# Patient Record
Sex: Male | Born: 1955
Health system: Southern US, Community
[De-identification: ages and names within clinical notes are randomized; demographics above are authoritative.]

## PROBLEM LIST (undated history)

## (undated) ENCOUNTER — Emergency Department (HOSPITAL_COMMUNITY): Discharge status: Absent without leave | Payer: No Typology Code available for payment source

## (undated) DIAGNOSIS — K219 Gastro-esophageal reflux disease without esophagitis: Secondary | ICD-10-CM

## (undated) DIAGNOSIS — B91 Sequelae of poliomyelitis: Principal | ICD-10-CM

## (undated) DIAGNOSIS — Z5189 Encounter for other specified aftercare: Secondary | ICD-10-CM

## (undated) DIAGNOSIS — I10 Essential (primary) hypertension: Secondary | ICD-10-CM

## (undated) DIAGNOSIS — F1910 Other psychoactive substance abuse, uncomplicated: Secondary | ICD-10-CM

## (undated) DIAGNOSIS — F528 Other sexual dysfunction not due to a substance or known physiological condition: Secondary | ICD-10-CM

## (undated) DIAGNOSIS — F419 Anxiety disorder, unspecified: Secondary | ICD-10-CM

## (undated) DIAGNOSIS — M79672 Pain in left foot: Secondary | ICD-10-CM

## (undated) DIAGNOSIS — T7840XA Allergy, unspecified, initial encounter: Secondary | ICD-10-CM

## (undated) DIAGNOSIS — B182 Chronic viral hepatitis C: Secondary | ICD-10-CM

## (undated) DIAGNOSIS — M79671 Pain in right foot: Secondary | ICD-10-CM

## (undated) HISTORY — PX: KNEE ARTHROSCOPY: SUR90

## (undated) HISTORY — PX: FRACTURE SURGERY: SHX138

## (undated) HISTORY — DX: Sequelae of poliomyelitis: B91

## (undated) HISTORY — DX: Encounter for other specified aftercare: Z51.89

## (undated) HISTORY — DX: Pain in left foot: M79.672

## (undated) HISTORY — PX: COLONOSCOPY: SHX174

## (undated) HISTORY — DX: Pain in right foot: M79.671

## (undated) HISTORY — DX: Allergy, unspecified, initial encounter: T78.40XA

## (undated) HISTORY — DX: Gastro-esophageal reflux disease without esophagitis: K21.9

## (undated) HISTORY — DX: Chronic viral hepatitis C: B18.2

## (undated) HISTORY — DX: Essential (primary) hypertension: I10

## (undated) HISTORY — DX: Other psychoactive substance abuse, uncomplicated: F19.10

## (undated) HISTORY — DX: Anxiety disorder, unspecified: F41.9

## (undated) HISTORY — DX: Other sexual dysfunction not due to a substance or known physiological condition: F52.8

---

## 1999-05-13 ENCOUNTER — Emergency Department (HOSPITAL_COMMUNITY): Admission: EM | Admit: 1999-05-13 | Discharge: 1999-05-13 | Payer: Self-pay

## 1999-06-01 ENCOUNTER — Encounter: Admission: RE | Admit: 1999-06-01 | Discharge: 1999-06-01 | Payer: Self-pay | Admitting: Family Medicine

## 2000-06-07 ENCOUNTER — Encounter: Admission: RE | Admit: 2000-06-07 | Discharge: 2000-06-07 | Payer: Self-pay | Admitting: Family Medicine

## 2000-06-07 ENCOUNTER — Encounter: Payer: Self-pay | Admitting: Family Medicine

## 2000-07-08 ENCOUNTER — Encounter: Admission: RE | Admit: 2000-07-08 | Discharge: 2000-07-08 | Payer: Self-pay | Admitting: Family Medicine

## 2001-03-13 ENCOUNTER — Encounter: Admission: RE | Admit: 2001-03-13 | Discharge: 2001-03-13 | Payer: Self-pay | Admitting: Family Medicine

## 2001-04-21 ENCOUNTER — Encounter: Admission: RE | Admit: 2001-04-21 | Discharge: 2001-04-21 | Payer: Self-pay | Admitting: Sports Medicine

## 2003-04-30 ENCOUNTER — Encounter: Admission: RE | Admit: 2003-04-30 | Discharge: 2003-04-30 | Payer: Self-pay | Admitting: Internal Medicine

## 2003-05-06 ENCOUNTER — Encounter: Admission: RE | Admit: 2003-05-06 | Discharge: 2003-05-06 | Payer: Self-pay | Admitting: Internal Medicine

## 2003-05-13 ENCOUNTER — Encounter: Admission: RE | Admit: 2003-05-13 | Discharge: 2003-05-13 | Payer: Self-pay | Admitting: Internal Medicine

## 2004-11-20 ENCOUNTER — Emergency Department (HOSPITAL_COMMUNITY): Admission: EM | Admit: 2004-11-20 | Discharge: 2004-11-20 | Payer: Self-pay | Admitting: Family Medicine

## 2005-01-08 ENCOUNTER — Encounter: Admission: RE | Admit: 2005-01-08 | Discharge: 2005-03-06 | Payer: Self-pay | Admitting: Neurology

## 2005-03-23 ENCOUNTER — Emergency Department (HOSPITAL_COMMUNITY): Admission: EM | Admit: 2005-03-23 | Discharge: 2005-03-23 | Payer: Self-pay | Admitting: Emergency Medicine

## 2005-03-30 ENCOUNTER — Ambulatory Visit: Payer: Self-pay | Admitting: Internal Medicine

## 2005-07-06 ENCOUNTER — Ambulatory Visit: Payer: Self-pay | Admitting: Internal Medicine

## 2005-07-09 ENCOUNTER — Ambulatory Visit: Payer: Self-pay | Admitting: Internal Medicine

## 2006-06-05 DIAGNOSIS — B182 Chronic viral hepatitis C: Secondary | ICD-10-CM

## 2006-06-05 DIAGNOSIS — B91 Sequelae of poliomyelitis: Secondary | ICD-10-CM

## 2006-06-05 DIAGNOSIS — F528 Other sexual dysfunction not due to a substance or known physiological condition: Secondary | ICD-10-CM

## 2006-06-05 DIAGNOSIS — I10 Essential (primary) hypertension: Secondary | ICD-10-CM

## 2006-06-05 DIAGNOSIS — L84 Corns and callosities: Secondary | ICD-10-CM

## 2006-06-05 HISTORY — DX: Other sexual dysfunction not due to a substance or known physiological condition: F52.8

## 2006-06-05 HISTORY — DX: Sequelae of poliomyelitis: B91

## 2006-06-05 HISTORY — DX: Essential (primary) hypertension: I10

## 2006-06-05 HISTORY — DX: Chronic viral hepatitis C: B18.2

## 2006-08-01 ENCOUNTER — Ambulatory Visit: Payer: Self-pay | Admitting: Internal Medicine

## 2006-08-01 ENCOUNTER — Encounter (INDEPENDENT_AMBULATORY_CARE_PROVIDER_SITE_OTHER): Payer: Self-pay | Admitting: Internal Medicine

## 2006-08-01 LAB — CONVERTED CEMR LAB
AST: 80 units/L — ABNORMAL HIGH (ref 0–37)
Albumin: 4.5 g/dL (ref 3.5–5.2)
Alkaline Phosphatase: 66 units/L (ref 39–117)
BUN: 22 mg/dL (ref 6–23)
Calcium: 9.8 mg/dL (ref 8.4–10.5)
Chloride: 104 meq/L (ref 96–112)
Glucose, Bld: 87 mg/dL (ref 70–99)
Potassium: 4.6 meq/L (ref 3.5–5.3)
Sodium: 142 meq/L (ref 135–145)
Total Protein: 8.3 g/dL (ref 6.0–8.3)

## 2006-12-05 ENCOUNTER — Encounter (INDEPENDENT_AMBULATORY_CARE_PROVIDER_SITE_OTHER): Payer: Self-pay | Admitting: Internal Medicine

## 2006-12-05 ENCOUNTER — Ambulatory Visit: Payer: Self-pay | Admitting: Internal Medicine

## 2007-03-26 ENCOUNTER — Ambulatory Visit: Payer: Self-pay | Admitting: Internal Medicine

## 2007-03-26 ENCOUNTER — Encounter (INDEPENDENT_AMBULATORY_CARE_PROVIDER_SITE_OTHER): Payer: Self-pay | Admitting: *Deleted

## 2007-03-27 LAB — CONVERTED CEMR LAB
BUN: 22 mg/dL (ref 6–23)
Chloride: 107 meq/L (ref 96–112)
Creatinine, Ser: 1.41 mg/dL (ref 0.40–1.50)

## 2007-08-19 ENCOUNTER — Telehealth (INDEPENDENT_AMBULATORY_CARE_PROVIDER_SITE_OTHER): Payer: Self-pay | Admitting: *Deleted

## 2007-08-22 ENCOUNTER — Telehealth (INDEPENDENT_AMBULATORY_CARE_PROVIDER_SITE_OTHER): Payer: Self-pay | Admitting: *Deleted

## 2007-09-19 ENCOUNTER — Telehealth: Payer: Self-pay | Admitting: *Deleted

## 2007-09-25 ENCOUNTER — Telehealth (INDEPENDENT_AMBULATORY_CARE_PROVIDER_SITE_OTHER): Payer: Self-pay | Admitting: *Deleted

## 2007-10-20 ENCOUNTER — Encounter (INDEPENDENT_AMBULATORY_CARE_PROVIDER_SITE_OTHER): Payer: Self-pay | Admitting: *Deleted

## 2007-10-20 ENCOUNTER — Ambulatory Visit: Payer: Self-pay | Admitting: Internal Medicine

## 2007-10-23 ENCOUNTER — Encounter (INDEPENDENT_AMBULATORY_CARE_PROVIDER_SITE_OTHER): Payer: Self-pay | Admitting: *Deleted

## 2007-10-23 ENCOUNTER — Ambulatory Visit: Payer: Self-pay | Admitting: Hospitalist

## 2007-10-24 ENCOUNTER — Telehealth (INDEPENDENT_AMBULATORY_CARE_PROVIDER_SITE_OTHER): Payer: Self-pay | Admitting: *Deleted

## 2007-10-26 LAB — CONVERTED CEMR LAB
ALT: 81 units/L — ABNORMAL HIGH (ref 0–53)
AST: 116 units/L — ABNORMAL HIGH (ref 0–37)
Albumin: 4.2 g/dL (ref 3.5–5.2)
Alkaline Phosphatase: 59 units/L (ref 39–117)
BUN: 14 mg/dL (ref 6–23)
Calcium: 9.4 mg/dL (ref 8.4–10.5)
Chloride: 106 meq/L (ref 96–112)
LDL Cholesterol: 66 mg/dL (ref 0–99)
Potassium: 4.2 meq/L (ref 3.5–5.3)
Sodium: 141 meq/L (ref 135–145)
Total Protein: 7.6 g/dL (ref 6.0–8.3)

## 2007-12-23 ENCOUNTER — Ambulatory Visit: Payer: Self-pay | Admitting: Gastroenterology

## 2008-01-01 ENCOUNTER — Telehealth: Payer: Self-pay | Admitting: Gastroenterology

## 2008-01-05 ENCOUNTER — Ambulatory Visit: Payer: Self-pay | Admitting: Gastroenterology

## 2008-01-15 ENCOUNTER — Ambulatory Visit: Payer: Self-pay | Admitting: Internal Medicine

## 2008-01-15 ENCOUNTER — Encounter (INDEPENDENT_AMBULATORY_CARE_PROVIDER_SITE_OTHER): Payer: Self-pay | Admitting: *Deleted

## 2008-01-19 LAB — CONVERTED CEMR LAB
ALT: 122 units/L — ABNORMAL HIGH (ref 0–53)
AST: 103 units/L — ABNORMAL HIGH (ref 0–37)
Albumin: 4.3 g/dL (ref 3.5–5.2)
CO2: 23 meq/L (ref 19–32)
Calcium: 9.2 mg/dL (ref 8.4–10.5)
Chloride: 109 meq/L (ref 96–112)
Creatinine, Ser: 1.24 mg/dL (ref 0.40–1.50)
Potassium: 3.7 meq/L (ref 3.5–5.3)
Sodium: 145 meq/L (ref 135–145)
Total Protein: 7.5 g/dL (ref 6.0–8.3)

## 2008-01-26 LAB — CONVERTED CEMR LAB: HCV Quantitative: 213000 intl units/mL — ABNORMAL HIGH (ref <43)

## 2008-02-03 ENCOUNTER — Ambulatory Visit (HOSPITAL_COMMUNITY): Admission: RE | Admit: 2008-02-03 | Discharge: 2008-02-03 | Payer: Self-pay | Admitting: *Deleted

## 2008-02-19 ENCOUNTER — Ambulatory Visit: Payer: Self-pay | Admitting: Internal Medicine

## 2008-03-01 ENCOUNTER — Telehealth: Payer: Self-pay | Admitting: Internal Medicine

## 2008-03-23 ENCOUNTER — Emergency Department (HOSPITAL_COMMUNITY): Admission: EM | Admit: 2008-03-23 | Discharge: 2008-03-23 | Payer: Self-pay | Admitting: Emergency Medicine

## 2008-06-29 ENCOUNTER — Encounter (INDEPENDENT_AMBULATORY_CARE_PROVIDER_SITE_OTHER): Payer: Self-pay | Admitting: Internal Medicine

## 2008-06-29 ENCOUNTER — Ambulatory Visit (HOSPITAL_COMMUNITY): Admission: RE | Admit: 2008-06-29 | Discharge: 2008-06-29 | Payer: Self-pay | Admitting: Internal Medicine

## 2008-06-29 ENCOUNTER — Ambulatory Visit: Payer: Self-pay | Admitting: Internal Medicine

## 2008-07-01 LAB — CONVERTED CEMR LAB
ALT: 88 units/L — ABNORMAL HIGH (ref 0–53)
Alkaline Phosphatase: 61 units/L (ref 39–117)
CO2: 28 meq/L (ref 19–32)
Creatinine, Ser: 1.19 mg/dL (ref 0.40–1.50)
Hemoglobin: 13.6 g/dL (ref 13.0–17.0)
INR: 1.1 (ref 0.0–1.5)
MCHC: 32.5 g/dL (ref 30.0–36.0)
Platelets: 259 10*3/uL (ref 150–400)
RDW: 13.7 % (ref 11.5–15.5)
TSH: 2.063 microintl units/mL (ref 0.350–4.50)
Total Bilirubin: 0.4 mg/dL (ref 0.3–1.2)

## 2008-07-19 ENCOUNTER — Encounter (INDEPENDENT_AMBULATORY_CARE_PROVIDER_SITE_OTHER): Payer: Self-pay | Admitting: *Deleted

## 2008-07-19 ENCOUNTER — Telehealth: Payer: Self-pay | Admitting: Infectious Diseases

## 2008-07-19 ENCOUNTER — Telehealth: Payer: Self-pay | Admitting: *Deleted

## 2008-07-19 ENCOUNTER — Ambulatory Visit (HOSPITAL_COMMUNITY): Admission: RE | Admit: 2008-07-19 | Discharge: 2008-07-19 | Payer: Self-pay | Admitting: Infectious Diseases

## 2008-07-19 ENCOUNTER — Ambulatory Visit: Payer: Self-pay | Admitting: Infectious Diseases

## 2008-07-19 ENCOUNTER — Encounter (INDEPENDENT_AMBULATORY_CARE_PROVIDER_SITE_OTHER): Payer: Self-pay | Admitting: Internal Medicine

## 2008-07-19 DIAGNOSIS — F1010 Alcohol abuse, uncomplicated: Secondary | ICD-10-CM | POA: Insufficient documentation

## 2008-07-19 LAB — CONVERTED CEMR LAB
BUN: 16 mg/dL (ref 6–23)
CO2: 26 meq/L (ref 19–32)
Calcium: 9.7 mg/dL (ref 8.4–10.5)
Creatinine, Ser: 1.17 mg/dL (ref 0.40–1.50)
Eosinophils Absolute: 0.1 10*3/uL (ref 0.0–0.7)
Eosinophils Relative: 1 % (ref 0–5)
Glucose, Bld: 86 mg/dL (ref 70–99)
HCT: 41.6 % (ref 39.0–52.0)
Hemoglobin: 13.7 g/dL (ref 13.0–17.0)
Lymphocytes Relative: 34 % (ref 12–46)
Lymphs Abs: 2.1 10*3/uL (ref 0.7–4.0)
MCV: 90.8 fL (ref 78.0–100.0)
Monocytes Absolute: 0.5 10*3/uL (ref 0.1–1.0)
RDW: 13.8 % (ref 11.5–15.5)
WBC: 6 10*3/uL (ref 4.0–10.5)

## 2008-07-21 ENCOUNTER — Ambulatory Visit: Payer: Self-pay | Admitting: Cardiology

## 2008-07-21 ENCOUNTER — Ambulatory Visit: Payer: Self-pay | Admitting: Vascular Surgery

## 2008-07-21 ENCOUNTER — Encounter: Payer: Self-pay | Admitting: Infectious Diseases

## 2008-07-21 ENCOUNTER — Ambulatory Visit (HOSPITAL_COMMUNITY): Admission: RE | Admit: 2008-07-21 | Discharge: 2008-07-21 | Payer: Self-pay | Admitting: Infectious Diseases

## 2008-07-22 ENCOUNTER — Ambulatory Visit (HOSPITAL_COMMUNITY): Admission: RE | Admit: 2008-07-22 | Discharge: 2008-07-22 | Payer: Self-pay | Admitting: Infectious Diseases

## 2008-07-26 ENCOUNTER — Ambulatory Visit: Payer: Self-pay | Admitting: Internal Medicine

## 2008-07-26 ENCOUNTER — Telehealth: Payer: Self-pay | Admitting: Licensed Clinical Social Worker

## 2008-07-26 DIAGNOSIS — F17210 Nicotine dependence, cigarettes, uncomplicated: Secondary | ICD-10-CM | POA: Insufficient documentation

## 2008-07-28 ENCOUNTER — Telehealth (INDEPENDENT_AMBULATORY_CARE_PROVIDER_SITE_OTHER): Payer: Self-pay | Admitting: Internal Medicine

## 2008-08-09 ENCOUNTER — Encounter (INDEPENDENT_AMBULATORY_CARE_PROVIDER_SITE_OTHER): Payer: Self-pay | Admitting: Internal Medicine

## 2008-09-28 ENCOUNTER — Telehealth: Payer: Self-pay | Admitting: *Deleted

## 2008-10-13 ENCOUNTER — Ambulatory Visit: Payer: Self-pay | Admitting: *Deleted

## 2008-10-13 ENCOUNTER — Encounter (INDEPENDENT_AMBULATORY_CARE_PROVIDER_SITE_OTHER): Payer: Self-pay | Admitting: Internal Medicine

## 2008-10-13 DIAGNOSIS — R740 Nonspecific elevation of levels of transaminase and lactic acid dehydrogenase [LDH]: Secondary | ICD-10-CM

## 2008-10-13 DIAGNOSIS — R7401 Elevation of levels of liver transaminase levels: Secondary | ICD-10-CM | POA: Insufficient documentation

## 2008-10-13 LAB — CONVERTED CEMR LAB
ALT: 102 units/L — ABNORMAL HIGH (ref 0–53)
Ammonia: 37 umol/L — ABNORMAL HIGH (ref 11–35)
BUN: 18 mg/dL (ref 6–23)
CO2: 27 meq/L (ref 19–32)
Calcium: 10.1 mg/dL (ref 8.4–10.5)
Chloride: 106 meq/L (ref 96–112)
Creatinine, Ser: 1.29 mg/dL (ref 0.40–1.50)
Glucose, Bld: 81 mg/dL (ref 70–99)

## 2008-10-25 ENCOUNTER — Encounter: Admission: RE | Admit: 2008-10-25 | Discharge: 2008-12-08 | Payer: Self-pay | Admitting: Internal Medicine

## 2008-11-08 ENCOUNTER — Encounter (INDEPENDENT_AMBULATORY_CARE_PROVIDER_SITE_OTHER): Payer: Self-pay | Admitting: Internal Medicine

## 2008-12-01 ENCOUNTER — Encounter (INDEPENDENT_AMBULATORY_CARE_PROVIDER_SITE_OTHER): Payer: Self-pay | Admitting: Internal Medicine

## 2008-12-09 ENCOUNTER — Ambulatory Visit: Payer: Self-pay | Admitting: Gastroenterology

## 2008-12-09 ENCOUNTER — Encounter (INDEPENDENT_AMBULATORY_CARE_PROVIDER_SITE_OTHER): Payer: Self-pay | Admitting: Internal Medicine

## 2008-12-16 ENCOUNTER — Encounter (INDEPENDENT_AMBULATORY_CARE_PROVIDER_SITE_OTHER): Payer: Self-pay | Admitting: Internal Medicine

## 2009-02-10 ENCOUNTER — Ambulatory Visit: Payer: Self-pay | Admitting: Gastroenterology

## 2009-02-10 ENCOUNTER — Encounter: Payer: Self-pay | Admitting: Internal Medicine

## 2009-03-01 ENCOUNTER — Telehealth: Payer: Self-pay | Admitting: *Deleted

## 2009-06-30 ENCOUNTER — Encounter: Payer: Self-pay | Admitting: Internal Medicine

## 2009-06-30 ENCOUNTER — Ambulatory Visit: Payer: Self-pay | Admitting: Internal Medicine

## 2009-09-30 ENCOUNTER — Ambulatory Visit: Payer: Self-pay | Admitting: Internal Medicine

## 2009-09-30 DIAGNOSIS — M62838 Other muscle spasm: Secondary | ICD-10-CM | POA: Insufficient documentation

## 2009-09-30 LAB — CONVERTED CEMR LAB
Chloride: 105 meq/L (ref 96–112)
Creatinine, Ser: 1.23 mg/dL (ref 0.40–1.50)
Potassium: 4 meq/L (ref 3.5–5.3)
Sodium: 141 meq/L (ref 135–145)

## 2009-12-27 ENCOUNTER — Telehealth: Payer: Self-pay | Admitting: Internal Medicine

## 2010-01-27 ENCOUNTER — Ambulatory Visit: Payer: Self-pay | Admitting: Internal Medicine

## 2010-02-17 ENCOUNTER — Ambulatory Visit: Payer: Self-pay | Admitting: Internal Medicine

## 2010-02-22 ENCOUNTER — Telehealth: Payer: Self-pay | Admitting: Licensed Clinical Social Worker

## 2010-02-24 ENCOUNTER — Telehealth: Payer: Self-pay | Admitting: Internal Medicine

## 2010-02-27 ENCOUNTER — Encounter: Payer: Self-pay | Admitting: Licensed Clinical Social Worker

## 2010-02-28 ENCOUNTER — Telehealth: Payer: Self-pay | Admitting: *Deleted

## 2010-03-11 ENCOUNTER — Emergency Department (HOSPITAL_COMMUNITY): Admission: EM | Admit: 2010-03-11 | Discharge: 2010-03-11 | Payer: Self-pay | Admitting: Emergency Medicine

## 2010-03-12 ENCOUNTER — Emergency Department (HOSPITAL_COMMUNITY): Admission: EM | Admit: 2010-03-12 | Discharge: 2010-03-12 | Payer: Self-pay | Admitting: Emergency Medicine

## 2010-03-28 ENCOUNTER — Telehealth: Payer: Self-pay | Admitting: *Deleted

## 2010-05-22 ENCOUNTER — Telehealth: Payer: Self-pay | Admitting: Internal Medicine

## 2010-06-19 ENCOUNTER — Telehealth: Payer: Self-pay | Admitting: *Deleted

## 2010-07-24 ENCOUNTER — Telehealth: Payer: Self-pay | Admitting: Internal Medicine

## 2010-08-24 NOTE — Assessment & Plan Note (Signed)
Summary: EST-3 WEEK RECHECK/CH   Vital Signs:  Patient profile:   55 year old male Height:      72 inches (182.88 cm) Weight:      167.8 pounds (76.27 kg) BMI:     22.84 Temp:     98.1 degrees F oral Pulse rate:   81 / minute BP sitting:   142 / 97  (right arm)  Vitals Entered By: Chinita Pester RN (February 17, 2010 11:39 AM) CC: 2 week f/u. States Flexeril  might be causing h/a's. Is Patient Diabetic? No Pain Assessment Patient in pain? yes     Location: left leg Intensity: 4 Type: dull Onset of pain  Chronic - hx polio Nutritional Status BMI of 19 -24 = normal  Have you ever been in a relationship where you felt threatened, hurt or afraid?No   Does patient need assistance? Functional Status Self care Ambulation Impaired:Risk for fall Comments uses a cane   Primary Care Provider:  Lars Mage MD  CC:  2 week f/u. States Flexeril  might be causing h/a's..  History of Present Illness: Patient is a 55 year old man with PMH as described in EMR is here today for follow up appointment of his pain and HTN. He was last seen 3 weeks ago when he was c/o increased pain in his affected leg and was started on Flexeril which he did not refill earlier.  Patient said that he has been having headaches 2-3/10. He has it everytime he takes his flexeril. The pain is described as aching/niggling type of pain. No radiation. no releiving factors, aggravated by flexeril.  He thinks that the headache is getting better progressively as he is tolerating the drug better.  The Vicodin prescribed for her pain in the leg is working fine and he just took a pill before coming to clinic.   Bp is elevated today. He is taking HCTZ and lisinopril and HCTZ was started recently on last visit.  He is still smoking 10 cigs a day and wants to quit. he is agreeable to see Francene Finders for counselling.   He denies any new sicknesses or hospitalizations, no chest pain episodes, no fevers, no chills, no  abdominal or urinary concerns. No recent changes in appetite, weight.     Depression History:      The patient denies a depressed mood most of the day and a diminished interest in his usual daily activities.         Preventive Screening-Counseling & Management  Alcohol-Tobacco     Alcohol drinks/day: 1     Alcohol type: BEER      Smoking Status: current     Smoking Cessation Counseling: yes     Packs/Day: 1/2     Passive Smoke Exposure: no  Caffeine-Diet-Exercise     Does Patient Exercise: yes     Type of exercise: WALKING     Times/week: 7  Problems Prior to Update: 1)  Degenerative Joint Disease  (ICD-715.90) 2)  Spasm of Muscle  (ICD-728.85) 3)  Preventive Health Care  (ICD-V70.0) 4)  Foot Pain, Bilateral  (ICD-729.5) 5)  Calluses, Feet, Bilateral  (ICD-700) 6)  Transaminases, Serum, Elevated  (ICD-790.4) 7)  Tobacco Abuse  (ICD-305.1) 8)  Alcohol Abuse  (ICD-305.00) 9)  Weakness  (ICD-780.79) 10)  Callus  (ICD-700) 11)  Ankle Pain, Right  (ICD-719.47) 12)  Foot Pain, Left  (ICD-729.5) 13)  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 Take 1 Tablet By Mouth Once A Day 2)  Folic Acid 1 Mg Tabs (Folic Acid) .... Take 1 Tablet By Mouth Once A Day 3)  Lisinopril 20 Mg Tabs (Lisinopril) .... Take 1 Tablet By Mouth Two Times A Day 4)  Vicodin 5-500 Mg Tabs (Hydrocodone-Acetaminophen) .... Take 1 Pill Every 6 Hours As Needed For Pain 5)  Flexeril 10 Mg Tabs (Cyclobenzaprine Hcl) .... Take One Pill By Mouth At Bedtime For 2 Weeks, Then Increase To One Pill By Mouth Two Times A Day 6)  Hydrochlorothiazide 25 Mg Tabs (Hydrochlorothiazide) .... Take One Pill By Mouth Once Daily  Current Medications (verified): 1)  Vitamin B-1 100 Mg Tabs (Thiamine Hcl) .... Take 1 Tablet By Mouth Once A Day 2)  Folic Acid 1 Mg  Tabs (Folic Acid) .... Take 1 Tablet By Mouth Once A Day 3)  Lisinopril 20 Mg Tabs (Lisinopril) .... Take 2 Pill in The Morning. 4)  Vicodin 5-500 Mg Tabs (Hydrocodone-Acetaminophen) .... Take 1 Pill Every 6 Hours As Needed For Pain 5)  Flexeril 10 Mg Tabs (Cyclobenzaprine Hcl) .... Take One Pill By Mouth At Bedtime For 2 Weeks, Then Increase To One Pill By Mouth Two Times A Day 6)  Hydrochlorothiazide 25 Mg Tabs (Hydrochlorothiazide) .... Take One Pill By Mouth Once Daily  Allergies (verified): No Known Drug Allergies  Past History:  Past Medical History: Last updated: 07/26/2008 erectile dysfunction polio-age 55 S/p foot surgery Mole on his right inner thigh, 1/3 of way between knee and thigh Alcohol dependence  Family History: Last updated: 2006/09/29 M-died of brain Ca, No known h/o colon Ca, CAD, DM but did not know his father and does not know anything about his grandparents  Social History: Last updated: 07/26/2008 drinks at least 40 ounces of beer/day; smokes 1/2ppd  Risk Factors: Alcohol Use: 1 (02/17/2010) Exercise: yes (02/17/2010)  Risk Factors: Smoking Status: current (02/17/2010) Packs/Day: 1/2 (02/17/2010) Passive Smoke Exposure: no (02/17/2010)  Family History: Reviewed history from 09-29-06 and no changes required. M-died of brain Ca, No known h/o colon Ca, CAD, DM but did not know his father and does not know anything about his grandparents  Social History: Reviewed history from 07/26/2008 and no changes required. drinks at least 40 ounces of beer/day; smokes 1/2ppd  Review of Systems      See HPI  Physical Exam  Additional Exam:  Gen: AOx3, in no acute distress Eyes: PERRL, EOMI ENT:MMM, No erythema noted in posterior pharynx Neck: No JVD, No LAP Chest: CTAB with  good respiratory effort CVS: regular rhythmic rate, NO M/R/G, S1 S2 normal Abdo: soft,ND, BS+x4, Non tender and No hepatosplenomegaly EXT: No odema   Impression &  Recommendations:  Problem # 1:  FOOT PAIN, BILATERAL (ICD-729.5) Assessment Improved He is tolerating flexeril progressively better and says that he will start taking it in the morning as well for muscle relaxation. He is well controlled on current regimen of drugs and has requested to increase his pain meds at times to 4 pills a day. If he continues to have pain which is not controlled by this new regimen, I would start him on increased doses of vicodin.  Problem # 2:  TOBACCO ABUSE (ICD-305.1) Assessment: Unchanged Referred to Lup[facility] today for couselling. Patient was counseled on smoking cessation strategies including medications and behavior modification options.  Orders: Social Work Referral (Social )  Problem # 3:  HYPERTENSION (ICD-401.9) Assessment: Improved BP is improved as compared to last visit with addition of HCTZ. I will continue to monitor ask him to make dietary changes to control it further.  His updated medication list for this problem includes:    Lisinopril 20 Mg Tabs (Lisinopril) .Marland Kitchen... Take 2 pill in the morning.    Hydrochlorothiazide 25 Mg Tabs (Hydrochlorothiazide) .Marland Kitchen... Take one pill by mouth once daily  BP today: 142/97 Prior BP: 156/97 (01/27/2010)  Labs Reviewed: K+: 4.0 (09/30/2009) Creat: : 1.23 (09/30/2009)   Chol: 139 (10/23/2007)   HDL: 51 (10/23/2007)   LDL: 66 (10/23/2007)   TG: 111 (10/23/2007)  Problem # 4:  HEPATITIS C (ICD-070.51) Assessment: Comment Only Patient was being seen at hepatitis C clinic untill last year. He had recieved treatment for Hep C in [place] which was inturrupted, the details of which are not known. I will refer him to hepatitis clinic again for follow up. No new complaints noted.  Orders: Hepatitis C Clinic Referral (HepC)  Problem # 5:  Preventive Health Care (ICD-V70.0) Assessment: Comment Only  Reviewed preventive care protocols, scheduled due services, and updated immunizations.  Complete Medication List: 1)   Vitamin B-1 100 Mg Tabs (Thiamine hcl) .... Take 1 tablet by mouth once a day 2)  Folic Acid 1 Mg Tabs (Folic acid) .... Take 1 tablet by mouth once a day 3)  Lisinopril 20 Mg Tabs (Lisinopril) .... Take 2 pill in the morning. 4)  Vicodin 5-500 Mg Tabs (Hydrocodone-acetaminophen) .... Take 1 pill every 6 hours as needed for pain 5)  Flexeril 10 Mg Tabs (Cyclobenzaprine hcl) .... Take one pill by mouth at bedtime for 2 weeks, then increase to one pill by mouth two times a day 6)  Hydrochlorothiazide 25 Mg Tabs (Hydrochlorothiazide) .... Take one pill by mouth once daily  Patient Instructions: 1)  Please schedule a follow-up appointment in 6 months. 2)  You will be called up by our social worker for smoking cessation counselling. 3)  Tobacco is very bad for your health and your loved ones! You Should stop smoking!. 4)  Stop Smoking Tips: Choose a Quit date. Cut down before the Quit date. decide what you will do as a substitute when you feel the urge to smoke(gum,toothpick,exercise). 5)  Take an Aspirin every day. 6)  Check your Blood Pressure regularly. If it is above: 160/100 you should make an appointment. Prescriptions: LISINOPRIL 20 MG TABS (LISINOPRIL) Take 2 pill in the morning.  #30 x 11   Entered and Authorized by:   Lars Mage MD   Signed by:   Lars Mage MD on 02/17/2010   Method used:   Electronically to        [facility] [phone]* (retail)       [address]       [place], [place]  [address]       Ph: [phone]       Fax: [phone]   RxID:   [id number]   Prevention & Chronic Care Immunizations   Influenza vaccine: Fluvax 3+  (06/30/2009)   Influenza vaccine deferral: Deferred  (01/27/2010)    Tetanus booster: 06/22/2000: Done.    Pneumococcal vaccine: Not documented  Colorectal Screening   Hemoccult: Not documented   Hemoccult action/deferral: Not indicated  (01/27/2010)    Colonoscopy: Location:  [facility].    (01/05/2008)    Colonoscopy due: 12/2017  Other Screening   PSA: Not documented   PSA  action/deferral: Discussion deferred  (01/27/2010)   Smoking status: current  (02/17/2010)   Smoking cessation counseling: yes  (02/17/2010)  Lipids   Total Cholesterol: 139  (10/23/2007)   LDL: 66  (10/23/2007)   LDL Direct: Not documented   HDL: 51  (10/23/2007)   Triglycerides: 111  (10/23/2007)  Hypertension   Last Blood Pressure: 142 / 97  (02/17/2010)   Serum creatinine: 1.23  (09/30/2009)   Serum potassium 4.0  (09/30/2009)    Hypertension flowsheet reviewed?: Yes   Progress toward BP goal: Improved  Self-Management Support :   Personal Goals (by the next clinic visit) :      Personal blood pressure goal: 140/90  (06/30/2009)   Patient will work on the following items until the next clinic visit to reach self-care goals:     Medications and monitoring: take my medicines every day, check my blood pressure, bring all of my medications to every visit  (02/17/2010)     Eating: eat foods that are low in salt, eat baked foods instead of fried foods  (02/17/2010)     Activity: take a 30 minute walk every day  (02/17/2010)    Hypertension self-management support: Written self-care plan  (02/17/2010)   Hypertension self-care plan printed.

## 2010-08-24 NOTE — Progress Notes (Signed)
Summary: phone note/gp  Phone Note Outgoing Call   Summary of Call: I talked to Tobi Bastos at the Hep C clinic about scheduling pt. for a f/u appt.  She said their NP retired and have only 1 doctor who see pts. once a week. She will put pt.'s name on a waitlist and call the pt. Pt. was called and made awared. Initial call taken by: Chinita Pester RN,  February 28, 2010 1:19 PM

## 2010-08-24 NOTE — Progress Notes (Signed)
Summary: med refill/gp  Phone Note Refill Request Message from:  Fax from Pharmacy on December 27, 2009 3:01 PM  Refills Requested: Medication #1:  LISINOPRIL 40 MG TABS take on pill by mouth once daily   Dosage confirmed as above?Dosage Confirmed   Brand Name Necessary? No   Supply Requested: 1 year  Medication #2:  HYDROCHLOROTHIAZIDE 25 MG TABS take one pill by mouth once daily.   Dosage confirmed as above?Dosage Confirmed   Brand Name Necessary? No   Supply Requested: 1 year  Medication #3:  VICODIN 5-500 MG TABS take 1 pill every 6 hours as needed for pain   Dosage confirmed as above?Dosage Confirmed   Brand Name Necessary? No   Supply Requested: 6 months Last appt. March 05/2010.   Method Requested: Electronic Initial call taken by: Chinita Pester RN,  December 27, 2009 3:02 PM  Follow-up for Phone Call        Refill approved-nurse to complete. Follow-up by: Lars Mage MD,  December 29, 2009 2:02 PM  Additional Follow-up for Phone Call Additional follow up Details #1::        Rxs called to pharmacy [facility] pharmacy. Additional Follow-up by: Chinita Pester RN,  December 30, 2009 9:15 AM    Prescriptions: HYDROCHLOROTHIAZIDE 25 MG TABS (HYDROCHLOROTHIAZIDE) take one pill by mouth once daily  #30 x 11   Entered and Authorized by:   Lars Mage MD   Signed by:   Chinita Pester RN on 12/30/2009   Method used:   Telephoned to ...       [facility] [address] [phone]* (retail)       [address]       [place], [place]  [address]       Ph: [phone]       Fax: [phone]   RxID:   [id number] VICODIN 5-500 MG TABS (HYDROCODONE-ACETAMINOPHEN) take 1 pill every 6 hours as needed for pain  #90 x 5   Entered and Authorized by:   Lars Mage MD   Signed by:   Chinita Pester RN on 12/30/2009   Method used:   Telephoned to ...       [facility] [address] [phone]* (retail)       [address]       [place], [place]  [address]       Ph: [phone]       Fax: [phone]   RxID:    [id number] LISINOPRIL 40 MG TABS (LISINOPRIL) take on pill by mouth once daily  #30 x 11   Entered and Authorized by:   Lars Mage MD   Signed by:   Chinita Pester RN on 12/30/2009   Method used:   Telephoned to ...       [facility] [address] [phone]* (retail)       [address]       [place], [place]  [address]       Ph: [phone]       Fax: [phone]   RxID:   [id number]

## 2010-08-24 NOTE — Progress Notes (Signed)
Summary: refill/gg  Phone Note Refill Request  on May 22, 2010 12:45 PM  Refills Requested: Medication #1:  FLEXERIL 10 MG TABS Take one pill by mouth at bedtime for 2 weeks   Dosage confirmed as above?Dosage Confirmed   Brand Name Necessary? No   Supply Requested: 1 month   Last Refilled: 01/28/2010  Medication #2:  VICODIN 5-500 MG TABS take 1 pill every 6 hours as needed for pain   Dosage confirmed as above?Dosage Confirmed   Brand Name Necessary? No   Supply Requested: 3 months   Last Refilled: 03/29/2010 Pt # [id number]   Method Requested: Electronic Initial call taken by: Merrie Roof RN,  May 22, 2010 12:45 PM  Follow-up for Phone Call        Refill approved-nurse to complete Follow-up by: Lars Mage MD,  May 22, 2010 2:14 PM  Additional Follow-up for Phone Call Additional follow up Details #1::        Rx called to pharmacy - [facility] [address] Additional Follow-up by: Chinita Pester RN,  May 24, 2010 10:19 AM    Prescriptions: FLEXERIL 10 MG TABS (CYCLOBENZAPRINE HCL) Take one pill by mouth at bedtime for 2 weeks, then increase to one pill by mouth two times a day  #60 x 3   Entered and Authorized by:   Lars Mage MD   Signed by:   Lars Mage MD on 05/22/2010   Method used:   Telephoned to ...       [facility] [address] [phone]* (retail)       [address]       [place], [place]  [address]       Ph: [phone]       Fax: [phone]   RxID:   [id number] VICODIN 5-500 MG TABS (HYDROCODONE-ACETAMINOPHEN) take 1 pill every 6 hours as needed for pain  #90 x 0   Entered and Authorized by:   Lars Mage MD   Signed by:   Lars Mage MD on 05/22/2010   Method used:   Telephoned to ...       [facility] [address] [phone]* (retail)       [address]       [place], [place]  [address]       Ph: [phone]       Fax: [phone]   RxID:   [id number]

## 2010-08-24 NOTE — Progress Notes (Signed)
Summary: refill/ hla  Phone Note Refill Request Message from:  Patient on June 19, 2010 5:17 PM  Refills Requested: Medication #1:  VICODIN 5-500 MG TABS take 1 pill every 6 hours as needed for pain   Dosage confirmed as above?Dosage Confirmed   Brand Name Necessary? No   Supply Requested: 1 month   Last Refilled: 10/31  Medication #2:  FLEXERIL 10 MG TABS Take one pill by mouth at bedtime for 2 weeks   Dosage confirmed as above?Dosage Confirmed   Brand Name Necessary? No   Supply Requested: 1 month   Last Refilled: 10/31 last visit 7/29  Initial call taken by: Marin Roberts RN,  June 19, 2010 5:18 PM  Follow-up for Phone Call        Schedule a follow visit for possible non narcotic treatment options for his pain. Refill approved for 1 month. Please call in the prescriptions.  Thank you Follow-up by: Lars Mage MD,  June 23, 2010 12:17 PM  Additional Follow-up for Phone Call Additional follow up Details #1::        Rx called to pharmacy Additional Follow-up by: Marin Roberts RN,  June 23, 2010 2:35 PM    Prescriptions: FLEXERIL 10 MG TABS (CYCLOBENZAPRINE HCL) Take one pill by mouth at bedtime for 2 weeks, then increase to one pill by mouth two times a day  #60 x 3   Entered and Authorized by:   Lars Mage MD   Signed by:   Lars Mage MD on 06/23/2010   Method used:   Telephoned to ...       [facility] [address] [phone]* (retail)       [address]       [place], [place]  [address]       Ph: [phone]       Fax: [phone]   RxID:   [id number] VICODIN 5-500 MG TABS (HYDROCODONE-ACETAMINOPHEN) take 1 pill every 6 hours as needed for pain  #90 x 0   Entered and Authorized by:   Lars Mage MD   Signed by:   Lars Mage MD on 06/23/2010   Method used:   Telephoned to ...       [facility] [address] [phone]* (retail)       [address]       [place], [place]  [address]       Ph: [phone]       Fax: [phone]   RxID:   [id number]

## 2010-08-24 NOTE — Progress Notes (Signed)
Summary: Refill/gh  Phone Note Refill Request Message from:  Patient on July 24, 2010 11:16 AM  Refills Requested: Medication #1:  VICODIN 5-500 MG TABS take 1 pill every 6 hours as needed for pain   Brand Name Necessary? No   Supply Requested: 3 months   Last Refilled: 06/23/2010  Method Requested: Electronic Initial call taken by: Angelina Ok RN,  July 24, 2010 11:16 AM  Follow-up for Phone Call        Refill approved-nurse to complete. Please call in the prescription to required pharmacy.  thanks Follow-up by: Lars Mage MD,  July 24, 2010 3:00 PM  Additional Follow-up for Phone Call Additional follow up Details #1::        Rx faxed to pharmacy Additional Follow-up by: Angelina Ok RN,  July 24, 2010 5:36 PM    Prescriptions: VICODIN 5-500 MG TABS (HYDROCODONE-ACETAMINOPHEN) take 1 pill every 6 hours as needed for pain  #90 x 3   Entered and Authorized by:   Lars Mage MD   Signed by:   Lars Mage MD on 07/24/2010   Method used:   Telephoned to ...       [facility] [address] [phone]* (retail)       [address]       [place], [place]  [address]       Ph: [phone]       Fax: [phone]   RxID:   [id number]

## 2010-08-24 NOTE — Assessment & Plan Note (Signed)
Summary: EST-CK/FU/MEDS/CFB   Vital Signs:  Patient profile:   55 year old male Height:      72 inches (182.88 cm) Weight:      174.3 pounds (79.23 kg) BMI:     23.72 Temp:     97.6 degrees F (36.44 degrees C) oral Pulse rate:   71 / minute BP sitting:   150 / 97  (left arm) Cuff size:   regular  Vitals Entered By: Krystal Eaton Duncan Dull) (September 30, 2009 2:58 PM) CC: vicodin not working helping left hip pain, f/u bp, questions about arthritis Is Patient Diabetic? No Pain Assessment Patient in pain? yes     Location: left hip Intensity: 8 Type: sharp Onset of pain  Chronic so long cant remember Nutritional Status BMI of 19 -24 = normal  Have you ever been in a relationship where you felt threatened, hurt or afraid?No   Does patient need assistance? Functional Status Self care Ambulation Normal   Primary Care Provider:  Lars Mage MD  CC:  vicodin not working helping left hip pain, f/u bp, and questions about arthritis.  History of Present Illness: Pt is a 55 yo male w/ PMH outlined in the EMR who presents today for c/o foot pain and f/u on chronic medical conditions  1. Foot pain: pt with chronic foot pain 2/2 damage from Polio as a child, L foot > R.  For many years, pain was well controlled on Ultram; he was transitioned to Vicodin at his last appt.  Pt reports pain is well controlled with current vicodin dose and he is able to work without difficulty; however he is now experiencing painful muscle spams in his feet.   This is not a new symptom for him but has not been problematic for many years. **Please note the pt signed a pain contract at his last visit when he was transitioned from ultram to vicodin.  2. HTN: pt reports compliance with all medications.   He reports his BP has been high the last few times he checked it at [facility] with SBPs of 150-180s.  These reported reading are higher than those from his last visit, before he was started the combination of  lisinopril-hctz.  He missed his one month f/u for BP check after his previous visit in 06/2009 when the changes to his med regimen were made.   He denies any HA, visual changes, dizziness, syncope,C/P,SOB, DOE, or neurological symptoms.  3. Hep C: stable.  Pt denies any abdominal pain, dark colored urine, or jaunice.  4. Tobacco use:  pt smoking 1/2-1ppd.  Does not plan to quit in the near future.  flexeril naprosyn increase lisinopril f/u in 1 mo record bps avoid salty foods      Preventive Screening-Counseling & Management  Alcohol-Tobacco     Alcohol drinks/day: 1     Alcohol type: BEER      Smoking Status: current     Smoking Cessation Counseling: yes     Packs/Day: 1/2     Passive Smoke Exposure: no  Current Problems (verified): 1)  Spasm of Muscle  (ICD-728.85) 2)  Preventive Health Care  (ICD-V70.0) 3)  Foot Pain, Bilateral  (ICD-729.5) 4)  Calluses, Feet, Bilateral  (ICD-700) 5)  Transaminases, Serum, Elevated  (ICD-790.4) 6)  Tobacco Abuse  (ICD-305.1) 7)  Alcohol Abuse  (ICD-305.00) 8)  Weakness  (ICD-780.79) 9)  Callus  (ICD-700) 10)  Ankle Pain, Right  (ICD-719.47) 11)  Foot Pain, Left  (ICD-729.5) 12)  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 Take 1 Tablet By Mouth Once A Day 3)  Folic Acid 1 Mg Tabs (Folic Acid) .... Take 1 Tablet By Mouth Once A Day 4)  Lisinopril 40 Mg Tabs (Lisinopril) .... Take On Pill By Mouth Once Daily 5)  Vicodin 5-500 Mg Tabs (Hydrocodone-Acetaminophen) .... Take 1 Pill Every 6 Hours As Needed For Pain 6)  Naproxen 375 Mg Tabs (Naproxen) .... Take One Pill By Mouth Two Times A Day 7)  Flexeril 10 Mg Tabs (Cyclobenzaprine Hcl) .... Take One Pill By Mouth At Bedtime For 2 Weeks, Then Increase To One Pill By Mouth Two  Times A Day 8)  Hydrochlorothiazide 25 Mg Tabs (Hydrochlorothiazide) .... Take One Pill By Mouth Once Daily  Allergies (verified): No Known Drug Allergies  Past History:  Past medical, surgical, family and social histories (including risk factors) reviewed, and no changes noted (except as noted below).  Past Medical History: Reviewed history from 07/26/2008 and no changes required. erectile dysfunction polio-age 61 S/p foot surgery Mole on his right inner thigh, 1/3 of way between knee and thigh Alcohol dependence  Family History: Reviewed history from 09/19/2006 and no changes required. M-died of brain Ca, No known h/o colon Ca, CAD, DM but did not know his father and does not know anything about his grandparents  Social History: Reviewed history from 07/26/2008 and no changes required. drinks at least 40 ounces of beer/day; smokes 1/2ppd  Physical Exam  General:  alert, well-developed, and well-nourished.   Head:  normocephalic and atraumatic.   Eyes:  vision grossly intact, pupils equal, pupils round, and pupils reactive to light.  Sclerae anicteric, conjunctivae without injection. Mouth:  pharynx pink and moist.   Lungs:  normal respiratory effort and normal breath sounds.   Heart:  normal rate, regular rhythm, and no murmur.   Abdomen:  soft, non-tender, normal bowel sounds, no distention, and no masses.   Msk:  no joint tenderness, no joint swelling, no joint warmth, no redness over joints, no joint instability, and no crepitation.    Some deformity of L foot, likely post-surgical changes from foot surgery as child.     Extremities:  No clubbing, cyanosis, or edema. Neurologic:  alert & oriented X3.   Skin:  Intact without suspicious lesions or rashes Psych:  Cognition and judgment appear intact. Alert and cooperative with normal attention span and concentration. No apparent delusions, illusions, hallucinations   Impression & Recommendations:  Problem # 1:  SPASM  OF MUSCLE (ICD-728.85) Pts muscle cramps are likely residual effects of left foot surgery as a child performed for complications of polio.  Will try a course of daily flexeril for muscle spasm.  Advised pt to start with 1 pill before bed every night for 1-2 weeks and increase slowly to twice daily dosing.  Informed him it is important to take this medication regularly to achieve the best opportunity for relief; flexeril much reach steady state with two times a day-tid dosing.  Will f/u in one month and increase to three times a day if necessary.  Problem # 2:  HYPERTENSION (ICD-401.9) Will increase lisinopril to 40mg  from 20mg .  Discussed the importance of taking his medications every day and the need for regular and close follow up after any changes are made to  his medications.  Pt states he understands and ensures me he will make his f/u appt in 1 mo.  Will check CMET at that time to assess kidney and liver fxn.    Pt also agrees to maintain a BP log until his next appt so we might have an accurate record to review.  His updated medication list for this problem includes:    Lisinopril 40 Mg Tabs (Lisinopril) .Marland Kitchen... Take on pill by mouth once daily    Hydrochlorothiazide 25 Mg Tabs (Hydrochlorothiazide) .Marland Kitchen... Take one pill by mouth once daily  Orders: T-Basic Metabolic Panel [id number])  BP today: 150/97 Prior BP: 147/86 (06/30/2009)  Labs Reviewed: K+: 4.1 (10/13/2008) Creat: : 1.29 (10/13/2008)   Chol: 139 (10/23/2007)   HDL: 51 (10/23/2007)   LDL: 66 (10/23/2007)   TG: 111 (10/23/2007)  Problem # 3:  DEGENERATIVE JOINT DISEASE (ICD-715.90) Pt reports arthritic left hip pain that is partially alleviatde with  OTC NSAIDS.  Will try naprosyn and f/u in one month.  If necessary, can increase dosing to 500mg .  His updated medication list for this problem includes:        Vicodin 5-500 Mg Tabs (Hydrocodone-acetaminophen) .Marland Kitchen... Take 1 pill every 6 hours as needed for pain    Naproxen 375 Mg  Tabs (Naproxen) .Marland Kitchen... Take one pill by mouth two times a day  Problem # 4:  FOOT PAIN, BILATERAL (ICD-729.5) Pain well controlled with current dose of vicodin.  Pt has pain contract with [facility].  No changes to dosing at this time.  Problem # 5:  TOBACCO ABUSE (ICD-305.1) Encouraged smoking cessation and discussed different methods for smoking cessation.  Pt understands risks of continued tobacco use but desires to continue smoking.  Will continue to encourage tobacco cessation at each visit.  Complete Medication List: 1)  Vitamin B-1 100 Mg Tabs (Thiamine hcl) .... Take 1 tablet by mouth once a day 2)  Folic Acid 1 Mg Tabs (Folic acid) .... Take 1 tablet by mouth once a day 3)  Lisinopril 40 Mg Tabs (Lisinopril) .... Take on pill by mouth once daily 4)  Vicodin 5-500 Mg Tabs (Hydrocodone-acetaminophen) .... Take 1 pill every 6 hours as needed for pain 5)  Naproxen 375 Mg Tabs (Naproxen) .... Take one pill by mouth two times a day 6)  Flexeril 10 Mg Tabs (Cyclobenzaprine hcl) .... Take one pill by mouth at bedtime for 2 weeks, then increase to one pill by mouth two times a day 7)  Hydrochlorothiazide 25 Mg Tabs (Hydrochlorothiazide) .... Take one pill by mouth once daily  Patient Instructions: 1)  Please schedule a follow-up appointment in 1 month. 2)  Record you blood pressure measurements over the next month and bring them in to your next appointment. 3)  You blood pressure pill was changed from a combo pill of lisinopril-hctz to 2 pills; one is a higher dose of lisinopril, the other is the same dose of your hctz (fluid pill).   4)  STOP taking the combo pill and start taking the separate pills as directed. 5)  Flexeril will help with your muscle cramps and spasm.   6)  Naproxen is a pill for arthritis.  Be sure to take this pill with food. Prescriptions: FLEXERIL 10 MG TABS (CYCLOBENZAPRINE HCL) Take one pill by mouth at bedtime for 2 weeks, then increase to one pill by mouth two times a day   #60 x 3   Entered and Authorized by:   Nelda Bucks DO  Signed by:   Nelda Bucks DO on 09/30/2009   Method used:   Print then Give to Patient   RxID:   [id number] HYDROCHLOROTHIAZIDE 25 MG TABS (HYDROCHLOROTHIAZIDE) take one pill by mouth once daily  #30 x 0   Entered and Authorized by:   Nelda Bucks DO   Signed by:   Nelda Bucks DO on 09/30/2009   Method used:   Print then Give to Patient   RxID:   [id number] NAPROXEN 375 MG TABS (NAPROXEN) Take one pill by mouth two times a day  #60 x 3   Entered and Authorized by:   Nelda Bucks DO   Signed by:   Nelda Bucks DO on 09/30/2009   Method used:   Print then Give to Patient   RxID:   [id number] LISINOPRIL 40 MG TABS (LISINOPRIL) take on pill by mouth once daily  #30 x 0   Entered and Authorized by:   Nelda Bucks DO   Signed by:   Nelda Bucks DO on 09/30/2009   Method used:   Print then Give to Patient   RxID:   [id number] HYDROCHLOROTHIAZIDE 25 MG TABS (HYDROCHLOROTHIAZIDE) take one pill by mouth once daily  #30 x 0   Entered and Authorized by:   Nelda Bucks DO   Signed by:   Nelda Bucks DO on 09/30/2009   Method used:   Print then Give to Patient   RxID:   [id number] LISINOPRIL 40 MG TABS (LISINOPRIL) take on pill by mouth once daily  #30 x 0   Entered and Authorized by:   Nelda Bucks DO   Signed by:   Nelda Bucks DO on 09/30/2009   Method used:   Print then Give to Patient   RxID:   [id number] FLEXERIL 10 MG TABS (CYCLOBENZAPRINE HCL) Take one pill by mouth at bedtime for 2 weeks, then increase to one pill by mouth two times a day  #60 x 3   Entered and Authorized by:   Nelda Bucks DO   Signed by:   Nelda Bucks DO on 09/30/2009   Method used:   Print then Give to Patient   RxID:   [id number] NAPROXEN 375 MG TABS (NAPROXEN) Take one pill by mouth two times a day  #60 x 3   Entered and Authorized by:   Nelda Bucks DO   Signed by:   Nelda Bucks DO on  09/30/2009   Method used:   Print then Give to Patient   RxID:   [id number]   Prevention & Chronic Care Immunizations   Influenza vaccine: Fluvax 3+  (06/30/2009)    Tetanus booster: 06/22/2000: Done.    Pneumococcal vaccine: Not documented  Colorectal Screening   Hemoccult: Not documented    Colonoscopy: Location:  [facility].    (01/05/2008)   Colonoscopy due: 12/2017  Other Screening   PSA: Not documented   Smoking status: current  (09/30/2009)   Smoking cessation counseling: yes  (09/30/2009)  Lipids   Total Cholesterol: 139  (10/23/2007)   LDL: 66  (10/23/2007)   LDL Direct: Not documented   HDL: 51  (10/23/2007)   Triglycerides: 111  (10/23/2007)  Hypertension   Last Blood Pressure: 150 / 97  (09/30/2009)   Serum creatinine: 1.29  (10/13/2008)   Serum potassium 4.1  (10/13/2008)  Self-Management Support :   Personal Goals (by the next clinic visit) :      Personal blood pressure goal: 140/90  (06/30/2009)   Patient will work on  the following items until the next clinic visit to reach self-care goals:     Medications and monitoring: take my medicines every day  (09/30/2009)     Eating: eat more vegetables, eat foods that are low in salt, eat baked foods instead of fried foods  (09/30/2009)     Activity: take a 30 minute walk every day  (09/30/2009)    Hypertension self-management support: Pre-printed educational material, Resources for patients handout  (09/30/2009)      Resource handout printed.  Process Orders Check Orders Results:     [facility]: Check successful Tests Sent for requisitioning (October 02, 2009 7:56 PM):     09/30/2009: [facility] -- T-Basic Metabolic Panel [id number] (signed)

## 2010-08-24 NOTE — Progress Notes (Signed)
Summary: refill/ hla  Phone Note Refill Request Message from:  Patient on March 28, 2010 3:05 PM  Refills Requested: Medication #1:  VICODIN 5-500 MG TABS take 1 pill every 6 hours as needed for pain   Dosage confirmed as above?Dosage Confirmed   Brand Name Necessary? No   Supply Requested: 1 month   Last Refilled: 8/8 last visit 7/29  Initial call taken by: Marin Roberts RN,  March 28, 2010 3:05 PM  Follow-up for Phone Call        Refill approved-nurse to complete Follow-up by: Lars Mage MD,  March 29, 2010 5:21 PM    Prescriptions: VICODIN 5-500 MG TABS (HYDROCODONE-ACETAMINOPHEN) take 1 pill every 6 hours as needed for pain  #90 x 0   Entered and Authorized by:   Lars Mage MD   Signed by:   Lars Mage MD on 03/29/2010   Method used:   Telephoned to ...       [facility] [address] [phone]* (retail)       [address]       [place], [place]  [address]       Ph: [phone]       Fax: [phone]   RxID:   [id number]

## 2010-08-24 NOTE — Assessment & Plan Note (Signed)
Summary: Social Work  60  minutes. .  Soc. Work.  Smoking Cessation Counseling.   Met with Jonathan [name] who is not quite ready to come up with a quit date.  He is however motivated to prepare for quitting since his 53 y.o non smoking son is coming to live with him in about one month.   Jonathan [name] smokes about 1/2 pack per day and has been smoking for 32 years.  He has had little success trying to quit on his own.    Suggestions for quitting were given to Jonathan [name].  Suggested he begin cleaning out his apartment and smoking outside and prepare forr a smokefree environment.  He is also going to start pricing the patches to get an idea of how much those will cost him to purchase.  He will likely need to begin with the 21 mg since he reports that he is very addicted and experiences withdrawal once he stops smoking.   I've given Jonathan [name] information about the quitline and encouraged him to call there.  He is also willing to cut back on the number of cig. he smokes each day and I've encouraged him to do so.   Jonathan [name] is to call me when he comes up with a quit date and we will have another counseling session and come up with a formal plan for quitting.  Tip sheet was given to him to take with him and read.

## 2010-08-24 NOTE — Progress Notes (Signed)
Summary: refill/gg  Phone Note Refill Request  on February 24, 2010 10:59 AM  Refills Requested: Medication #1:  VICODIN 5-500 MG TABS take 1 pill every 6 hours as needed for pain   Dosage confirmed as above?Dosage Confirmed   Brand Name Necessary? No   Supply Requested: 1 month   Last Refilled: 01/27/2010 Due Monday   Method Requested: Telephone to Pharmacy Initial call taken by: Merrie Roof RN,  February 24, 2010 10:59 AM  Follow-up for Phone Call        Refill approved-nurse to complete Follow-up by: Lars Mage MD,  February 25, 2010 7:19 PM  Additional Follow-up for Phone Call Additional follow up Details #1::        Rx called to pharmacy Additional Follow-up by: Merrie Roof RN,  February 27, 2010 10:52 AM    Prescriptions: VICODIN 5-500 MG TABS (HYDROCODONE-ACETAMINOPHEN) take 1 pill every 6 hours as needed for pain  #90 x 0   Entered and Authorized by:   Lars Mage MD   Signed by:   Lars Mage MD on 02/27/2010   Method used:   Telephoned to ...       [facility] [address] [phone]* (retail)       [address]       [place], [place]  [address]       Ph: [phone]       Fax: [phone]   RxID:   [id number]

## 2010-08-24 NOTE — Assessment & Plan Note (Signed)
Summary: EST-CK/FU/MEDS/CFB   Vital Signs:  Patient profile:   55 year old male Height:      72 inches (182.88 cm) Weight:      170.9 pounds (79.23 kg) BMI:     23.72 Temp:     97.2 degrees F (36.22 degrees C) oral Pulse rate:   67 / minute BP sitting:   156 / 97  (left arm) Cuff size:   regular  Vitals Entered By: Theotis Barrio NT II (January 27, 2010 10:19 AM) CC: LEFT HIP PAIN   /   MEDICATION REFILL Is Patient Diabetic? No Pain Assessment Patient in pain? yes     Location: L HIP Intensity:      7 Type: THROBS Onset of pain  FOR ABOUT 2-3 MONTHS Nutritional Status BMI of 19 -24 = normal  Have you ever been in a relationship where you felt threatened, hurt or afraid?No   Does patient need assistance? Functional Status Self care Ambulation Normal Comments LEFT HIP PAIN   / MEDICATION REFILL   Primary Care Provider:  Lars Mage MD  CC:  LEFT HIP PAIN   /   MEDICATION REFILL.  History of Present Illness: Mr Bottger is 55 year old man with PMH asdescribed in EMR is here today for regular follow up, medication refill.  His pain in left leg 2/2 polio is well controlled with vicodin but there are certain dasy when he is active and he will have to take more then 3 pills a day for adequate pain control. He has not refilled the Flexeril prescription given to him at his last appointment and said that he was never told about it. I will give him refill of Flexeril today and refill his vicodin.  His BP is 159/94 but repeat BP is 142/88. I will not make nay change tohis meds. He does request me to change 40MG  lisinopril to 2 X 20 mg tabs so that he can get it cheaper.  His immunizations and preventive helath care is uptodate.  Preventive Screening-Counseling & Management  Alcohol-Tobacco     Alcohol drinks/day: 1     Alcohol type: BEER      Smoking Status: current     Smoking Cessation Counseling: yes     Packs/Day: 1/2     Passive Smoke Exposure:  no  Caffeine-Diet-Exercise     Does Patient Exercise: yes     Type of exercise: WALKING     Times/week: 7  Problems Prior to Update: 1)  Degenerative Joint Disease  (ICD-715.90) 2)  Spasm of Muscle  (ICD-728.85) 3)  Preventive Health Care  (ICD-V70.0) 4)  Foot Pain, Bilateral  (ICD-729.5) 5)  Calluses, Feet, Bilateral  (ICD-700) 6)  Transaminases, Serum, Elevated  (ICD-790.4) 7)  Tobacco Abuse  (ICD-305.1) 8)  Alcohol Abuse  (ICD-305.00) 9)  Weakness  (ICD-780.79) 10)  Callus  (ICD-700) 11)  Ankle Pain, Right  (ICD-719.47) 12)  Foot Pain, Left  (ICD-729.5) 13)  Post-polio Syndrome  (ICD-138) 14)  Erectile Dysfunction  (ICD-302.72) 15)  Hypertension  (ICD-401.9) 16)  Hepatitis C  (ICD-070.51)  Medications Prior to Update: 1)  Vitamin B-1 100 Mg Tabs (Thiamine Hcl) .... Take 1 Tablet By Mouth Once A Day 2)  Folic Acid 1 Mg Tabs (Folic Acid) .... Take 1 Tablet By Mouth Once A Day 3)  Lisinopril 40 Mg Tabs (Lisinopril) .... Take On Pill By Mouth Once Daily 4)  Vicodin 5-500 Mg Tabs (Hydrocodone-Acetaminophen) .... Take 1 Pill Every 6 Hours As  Needed For Pain 5)  Naproxen 375 Mg Tabs (Naproxen) .... Take One Pill By Mouth Two Times A Day 6)  Flexeril 10 Mg Tabs (Cyclobenzaprine Hcl) .... Take One Pill By Mouth At Bedtime For 2 Weeks, Then Increase To One Pill By Mouth Two Times A Day 7)  Hydrochlorothiazide 25 Mg Tabs (Hydrochlorothiazide) .... Take One Pill By Mouth Once Daily  Current Medications (verified): 1)  Vitamin B-1 100 Mg Tabs (Thiamine Hcl) .... Take 1 Tablet By Mouth Once A Day 2)  Folic Acid 1 Mg Tabs (Folic Acid) .... Take 1 Tablet By Mouth Once A Day 3)  Lisinopril 20 Mg Tabs (Lisinopril) .... Take 1 Tablet By Mouth Two Times A Day 4)  Vicodin 5-500 Mg Tabs (Hydrocodone-Acetaminophen) .... Take 1 Pill Every 6 Hours As Needed For Pain 5)  Flexeril 10 Mg Tabs (Cyclobenzaprine Hcl) .... Take One Pill By Mouth At Bedtime For 2 Weeks, Then Increase To One Pill By Mouth  Two Times A Day 6)  Hydrochlorothiazide 25 Mg Tabs (Hydrochlorothiazide) .... Take One Pill By Mouth Once Daily  Allergies (verified): No Known Drug Allergies  Past History:  Past Medical History: Last updated: 07/26/2008 erectile dysfunction polio-age 51 S/p foot surgery Mole on his right inner thigh, 1/3 of way between knee and thigh Alcohol dependence  Family History: Last updated: 09/24/2006 M-died of brain Ca, No known h/o colon Ca, CAD, DM but did not know his father and does not know anything about his grandparents  Social History: Last updated: 07/26/2008 drinks at least 40 ounces of beer/day; smokes 1/2ppd  Risk Factors: Alcohol Use: 1 (01/27/2010) Exercise: yes (01/27/2010)  Risk Factors: Smoking Status: current (01/27/2010) Packs/Day: 1/2 (01/27/2010) Passive Smoke Exposure: no (01/27/2010)  Family History: Reviewed history from 2006/09/24 and no changes required. M-died of brain Ca, No known h/o colon Ca, CAD, DM but did not know his father and does not know anything about his grandparents  Review of Systems      See HPI  Physical Exam  Additional Exam:  Gen: AOx3, in no acute distress Eyes: PERRL, EOMI ENT:MMM, No erythema noted in posterior pharynx Neck: No JVD, No LAP Chest: CTAB with  good respiratory effort CVS: regular rhythmic rate, NO M/R/G, S1 S2 normal Abdo: soft,ND, BS+x4, Non tender and No hepatosplenomegaly EXT: No odema noted Neuro: Non focal, gait is abnormal 2/2 polio Skin: no rashes noted.    Impression & Recommendations:  Problem # 1:  FOOT PAIN, BILATERAL (ICD-729.5) Assessment Deteriorated I will add flxeril to the patients med list. he is told to take the flexeril just at night as it is highly sedating. He may go up to 1 tab twice daily as needed for pain. Refilled his vicodin today. I do not suspect any abuse at this time and di not feel a need to check UDS. he is in pain contract with [place].  Problem # 2:  HYPERTENSION  (ICD-401.9) Assessment: Improved Continue current meds as repeat BP was normal. His updated medication list for this problem includes:    Lisinopril 20 Mg Tabs (Lisinopril) .Marland Kitchen... Take 1 tablet by mouth two times a day    Hydrochlorothiazide 25 Mg Tabs (Hydrochlorothiazide) .Marland Kitchen... Take one pill by mouth once daily  BP today: 156/97 Prior BP: 150/97 (09/30/2009)  Labs Reviewed: K+: 4.0 (09/30/2009) Creat: : 1.23 (09/30/2009)   Chol: 139 (10/23/2007)   HDL: 51 (10/23/2007)   LDL: 66 (10/23/2007)   TG: 111 (10/23/2007)  Problem # 3:  PREVENTIVE HEALTH CARE (  ICD-V70.0) Assessment: Comment Only  Reviewed preventive care protocols, scheduled due services, and updated immunizations.   Problem # 4:  TOBACCO ABUSE (ICD-305.1) Assessment: Comment Only  Not interested in quitting at this time.  Encouraged smoking cessation and discussed different methods for smoking cessation.   Problem # 5:  HEPATITIS C (ICD-070.51) Assessment: Comment Only Chronic and stable, no dark urine, change in colour of stoll or abdominal pain.  Complete Medication List: 1)  Vitamin B-1 100 Mg Tabs (Thiamine hcl) .... Take 1 tablet by mouth once a day 2)  Folic Acid 1 Mg Tabs (Folic acid) .... Take 1 tablet by mouth once a day 3)  Lisinopril 20 Mg Tabs (Lisinopril) .... Take 1 tablet by mouth two times a day 4)  Vicodin 5-500 Mg Tabs (Hydrocodone-acetaminophen) .... Take 1 pill every 6 hours as needed for pain 5)  Flexeril 10 Mg Tabs (Cyclobenzaprine hcl) .... Take one pill by mouth at bedtime for 2 weeks, then increase to one pill by mouth two times a day 6)  Hydrochlorothiazide 25 Mg Tabs (Hydrochlorothiazide) .... Take one pill by mouth once daily  Patient Instructions: 1)  Please schedule a follow-up appointment in 2-3 weeks. 2)  Maintain your BP log and bring it to the clinic on your next appointment. 3)  Tobacco is very bad for your health and your loved ones! You Should stop smoking!. 4)  Stop Smoking  Tips: Choose a Quit date. Cut down before the Quit date. decide what you will do as a substitute when you feel the urge to smoke(gum,toothpick,exercise). 5)  It is important that you exercise regularly at least 20 minutes 5 times a week. If you develop chest pain, have severe difficulty breathing, or feel very tired , stop exercising immediately and seek medical attention. 6)  Check your Blood Pressure regularly. If it is above: you should make an appointment. 7)  Limit your Sodium (Salt). Prescriptions: VICODIN 5-500 MG TABS (HYDROCODONE-ACETAMINOPHEN) take 1 pill every 6 hours as needed for pain  #90 x 0   Entered and Authorized by:   Lars Mage MD   Signed by:   Lars Mage MD on 01/27/2010   Method used:   Print then Give to Patient   RxID:   [id number] FLEXERIL 10 MG TABS (CYCLOBENZAPRINE HCL) Take one pill by mouth at bedtime for 2 weeks, then increase to one pill by mouth two times a day  #60 x 3   Entered and Authorized by:   Lars Mage MD   Signed by:   Lars Mage MD on 01/27/2010   Method used:   Print then Give to Patient   RxID:   [id number] LISINOPRIL 20 MG TABS (LISINOPRIL) Take 1 tablet by mouth two times a day  #62 x 11   Entered and Authorized by:   Lars Mage MD   Signed by:   Lars Mage MD on 01/27/2010   Method used:   Print then Give to Patient   RxID:   [id number]    Prevention & Chronic Care Immunizations   Influenza vaccine: Fluvax 3+  (06/30/2009)   Influenza vaccine deferral: Deferred  (01/27/2010)    Tetanus booster: 06/22/2000: Done.    Pneumococcal vaccine: Not documented  Colorectal Screening   Hemoccult: Not documented   Hemoccult action/deferral: Not indicated  (01/27/2010)    Colonoscopy: Location:  [facility].    (01/05/2008)   Colonoscopy due: 12/2017  Other Screening   PSA: Not documented   PSA action/deferral: Discussion  deferred  (01/27/2010)   Smoking status: current  (01/27/2010)   Smoking cessation  counseling: yes  (01/27/2010)  Lipids   Total Cholesterol: 139  (10/23/2007)   LDL: 66  (10/23/2007)   LDL Direct: Not documented   HDL: 51  (10/23/2007)   Triglycerides: 111  (10/23/2007)  Hypertension   Last Blood Pressure: 156 / 97  (01/27/2010)   Serum creatinine: 1.23  (09/30/2009)   Serum potassium 4.0  (09/30/2009)    Hypertension flowsheet reviewed?: Yes   Progress toward BP goal: At goal  Self-Management Support :   Personal Goals (by the next clinic visit) :      Personal blood pressure goal: 140/90  (06/30/2009)   Patient will work on the following items until the next clinic visit to reach self-care goals:     Medications and monitoring: take my medicines every day, bring all of my medications to every visit  (01/27/2010)     Eating: drink diet soda or water instead of juice or soda, use fresh or frozen vegetables, eat baked foods instead of fried foods, eat fruit for snacks and desserts, limit or avoid alcohol  (01/27/2010)     Activity: take a 30 minute walk every day  (01/27/2010)    Hypertension self-management support: BP self-monitoring log, Resources for patients handout, Written self-care plan  (01/27/2010)   Hypertension self-care plan printed.      Resource handout printed.

## 2010-08-24 NOTE — Progress Notes (Signed)
Summary: Soc. 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

## 2010-10-06 LAB — URINALYSIS, ROUTINE W REFLEX MICROSCOPIC
Nitrite: NEGATIVE
Protein, ur: NEGATIVE mg/dL
pH: 6 (ref 5.0–8.0)

## 2010-10-06 LAB — POCT URINALYSIS DIPSTICK
Bilirubin Urine: NEGATIVE
Glucose, UA: NEGATIVE mg/dL
Hgb urine dipstick: NEGATIVE
Nitrite: NEGATIVE

## 2010-10-06 LAB — GC/CHLAMYDIA PROBE AMP, GENITAL: Chlamydia, DNA Probe: NEGATIVE

## 2010-10-23 ENCOUNTER — Other Ambulatory Visit (INDEPENDENT_AMBULATORY_CARE_PROVIDER_SITE_OTHER): Payer: Medicare Other | Admitting: *Deleted

## 2010-10-23 DIAGNOSIS — I10 Essential (primary) hypertension: Secondary | ICD-10-CM

## 2010-10-24 MED ORDER — CYCLOBENZAPRINE HCL 10 MG PO TABS: 10.0000 mg | ORAL_TABLET | Freq: Two times a day (BID) | ORAL | Status: AC | PRN

## 2010-11-02 ENCOUNTER — Ambulatory Visit (INDEPENDENT_AMBULATORY_CARE_PROVIDER_SITE_OTHER): Payer: Self-pay | Admitting: Internal Medicine

## 2010-11-02 ENCOUNTER — Encounter: Payer: Self-pay | Admitting: Internal Medicine

## 2010-11-02 DIAGNOSIS — M542 Cervicalgia: Secondary | ICD-10-CM

## 2010-11-02 DIAGNOSIS — M25579 Pain in unspecified ankle and joints of unspecified foot: Secondary | ICD-10-CM

## 2010-11-02 DIAGNOSIS — F17200 Nicotine dependence, unspecified, uncomplicated: Secondary | ICD-10-CM

## 2010-11-02 DIAGNOSIS — I10 Essential (primary) hypertension: Secondary | ICD-10-CM

## 2010-11-02 DIAGNOSIS — M62838 Other muscle spasm: Secondary | ICD-10-CM

## 2010-11-02 DIAGNOSIS — Z23 Encounter for immunization: Secondary | ICD-10-CM

## 2010-11-02 DIAGNOSIS — B1710 Acute hepatitis C without hepatic coma: Secondary | ICD-10-CM

## 2010-11-02 LAB — LIPID PANEL
Cholesterol: 159 mg/dL (ref 0–200)
HDL: 51 mg/dL (ref >39)
Triglycerides: 103 mg/dL (ref <150)

## 2010-11-02 LAB — BASIC METABOLIC PANEL
Calcium: 9.9 mg/dL (ref 8.4–10.5)
Chloride: 106 mEq/L (ref 96–112)
Creat: 1.17 mg/dL (ref 0.40–1.50)

## 2010-11-02 MED ORDER — LISINOPRIL-HYDROCHLOROTHIAZIDE 20-12.5 MG PO TABS: 1.0000 | ORAL_TABLET | Freq: Every day | ORAL | Status: DC

## 2010-11-02 MED ORDER — HYDROCODONE-ACETAMINOPHEN 5-500 MG PO TABS: 1.0000 | ORAL_TABLET | Freq: Four times a day (QID) | ORAL | Status: DC | PRN

## 2010-11-02 NOTE — Assessment & Plan Note (Signed)
Patient states that Vicodin 90 tablets does not help and he usually ends up taking about 4-5 ibuprofen 200 mg tablets a day. I would prescribe him 120 tablets of Vicodin from next month onwards and sign a new contract at our next office visit. I have asked him to cut down on his Flexeril at night and instead use Vicodin half tablet to one tablet. I will not check UDS today but would have it checked when he comes in for office visit in 2 weeks.

## 2010-11-02 NOTE — Patient Instructions (Signed)
Cervical Radiculopathy Cervical radiculopathy is a pinched nerve in the neck. When this happens you may have pain or numbness shooting from your neck all the way down into your arm and fingers. There are many causes of this problem. Sometimes this may happen from an injury or simply from muscle tightness in the neck from overuse. It may also happen from arthritis or boney problems. If there is no improvement after treatment, further studies may be done to find the exact cause. DIAGNOSIS X-rays may be needed if the problems become long standing. Electromyograms may be done. This study is one in which the working of nerves and muscles is studied. HOME CARE INSTRUCTIONS  Applications of ice packs may be helpful. Ice can be used in a plastic bag with a towel around it to prevent frostbite to skin. This may be used every 2 hours for 20 to 30 minutes, or as needed, while awake or as directed by your caregiver.   Sleep at night with a flat pillow.   Only take over-the-counter or prescription medicines for pain, discomfort, or fever as directed by your caregiver.   If physical therapy was prescribed, follow your caregiver's directions. If a cervical collar or cervical traction device was prescribed, use them as directed.  SEEK IMMEDIATE MEDICAL CARE IF:  You have pain not controlled with medications.   You seem to be getting worse rather than better.   You develop weakness in your hand or arm.   You develop new numbness or loss of feeling in your arms or legs.   You develop loss of bowel or bladder control.   You have difficulty with walking or balance, or develop clumsiness in the use of your arms or legs.  MAKE SURE YOU:   Understand these instructions.   Will watch your condition.   Will get help right away if you are not doing well or get worse.  Document Released: 04/03/2001 Document Re-Released: 10/25/2008 [facility] Patient Information 2011 [place], [place].   Follow up in 2 weeks  for BP check up. Buy a BP machine at home. Chart your BP every day and bring your readings noted in a diary to your next appointment.

## 2010-11-02 NOTE — Assessment & Plan Note (Signed)
Patient is going to see his hepatitis clinic in next few days as it has been a year that he saw them last.

## 2010-11-02 NOTE — Progress Notes (Signed)
  Subjective:    Patient ID: Jonathan [name], male    DOB: 1956/02/24, 55 y.o.   MRN: [id number]  HPI Jonathan [name] is a 55 year old man with past medical history of alcohol abuse, hepatitis C and post polio syndrome.  Patient is here today for the regular followup as well as a new complaint of neck pain.  Next pain is described as sudden spasm which runs from back of her neck to his face and lasts about a few seconds, no fever, chills, unexplained weight loss. Pain has been present for last 3 weeks. Pain comes and goes and lasts only a few seconds. There is no tingling, numbness or weakness in his arms.  Patient states that the pain in his left ankle is not well controlled by the current doses of Vicodin. Patient states that he usually ends up using about 4 tablets of reported milligram ibuprofen a day. Patient also takes cyclobenzaprine about one to 2 tablets every night to sleep.  Patient's blood pressure is not well controlled today. Patient is currently taking hydrochlorothiazide and lisinopril. Patient states that he takes both his medications in the morning and he took that medication today as well.   The patient is still smoking about a half pack per day. Does not want to quit at this time.  Patient wants to see Jonathan [name] to get himself in [place] card.  Patient is up-to-date on his colonoscopy will need to TDap Shot today.   Review of Systems  Constitutional: Negative for fever, activity change and appetite change.  HENT: Negative for sore throat.   Respiratory: Negative for cough and shortness of breath.   Cardiovascular: Negative for chest pain and leg swelling.  Gastrointestinal: Negative for nausea, abdominal pain, diarrhea, constipation and abdominal distention.  Genitourinary: Negative for frequency, hematuria and difficulty urinating.  Neurological: Negative for dizziness and headaches.  Psychiatric/Behavioral: Negative for suicidal ideas and behavioral problems.       Objective:   Physical Exam  Constitutional: He is oriented to person, place, and time. He appears well-developed and well-nourished.  HENT:  Head: Normocephalic and atraumatic.  Eyes: Conjunctivae and EOM are normal. Pupils are equal, round, and reactive to light. No scleral icterus.  Neck: Normal range of motion. Neck supple. No JVD present. No thyromegaly present.  Cardiovascular: Normal rate, regular rhythm, normal heart sounds and intact distal pulses.  Exam reveals no gallop and no friction rub.   No murmur heard. Pulmonary/Chest: Effort normal and breath sounds normal. No respiratory distress. He has no wheezes. He has no rales.  Abdominal: Soft. Bowel sounds are normal. He exhibits no distension and no mass. There is no tenderness. There is no rebound and no guarding.  Musculoskeletal: Normal range of motion. He exhibits no edema and no tenderness.       Patient has braces on his left foot.  Lymphadenopathy:    He has no cervical adenopathy.  Neurological: He is alert and oriented to person, place, and time.  Psychiatric: He has a normal mood and affect. His behavior is normal.          Assessment & Plan:

## 2010-11-02 NOTE — Assessment & Plan Note (Signed)
Does not want to quit at this time.

## 2010-11-02 NOTE — Assessment & Plan Note (Signed)
Past him to buy a blood pressure machine. Patient says that he cannot afford it today but would get it after first of next month. Patient said that there is a rightaid right in front of his house and he can go to the drug store and check his blood pressure every 2 days until he comes to see me back in 2 weeks. I will combine his blood pressure medication of HCTZ and lisinopril and also get potassium and creatinine today.

## 2010-11-02 NOTE — Assessment & Plan Note (Signed)
Patient has been taking Flexeril 10 mg 1-2 tablets at night for sleeping. I told him that this medication should not be used on a long-term basis. I will start him on physical therapy and see if that helps with the spasms.

## 2010-11-03 ENCOUNTER — Encounter: Payer: Self-pay | Admitting: Internal Medicine

## 2010-11-16 ENCOUNTER — Ambulatory Visit (INDEPENDENT_AMBULATORY_CARE_PROVIDER_SITE_OTHER): Payer: Medicare Other | Admitting: Internal Medicine

## 2010-11-16 ENCOUNTER — Encounter: Payer: Self-pay | Admitting: Internal Medicine

## 2010-11-16 DIAGNOSIS — I10 Essential (primary) hypertension: Secondary | ICD-10-CM

## 2010-11-16 DIAGNOSIS — F17200 Nicotine dependence, unspecified, uncomplicated: Secondary | ICD-10-CM

## 2010-11-16 MED ORDER — LISINOPRIL-HYDROCHLOROTHIAZIDE 20-12.5 MG PO TABS: 2.0000 | ORAL_TABLET | Freq: Every day | ORAL | Status: DC

## 2010-11-16 MED ORDER — AMLODIPINE BESYLATE 10 MG PO TABS: 10.0000 mg | ORAL_TABLET | Freq: Every day | ORAL | Status: DC

## 2010-11-16 NOTE — Patient Instructions (Signed)

## 2010-11-17 NOTE — Assessment & Plan Note (Signed)
Patient currently not read to quit.

## 2010-11-17 NOTE — Progress Notes (Signed)
  Subjective:    Patient ID: Jonathan [name], male    DOB: [birth year], 55 y.o.   MRN: [id number]  HPI Patient is a 55 year old man who is here today for blood pressure checkup. This is a followup visit from 2 weeks ago. Patient's blood pressure still high today at 152/105. Patient is still smoking about half pack a day.   Review of Systems  Constitutional: Negative for fever, activity change and appetite change.  HENT: Negative for sore throat.   Respiratory: Negative for cough and shortness of breath.   Cardiovascular: Negative for chest pain and leg swelling.  Gastrointestinal: Negative for nausea, abdominal pain, diarrhea, constipation and abdominal distention.  Genitourinary: Negative for frequency, hematuria and difficulty urinating.  Neurological: Negative for dizziness and headaches.  Psychiatric/Behavioral: Negative for suicidal ideas and behavioral problems.       Objective:   Physical Exam  Constitutional: He is oriented to person, place, and time. He appears well-developed and well-nourished.  HENT:  Head: Normocephalic and atraumatic.  Eyes: Conjunctivae and EOM are normal. Pupils are equal, round, and reactive to light. No scleral icterus.  Neck: Normal range of motion. Neck supple. No JVD present. No thyromegaly present.  Cardiovascular: Normal rate, regular rhythm, normal heart sounds and intact distal pulses.  Exam reveals no gallop and no friction rub.   No murmur heard. Pulmonary/Chest: Effort normal and breath sounds normal. No respiratory distress. He has no wheezes. He has no rales.  Abdominal: Soft. Bowel sounds are normal. He exhibits no distension and no mass. There is no tenderness. There is no rebound and no guarding.  Musculoskeletal: He exhibits no edema and no tenderness.       brace post polio in left leg  Lymphadenopathy:    He has no cervical adenopathy.  Neurological: He is alert and oriented to person, place, and time.  Psychiatric: He has a  normal mood and affect. His behavior is normal.          Assessment & Plan:

## 2010-11-17 NOTE — Assessment & Plan Note (Signed)
I will add Norvasc 10 mg to his blood pressure regimen today. I would call him back in one month for blood pressure checkup and go up on combination hctz/lisinopril medicine twice a day. Patient is currently taking ibuprofen about 3-4 times a day which can definitely add to the fact that he is hypertensive. I have asked him to avoid ibuprofen as much as he can. He can reassess his need for Vicodin at the next office visit if he continues to take ibuprofen.

## 2010-11-20 ENCOUNTER — Ambulatory Visit: Payer: Medicare Other | Attending: Internal Medicine | Admitting: Physical Therapy

## 2010-11-20 DIAGNOSIS — M2569 Stiffness of other specified joint, not elsewhere classified: Secondary | ICD-10-CM | POA: Insufficient documentation

## 2010-11-20 DIAGNOSIS — M542 Cervicalgia: Secondary | ICD-10-CM | POA: Insufficient documentation

## 2010-11-20 DIAGNOSIS — IMO0001 Reserved for inherently not codable concepts without codable children: Secondary | ICD-10-CM | POA: Insufficient documentation

## 2010-11-22 ENCOUNTER — Encounter: Payer: Medicare Other | Admitting: Physical Therapy

## 2010-11-27 ENCOUNTER — Ambulatory Visit: Payer: Medicare Other | Attending: Internal Medicine | Admitting: Physical Therapy

## 2010-11-27 DIAGNOSIS — M542 Cervicalgia: Secondary | ICD-10-CM | POA: Insufficient documentation

## 2010-11-27 DIAGNOSIS — IMO0001 Reserved for inherently not codable concepts without codable children: Secondary | ICD-10-CM | POA: Insufficient documentation

## 2010-11-27 DIAGNOSIS — M2569 Stiffness of other specified joint, not elsewhere classified: Secondary | ICD-10-CM | POA: Insufficient documentation

## 2010-12-01 ENCOUNTER — Ambulatory Visit: Payer: Medicare Other | Admitting: Physical Therapy

## 2010-12-04 ENCOUNTER — Encounter: Payer: Medicare Other | Admitting: Physical Therapy

## 2010-12-05 NOTE — Procedures (Signed)
EEG NUMBER:  [phone]   HISTORY:  The patient is a 55 year old with history of seizures and  possible stroke.  He had a syncopal episode and no recollection for  activity surrounding that.  The patient's family states that his eyes  were fixed, his jaw is tight, his mouth was drawn to the left.  Study is  being done to look for the presence of seizure (780.39).   PROCEDURE:  Tracing is carried out on a 32-channel digital Cadwell  recorder reformatted into 16 channel montages with one devoted to EKG.  The patient was awake and asleep during the recording.  The  [employer] 10/20 system lead placement was used.  He takes  antihypertensives and pain medicines.   DESCRIPTION OF FINDINGS:  Dominant frequency is a 20-30 microvolt 9 Hz  activity.  Superimposed upon this is under 10 microvolt beta range  activity.  The patient comes drowsy with mixed frequency semirhythmic  theta and upper delta range activity and sleep spindles.  During this  time, he is behaviorally asleep.   He is aroused.  Photic stimulation induced a driving response between 11  and 17 Hz.  Hyperventilation caused no change.  There was no interictal  epileptiform activity in the form of spikes or sharp waves.   EKG showed a sinus bradycardia with ventricular response of 60 beats per  minute.   IMPRESSION:  Normal record with the patient awake and asleep.      Deanna Artis. Sharene Skeans, M.D.  Electronically Signed     EAV:[id number]  D:  07/22/2008 18:00:31  T:  07/23/2008 04:38:44  Job #:  [id number]   cc:   Mick Sell, MD

## 2010-12-06 ENCOUNTER — Encounter: Payer: Medicare Other | Admitting: Physical Therapy

## 2010-12-15 ENCOUNTER — Ambulatory Visit: Payer: Medicare Other | Admitting: Physical Therapy

## 2010-12-15 ENCOUNTER — Ambulatory Visit (INDEPENDENT_AMBULATORY_CARE_PROVIDER_SITE_OTHER): Payer: Medicare Other | Admitting: Internal Medicine

## 2010-12-15 ENCOUNTER — Encounter: Payer: Self-pay | Admitting: Internal Medicine

## 2010-12-15 DIAGNOSIS — I10 Essential (primary) hypertension: Secondary | ICD-10-CM

## 2010-12-15 DIAGNOSIS — B91 Sequelae of poliomyelitis: Secondary | ICD-10-CM

## 2010-12-15 DIAGNOSIS — F17200 Nicotine dependence, unspecified, uncomplicated: Secondary | ICD-10-CM

## 2010-12-15 NOTE — Progress Notes (Signed)
  Subjective:    Patient ID: Jonathan [name], male    DOB: 04/08/1956, 55 y.o.   MRN: [id number]  HPI Mr. Delillo is a pleasant 55 year old man with possible history of hypertension, post polio syndrome, go to the clinic for a followup blood pressure check. He was seen in last month by Dr. Eben Burow for his hypertension and Norvasc was added during that visit considering his moderately elevated blood pressure for last 2 visits. Today's blood pressure is 110/74 and is asymptomatic. Feels good and denies any chest pain, shortness of breath, headache, dizziness, leg edema. He wears a brace on left leg due to the surgery for his post polio syndrome and has continuous pain in that leg. he is getting Vicodin for his pain and is feeling better now. Denies any fever, chills, diarrhea, abdominal pain, nausea, vomiting, recent weight change.   Review of Systems    as per history of present illness Objective:   Physical Exam    Constitutional: Vital signs reviewed.  Patient is a well-developed and well-nourished in no acute distress and cooperative with exam. Alert and oriented x3.  Head: Normocephalic and atraumatic Mouth: no erythema or exudates, MMM Eyes: PERRL, EOMI, conjunctivae normal, No scleral icterus.  Neck: Supple, Trachea midline normal ROM, No JVD, mass, thyromegaly, or carotid bruit present.  Cardiovascular: RRR, S1 normal, S2 normal, no MRG Pulmonary/Chest: CTAB, no wheezes, rales, or rhonchi Abdominal: Soft. Non-tender, non-distended, bowel sounds are normal, no masses, organomegaly, or guarding present.  GU: no CVA tenderness Musculoskeletal: No joint deformities, erythema, or stiffness, ROM full and no nontender Neurological: A&O x3, left leg brace. Uses cane to walk due to postpolio syndrome and surgery. Normal strength on right upper and lower extremity and left upper extremity. Skin: Warm, dry and intact. No rash, cyanosis, or clubbing.       Assessment & Plan:

## 2010-12-15 NOTE — Assessment & Plan Note (Signed)
Still continues to smoke, and is not ready to quit now.

## 2010-12-15 NOTE — Patient Instructions (Signed)
Please make a f/u appointment with Dr. Eben Burow in 4-5 months. Please take all your medications regularly.

## 2010-12-15 NOTE — Assessment & Plan Note (Signed)
Blood pressure 110/74 today better than all the past readings. Norvasc was added during last visit and it seems to be working. I Will not check and lab tests today. Encouraged him to take medications regularly and follow low salt diet.

## 2010-12-15 NOTE — Assessment & Plan Note (Signed)
He is already started on Vicodin and his pain is well controlled on it. We'll not make changes today.

## 2011-03-19 ENCOUNTER — Other Ambulatory Visit: Payer: Self-pay | Admitting: Internal Medicine

## 2011-03-19 DIAGNOSIS — M549 Dorsalgia, unspecified: Secondary | ICD-10-CM

## 2011-03-19 NOTE — Telephone Encounter (Signed)
Please call in the prescription.  Thank you

## 2011-03-19 NOTE — Telephone Encounter (Signed)
Called to pharm, please sign the request so we can close the note

## 2011-03-24 ENCOUNTER — Emergency Department (HOSPITAL_COMMUNITY)
Admission: EM | Admit: 2011-03-24 | Discharge: 2011-03-24 | Discharge status: Against Medical Advice | Payer: Medicare Other | Attending: Emergency Medicine | Admitting: Emergency Medicine

## 2011-03-24 ENCOUNTER — Emergency Department (HOSPITAL_COMMUNITY): Payer: Medicare Other

## 2011-03-24 DIAGNOSIS — Z8619 Personal history of other infectious and parasitic diseases: Secondary | ICD-10-CM | POA: Insufficient documentation

## 2011-03-24 DIAGNOSIS — R55 Syncope and collapse: Secondary | ICD-10-CM | POA: Insufficient documentation

## 2011-03-24 DIAGNOSIS — I959 Hypotension, unspecified: Secondary | ICD-10-CM | POA: Insufficient documentation

## 2011-03-24 DIAGNOSIS — I10 Essential (primary) hypertension: Secondary | ICD-10-CM | POA: Insufficient documentation

## 2011-03-24 DIAGNOSIS — R079 Chest pain, unspecified: Secondary | ICD-10-CM | POA: Insufficient documentation

## 2011-03-24 DIAGNOSIS — Z8612 Personal history of poliomyelitis: Secondary | ICD-10-CM | POA: Insufficient documentation

## 2011-03-24 LAB — COMPREHENSIVE METABOLIC PANEL
ALT: 62 U/L — ABNORMAL HIGH (ref 0–53)
CO2: 25 mEq/L (ref 19–32)
Calcium: 9.1 mg/dL (ref 8.4–10.5)
Chloride: 105 mEq/L (ref 96–112)
Creatinine, Ser: 1.24 mg/dL (ref 0.50–1.35)
GFR calc Af Amer: >60 mL/min (ref >60)
GFR calc non Af Amer: >60 mL/min (ref >60)
Glucose, Bld: 100 mg/dL — ABNORMAL HIGH (ref 70–99)
Sodium: 141 mEq/L (ref 135–145)
Total Bilirubin: 0.4 mg/dL (ref 0.3–1.2)

## 2011-03-24 LAB — URINE MICROSCOPIC-ADD ON

## 2011-03-24 LAB — URINALYSIS, ROUTINE W REFLEX MICROSCOPIC
Bilirubin Urine: NEGATIVE
Glucose, UA: NEGATIVE mg/dL
Hgb urine dipstick: NEGATIVE
Ketones, ur: NEGATIVE mg/dL
Protein, ur: NEGATIVE mg/dL

## 2011-03-24 LAB — DIFFERENTIAL
Lymphocytes Relative: 45 % (ref 12–46)
Lymphs Abs: 2.3 10*3/uL (ref 0.7–4.0)
Monocytes Relative: 10 % (ref 3–12)
Neutro Abs: 2.2 10*3/uL (ref 1.7–7.7)
Neutrophils Relative %: 44 % (ref 43–77)

## 2011-03-24 LAB — CBC
Hemoglobin: 12.5 g/dL — ABNORMAL LOW (ref 13.0–17.0)
MCH: 30.6 pg (ref 26.0–34.0)
MCV: 90.7 fL (ref 78.0–100.0)
RBC: 4.08 MIL/uL — ABNORMAL LOW (ref 4.22–5.81)

## 2011-04-23 ENCOUNTER — Encounter: Payer: Medicare Other | Admitting: Internal Medicine

## 2011-05-25 ENCOUNTER — Encounter: Payer: Self-pay | Admitting: Internal Medicine

## 2011-05-25 ENCOUNTER — Ambulatory Visit (INDEPENDENT_AMBULATORY_CARE_PROVIDER_SITE_OTHER): Payer: Medicare Other | Admitting: Internal Medicine

## 2011-05-25 VITALS — BP 107/69 | HR 90 | Temp 98.0°F | Wt 176.0 lb

## 2011-05-25 DIAGNOSIS — R55 Syncope and collapse: Secondary | ICD-10-CM

## 2011-05-25 DIAGNOSIS — G8929 Other chronic pain: Secondary | ICD-10-CM

## 2011-05-25 DIAGNOSIS — B1710 Acute hepatitis C without hepatic coma: Secondary | ICD-10-CM

## 2011-05-25 DIAGNOSIS — Z23 Encounter for immunization: Secondary | ICD-10-CM

## 2011-05-25 DIAGNOSIS — B91 Sequelae of poliomyelitis: Secondary | ICD-10-CM

## 2011-05-25 DIAGNOSIS — Z299 Encounter for prophylactic measures, unspecified: Secondary | ICD-10-CM

## 2011-05-25 DIAGNOSIS — M79609 Pain in unspecified limb: Secondary | ICD-10-CM

## 2011-05-25 NOTE — Patient Instructions (Signed)
Pain Medicine Instructions You have been given a prescription for pain medicines. These medicines may affect your ability to think clearly. They may also affect your ability to perform physical activities. Take these medicines only as needed for pain. You do not need to take them if you are not having pain, unless directed by your caregiver. You can take less than the prescribed dose if you find a smaller amount of medicine controls the pain. It may not be possible to make all of your pain go away, but you should be comfortable enough to move, breathe, and take care of yourself. After you start taking pain medicines, while taking the medicines, and for 8 hours after stopping the medicines:  Do not drive.   Do not operate machinery.   Do not operate power tools.   Do not sign legal documents.   Do not supervise children by yourself.   Do not participate in activities that require climbing or being in high places.   Do not enter a body of water (lake, river, ocean, spa, swimming pool) without an adult nearby who can help you.  You may have been prescribed a pain medicine that contains acetaminophen (paracetamol). If so, take only the amount directed by your caregiver. Do not take any other acetaminophen while taking this medicine. An overdose of acetaminophen can result in severe liver damage. If you are taking other medicines, check the active ingredients for acetaminophen. Acetaminophen is found in hundreds of over-the-counter and prescription medicines. These include cold relief products, menstrual cramp relief medicines, fever-reducing medicines, acid indigestion relief products, and pain relief products. HOME CARE INSTRUCTIONS   Do not drink alcohol, take sleeping pills, or take other medicines until at least 8 hours after your last dose of pain medicine, or as directed by your caregiver.   Use a bulk stool softener if you become constipated from your pain medicines. Increasing your intake  of fruits and vegetables will also help.   Write down the times when you take your medicines. Look at the times before taking your next dose of medicine. It is easy to become confused while on pain medicines. Recording the times helps you to avoid an overdose.  SEEK MEDICAL CARE IF:  Your medicine is not helping the pain go away.   You vomit or have diarrhea shortly after taking the medicine.   You develop new pain in areas that did not hurt before.  SEEK IMMEDIATE MEDICAL CARE IF:  You feel dizzy or faint.   You feel there are other problems that might be caused by your medicine.  MAKE SURE YOU:   Understand these instructions.   Will watch your condition.   Will get help right away if you are not doing well or get worse.  Document Released: 10/15/2000 Document Revised: 03/21/2011 Document Reviewed: 06/23/2010 [facility] Patient Information 2012 [place], [place].

## 2011-05-26 LAB — DRUGS OF ABUSE SCREEN W/O ALC, ROUTINE URINE
Amphetamine Screen, Ur: NEGATIVE
Barbiturate Quant, Ur: NEGATIVE
Benzodiazepines.: NEGATIVE
Cocaine Metabolites: POSITIVE — AB
Marijuana Metabolite: POSITIVE — AB
Phencyclidine (PCP): NEGATIVE
Propoxyphene: NEGATIVE

## 2011-05-27 DIAGNOSIS — Z0000 Encounter for general adult medical examination without abnormal findings: Secondary | ICD-10-CM

## 2011-05-27 DIAGNOSIS — M79672 Pain in left foot: Secondary | ICD-10-CM | POA: Insufficient documentation

## 2011-05-27 DIAGNOSIS — M79671 Pain in right foot: Secondary | ICD-10-CM

## 2011-05-27 DIAGNOSIS — R55 Syncope and collapse: Secondary | ICD-10-CM | POA: Insufficient documentation

## 2011-05-27 HISTORY — DX: Encounter for general adult medical examination without abnormal findings: Z00.00

## 2011-05-27 HISTORY — DX: Pain in right foot: M79.671

## 2011-05-27 NOTE — Assessment & Plan Note (Signed)
LFT's slightly elevated. Used to follow up with hepatitis C clinic in town but has not been followed since last 2 years. I offered him access to [facility] hepatitis clinic which he refused at this time.

## 2011-05-27 NOTE — Assessment & Plan Note (Signed)
Standing order for monthly UDS placed in the chart prior to every refill.

## 2011-05-27 NOTE — Assessment & Plan Note (Signed)
On pain meds. Pain contract signed today.

## 2011-05-27 NOTE — Progress Notes (Signed)
  Subjective:    Patient ID: Jonathan [name], male    DOB: 05/11/1956, 55 y.o.   MRN: [id number]  HPI  Jonathan [name] is here today for 6 month follow up, discuss pain contract and concerned about his arm pain.  We discussed in length about the pain contract and he agreed to sign it. He admits using cocaine and marijuana on a regular basis. Patient was seen in the ER 2 months ago for presyncope. His alcohol level was found to be higher than normal.   Patient is complaining of arm pain after lifting furniture few days ago. The pain is vague and present in the both shoulder region. Worse with movement and better with rest. He is using Vicodin for pain control for his foot which helps with the arm pain as well.  He wants to follow up with hepatitis C clinic.  Smoking- Trying to quit, counseled appropriately.  Review of Systems  Constitutional: Negative for fever, activity change and appetite change.  HENT: Negative for sore throat.   Respiratory: Negative for cough and shortness of breath.   Cardiovascular: Negative for chest pain and leg swelling.  Gastrointestinal: Negative for nausea, abdominal pain, diarrhea, constipation and abdominal distention.  Genitourinary: Negative for frequency, hematuria and difficulty urinating.  Musculoskeletal: Positive for arthralgias and gait problem.  Neurological: Negative for dizziness and headaches.  Psychiatric/Behavioral: Negative for suicidal ideas and behavioral problems.       Objective:   Physical Exam  Constitutional: He is oriented to person, place, and time. He appears well-developed and well-nourished.  HENT:  Head: Normocephalic and atraumatic.  Eyes: Conjunctivae and EOM are normal. Pupils are equal, round, and reactive to light. No scleral icterus.  Neck: Normal range of motion. Neck supple. No JVD present. No thyromegaly present.  Cardiovascular: Normal rate, regular rhythm, normal heart sounds and intact distal pulses.  Exam reveals  no gallop and no friction rub.   No murmur heard. Pulmonary/Chest: Effort normal and breath sounds normal. No respiratory distress. He has no wheezes. He has no rales.  Abdominal: Soft. Bowel sounds are normal. He exhibits no distension and no mass. There is no tenderness. There is no rebound and no guarding.  Musculoskeletal: Normal range of motion. He exhibits no edema and no tenderness.       Patient has left foot orthotics in place. No active lesions seen on examination.  Lymphadenopathy:    He has no cervical adenopathy.  Neurological: He is alert and oriented to person, place, and time.  Psychiatric: He has a normal mood and affect. His behavior is normal.          Assessment & Plan:

## 2011-05-29 LAB — CANNABINOIDS CONFIRMATION, URINE: Carboxy Acid THC: 462 NG/ML — ABNORMAL HIGH

## 2011-07-25 ENCOUNTER — Other Ambulatory Visit: Payer: Self-pay | Admitting: Internal Medicine

## 2011-07-30 NOTE — Telephone Encounter (Signed)
Called to pharm 

## 2011-11-26 ENCOUNTER — Other Ambulatory Visit: Payer: Self-pay | Admitting: *Deleted

## 2011-11-26 DIAGNOSIS — I10 Essential (primary) hypertension: Secondary | ICD-10-CM

## 2011-11-28 MED ORDER — LISINOPRIL-HYDROCHLOROTHIAZIDE 20-12.5 MG PO TABS: 2.0000 | ORAL_TABLET | Freq: Every day | ORAL | Status: DC

## 2011-11-28 NOTE — Telephone Encounter (Signed)
Pt is out of meds

## 2011-12-24 ENCOUNTER — Ambulatory Visit: Payer: Medicare Other | Admitting: Internal Medicine

## 2012-10-13 ENCOUNTER — Encounter: Payer: Medicare Other | Admitting: Internal Medicine

## 2012-10-20 ENCOUNTER — Encounter: Payer: Medicare Other | Admitting: Internal Medicine

## 2012-11-10 ENCOUNTER — Encounter: Payer: Medicare Other | Admitting: Internal Medicine

## 2012-12-10 ENCOUNTER — Other Ambulatory Visit: Payer: Self-pay | Admitting: Internal Medicine

## 2013-03-03 ENCOUNTER — Telehealth: Payer: Self-pay | Admitting: *Deleted

## 2013-03-03 NOTE — Telephone Encounter (Signed)
Patient not seen since 2012, with no shows and cancellations since then. Left messages and no call back. Will recommend he be eliminated from the [facility] panel.

## 2013-04-03 ENCOUNTER — Other Ambulatory Visit: Payer: Self-pay | Admitting: Internal Medicine

## 2013-04-03 NOTE — Telephone Encounter (Signed)
Medication refill

## 2013-04-16 ENCOUNTER — Ambulatory Visit: Payer: Medicare Other | Admitting: Internal Medicine

## 2013-04-22 ENCOUNTER — Ambulatory Visit: Payer: Medicare Other | Admitting: Internal Medicine

## 2013-04-23 ENCOUNTER — Ambulatory Visit (INDEPENDENT_AMBULATORY_CARE_PROVIDER_SITE_OTHER): Payer: Medicare Other | Admitting: Internal Medicine

## 2013-04-23 ENCOUNTER — Encounter: Payer: Self-pay | Admitting: Internal Medicine

## 2013-04-23 VITALS — BP 151/98 | HR 81 | Temp 97.2°F

## 2013-04-23 DIAGNOSIS — Z0000 Encounter for general adult medical examination without abnormal findings: Secondary | ICD-10-CM

## 2013-04-23 DIAGNOSIS — B91 Sequelae of poliomyelitis: Secondary | ICD-10-CM

## 2013-04-23 DIAGNOSIS — I10 Essential (primary) hypertension: Secondary | ICD-10-CM

## 2013-04-23 DIAGNOSIS — M79609 Pain in unspecified limb: Secondary | ICD-10-CM

## 2013-04-23 DIAGNOSIS — Z299 Encounter for prophylactic measures, unspecified: Secondary | ICD-10-CM

## 2013-04-23 DIAGNOSIS — F1910 Other psychoactive substance abuse, uncomplicated: Secondary | ICD-10-CM

## 2013-04-23 DIAGNOSIS — Z23 Encounter for immunization: Secondary | ICD-10-CM | POA: Diagnosis not present

## 2013-04-23 DIAGNOSIS — R7402 Elevation of levels of lactic acid dehydrogenase (LDH): Secondary | ICD-10-CM

## 2013-04-23 DIAGNOSIS — F17200 Nicotine dependence, unspecified, uncomplicated: Secondary | ICD-10-CM

## 2013-04-23 DIAGNOSIS — G8929 Other chronic pain: Secondary | ICD-10-CM

## 2013-04-23 DIAGNOSIS — R21 Rash and other nonspecific skin eruption: Secondary | ICD-10-CM

## 2013-04-23 DIAGNOSIS — F17210 Nicotine dependence, cigarettes, uncomplicated: Secondary | ICD-10-CM

## 2013-04-23 DIAGNOSIS — L84 Corns and callosities: Secondary | ICD-10-CM

## 2013-04-23 DIAGNOSIS — B1710 Acute hepatitis C without hepatic coma: Secondary | ICD-10-CM

## 2013-04-23 HISTORY — DX: Other psychoactive substance abuse, uncomplicated: F19.10

## 2013-04-23 LAB — COMPLETE METABOLIC PANEL WITH GFR
ALT: 91 U/L — ABNORMAL HIGH (ref 0–53)
Alkaline Phosphatase: 57 U/L (ref 39–117)
CO2: 29 mEq/L (ref 19–32)
Sodium: 138 mEq/L (ref 135–145)
Total Bilirubin: 0.7 mg/dL (ref 0.3–1.2)
Total Protein: 7.5 g/dL (ref 6.0–8.3)

## 2013-04-23 MED ORDER — LISINOPRIL-HYDROCHLOROTHIAZIDE 20-25 MG PO TABS: 1.0000 | ORAL_TABLET | Freq: Every day | ORAL | Status: DC

## 2013-04-23 MED ORDER — CLOTRIMAZOLE-BETAMETHASONE 1-0.05 % EX CREA: TOPICAL_CREAM | Freq: Two times a day (BID) | CUTANEOUS | Status: DC

## 2013-04-23 NOTE — Assessment & Plan Note (Signed)
BP Readings from Last 3 Encounters:  04/23/13 151/98  05/25/11 107/69  12/15/10 110/74    Lab Results  Component Value Date   NA 141 03/24/2011   K 3.1* 03/24/2011   CREATININE 1.24 03/24/2011    Assessment: Blood pressure control: moderately elevated Progress toward BP goal:  deteriorated Comments: check BP at home from time to time. Reports compliance with meds   Plan: Medications:  Increase lisinopril-hydrochlorothiazide from 10 mg b- 12.5 mg daily to 20 mg-25 mg daily Educational resources provided: brochure Self management tools provided: home blood pressure logbook Other plans: BMP today Followup in 2 weeks and if blood pressure remains elevated will consider adding another hypertension medication like amlodipine

## 2013-04-23 NOTE — Assessment & Plan Note (Signed)
Advised patient to quit cigarettes.

## 2013-04-23 NOTE — Progress Notes (Signed)
Patient ID: Jonathan [name], male   DOB: 1955/09/16, 57 y.o.   MRN: [id number]   Subjective:   HPI: Jonathan [name] is a 57 y.o. gentleman with past medical history of chronic bilateral foot pain, hypertension, hepatitis C, and polysubstance abuse, presents to the clinic with complaints of pain in his feet and a rash.  Bilateral foot pain: This is a chronic problem related to postpolio syndrome with history of constructive surgeries on his feet. He uses a brace on his left foot which has not been changed in many years. He takes ibuprofen 600 -1000 mg twice daily for pain and sometime he uses Aleve. Previously was on Vicodin but he is not been using it for more than 2 yrs. Patient reports significant pain on walking. He denies any other symptoms, like fevers, chills and the pain has not worsened recently.  Rash:  Patient reports a 2 weeks history of left popliteal rash which is has been itchy. He has used Benadryl cream without relief. He doesn't have the usual allergies with rash. No new meds and this is the first time he has had a rash. Not recent camping, or insect bites. No contact with persons with similar rash.    Kindly see the A&P for the status of the pt's chronic medical problems.    Past Medical History  Diagnosis Date  . ERECTILE DYSFUNCTION 06/05/2006    Qualifier: Diagnosis of  By: Janee Morn MD, Reuel Boom     Current Outpatient Prescriptions  Medication Sig Dispense Refill  . clotrimazole-betamethasone (LOTRISONE) cream Apply topically 2 (two) times daily.  30 g  0  . lisinopril-hydrochlorothiazide (PRINZIDE,ZESTORETIC) 20-25 MG per tablet Take 1 tablet by mouth daily.  60 tablet  3   No current facility-administered medications for this visit.   No family history on file. History   Social History  . Marital Status: Single    Spouse Name: N/A    Number of Children: N/A  . Years of Education: N/A   Social History Main Topics  . Smoking status: Current Every Day  Smoker -- 0.50 packs/day    Types: Cigarettes  . Smokeless tobacco: None  . Alcohol Use: None  . Drug Use: None  . Sexual Activity: None   Other Topics Concern  . None   Social History Narrative  . None   Review of Systems: Constitutional: Denies fever, chills, diaphoresis, appetite change and fatigue.  Respiratory: Denies SOB, DOE, cough, chest tightness, and wheezing.  Cardiovascular: No chest pain, palpitations and leg swelling.  Gastrointestinal: No abdominal pain, nausea, vomiting, bloody stools Genitourinary: No dysuria, frequency, hematuria, or flank pain.  Musculoskeletal: No myalgias, back pain, joint swelling, arthralgias    Objective:  Physical Exam: Filed Vitals:   04/23/13 0951  BP: 151/98  Pulse: 81  Temp: 97.2 F (36.2 C)  TempSrc: Oral  SpO2: 100%   General: Well nourished. No acute distress. Small area or a raised rash on forehead >1cm in diameter with area of dryness.   Lungs: CTA bilaterally. Heart: RRR; no extra sounds or murmurs  Abdomen: Non-distended, normal BS, soft, nontender; no hepatosplenomegaly  Extremities: No pedal edema. No joint swelling or tenderness. Chronic deformities of feet bilaterally with corns and callus formations. Uses a brace on left side and uses a cane as well. Pulse presents and normal sensation. There is a maculopapular rash in the left popliteal fossa. No erythema, but skin looks dry.  Neurologic: Alert and oriented x3. No obvious neurologic deficits. Walks with a  limp.  Assessment & Plan:  I have discussed my assessment and plan  with Dr. Dalphine Handing  as detailed under problem based charting.

## 2013-04-23 NOTE — Assessment & Plan Note (Signed)
Referral to hepatitis C. clinic. Will check his liver enzymes today

## 2013-04-23 NOTE — Assessment & Plan Note (Signed)
Admits to active use of cocaine and marijuana along with alcohol daily. He's not a candidate for chronic pain prescription from our clinic. Counseled him on cessation of substance abuse. See under chronic bilateral foot pain.

## 2013-04-23 NOTE — Patient Instructions (Addendum)
General Instructions: Please increase your Prinzide to 20-25 mg daily  Please use the creams as prescribed I will refer you to Podiatry, Physical therapy and Hepatitis C clinic  Please do no take the over the counter pain medications as these can affect your blood pressure  Please come back in 2 weeks to see how things are going.  It was nice meeting you!    Treatment Goals:  Goals (1 Years of Data) as of 04/23/13         As of Today 05/25/11     Blood Pressure    . Blood Pressure < 140/90  151/98 107/69      Progress Toward Treatment Goals:  Treatment Goal 04/23/2013  Blood pressure deteriorated  Stop smoking smoking the same amount    Self Care Goals & Plans:  Self Care Goal 04/23/2013  Manage my medications take my medicines as prescribed; bring my medications to every visit; refill my medications on time  Monitor my health check my feet daily  Eat healthy foods eat foods that are low in salt; eat baked foods instead of fried foods  Be physically active find an activity I enjoy  Stop smoking cut down the number of cigarettes smoked; go to the [employer] ([email or web address]       Care Management & Community Referrals:

## 2013-04-23 NOTE — Assessment & Plan Note (Addendum)
Rash likely secondary to dermatitis. Difficult to exclude fungal infection. Plan. -Prescribed clotrimazole-betamethasone cream

## 2013-04-23 NOTE — Assessment & Plan Note (Signed)
Patient admits to active cocaine uses with last use with prev 7 days. Discussed with him that NSAIDs are not good pain medications with HTN and potential side effects. Nullified previous pain contract. Plan. - Patient declines UDS today. I explained to the patient that he has to give up drugs before we can consider chronic opiates. Patient politely verbalized understanding. - Not a candidate for chronic opiates for now. Advised the patient to use Tylenol anymore duration for his pain. Advised the patient to stop using high doses of ibuprofen and Aleve - Referral to podiatry - Referral to physical therapy for one time evaluation for braces

## 2013-04-27 NOTE — Progress Notes (Signed)
I saw and evaluated the patient.  I personally confirmed the key portions of the history and exam documented by Dr. Kazibwe and I reviewed pertinent patient test results.  The assessment, diagnosis, and plan were formulated together and I agree with the documentation in the resident's note. 

## 2013-05-06 ENCOUNTER — Ambulatory Visit: Payer: Medicare Other | Attending: Internal Medicine

## 2013-05-06 DIAGNOSIS — R262 Difficulty in walking, not elsewhere classified: Secondary | ICD-10-CM | POA: Diagnosis not present

## 2013-05-06 DIAGNOSIS — M25676 Stiffness of unspecified foot, not elsewhere classified: Secondary | ICD-10-CM | POA: Insufficient documentation

## 2013-05-06 DIAGNOSIS — M25673 Stiffness of unspecified ankle, not elsewhere classified: Secondary | ICD-10-CM | POA: Diagnosis not present

## 2013-05-06 DIAGNOSIS — IMO0001 Reserved for inherently not codable concepts without codable children: Secondary | ICD-10-CM | POA: Diagnosis not present

## 2013-05-06 DIAGNOSIS — M25579 Pain in unspecified ankle and joints of unspecified foot: Secondary | ICD-10-CM | POA: Diagnosis not present

## 2013-05-06 DIAGNOSIS — M79609 Pain in unspecified limb: Secondary | ICD-10-CM | POA: Insufficient documentation

## 2013-05-06 DIAGNOSIS — M6281 Muscle weakness (generalized): Secondary | ICD-10-CM | POA: Diagnosis not present

## 2013-05-07 ENCOUNTER — Ambulatory Visit: Payer: Medicare Other | Admitting: Internal Medicine

## 2013-05-07 ENCOUNTER — Encounter: Payer: Self-pay | Admitting: Internal Medicine

## 2013-05-18 ENCOUNTER — Encounter: Payer: Self-pay | Admitting: Internal Medicine

## 2013-05-18 ENCOUNTER — Ambulatory Visit (INDEPENDENT_AMBULATORY_CARE_PROVIDER_SITE_OTHER): Payer: Medicare Other | Admitting: Internal Medicine

## 2013-05-18 VITALS — BP 146/99 | HR 74 | Temp 97.1°F | Ht 73.0 in | Wt 167.3 lb

## 2013-05-18 DIAGNOSIS — R21 Rash and other nonspecific skin eruption: Secondary | ICD-10-CM | POA: Diagnosis not present

## 2013-05-18 DIAGNOSIS — M79609 Pain in unspecified limb: Secondary | ICD-10-CM

## 2013-05-18 DIAGNOSIS — B1710 Acute hepatitis C without hepatic coma: Secondary | ICD-10-CM

## 2013-05-18 DIAGNOSIS — I10 Essential (primary) hypertension: Secondary | ICD-10-CM

## 2013-05-18 DIAGNOSIS — M79671 Pain in right foot: Secondary | ICD-10-CM

## 2013-05-18 DIAGNOSIS — B91 Sequelae of poliomyelitis: Secondary | ICD-10-CM | POA: Diagnosis not present

## 2013-05-18 LAB — BASIC METABOLIC PANEL WITH GFR
BUN: 17 mg/dL (ref 6–23)
CO2: 26 mEq/L (ref 19–32)
Chloride: 107 mEq/L (ref 96–112)
Creat: 1.19 mg/dL (ref 0.50–1.35)

## 2013-05-18 MED ORDER — HYDROCORTISONE 1 % EX CREA: TOPICAL_CREAM | CUTANEOUS | Status: AC

## 2013-05-18 MED ORDER — AMLODIPINE BESYLATE 5 MG PO TABS: 5.0000 mg | ORAL_TABLET | Freq: Every day | ORAL | Status: DC

## 2013-05-18 NOTE — Patient Instructions (Addendum)
General Instructions: Please take your medication including the morning of your appointment  I have added Amlodipine to your medication. Please take 1 pill at bedtime (5 mg) Please come back after 2 weeks to make sure your blood pressure is well controlled.  We will try hydrocortisone cream to see if this will be more affordable to you. Otherwise, follow up with your appointments with the foot doctor     Treatment Goals:  Goals (1 Years of Data) as of 05/18/13         As of Today As of Today 04/23/13 05/25/11     Blood Pressure    . Blood Pressure < 140/90  146/99 140/95 151/98 107/69      Progress Toward Treatment Goals:  Treatment Goal 05/18/2013  Blood pressure improved  Stop smoking smoking the same amount    Self Care Goals & Plans:  Self Care Goal 05/18/2013  Manage my medications take my medicines as prescribed; bring my medications to every visit; refill my medications on time  Monitor my health -  Eat healthy foods drink diet soda or water instead of juice or soda; eat more vegetables; eat foods that are low in salt; eat baked foods instead of fried foods  Be physically active -  Stop smoking go to the [employer] ([email or web address])    No flowsheet data found.   Care Management & Community Referrals:  No flowsheet data found.

## 2013-05-18 NOTE — Assessment & Plan Note (Signed)
Discussed about referral to pain clinic in the future. Emphasized need to get off drugs since clinic will not accept him with active drug abuse. We will cont with discussion on pain management in future.

## 2013-05-18 NOTE — Progress Notes (Signed)
Case discussed with Dr. Kazibwe soon after the resident saw the patient.  We reviewed the resident's history and exam and pertinent patient test results.  I agree with the assessment, diagnosis and plan of care documented in the resident's note. 

## 2013-05-18 NOTE — Assessment & Plan Note (Signed)
He was evaluated by physical therapy for new braces. He is waiting to hear from them  He has an appt with Podiatry on 05/22/2013.  Will follow up in 2 weeks

## 2013-05-18 NOTE — Assessment & Plan Note (Signed)
BP Readings from Last 3 Encounters:  05/18/13 146/99  04/23/13 151/98  05/25/11 107/69    Lab Results  Component Value Date   NA 138 04/23/2013   K 3.8 04/23/2013   CREATININE 1.20 04/23/2013    Assessment: Blood pressure control: mildly elevated Progress toward BP goal:  improved Comments: did not take meds this am.   Plan: Medications:  add Amlodipine 5 mg daily. cont with Lisinopril-HCTZ 20-25mg  daily  Educational resources provided: brochure Self management tools provided: home blood pressure logbook;instructions for home blood pressure monitoring Other plans:  Encouraged him to take his medications on morning of his office visits BMP today  F/u in 2 weeks

## 2013-05-18 NOTE — Assessment & Plan Note (Addendum)
Encouraged him to keep appt with Hep C clinic in 06/2013

## 2013-05-18 NOTE — Progress Notes (Signed)
Patient ID: Jonathan [name], male   DOB: 03-18-1956, 57 y.o.   MRN: [id number]   Subjective:   HPI: [name] is a 57 y.o. gentleman with past medical history of chronic bilateral foot pain, hypertension, hepatitis C, and polysubstance abuse, presents to the clinic for a f/u appt.   Last clinic visit was 04/23/2013 with me. During that visit, I increased his lisinopril-HCTZ to 20-25 mg once daily. Patient reports that his been compliant with his medication, but did not take it on the morning of his clinic visit. BP = 146/99 today.   Due to cost, he was unable to get his antifungal/steroid cream for the rash on his forehead and the right popliteal fossa. He has been using Benadryl cream for his rashes but without much improvement. He has no new complaints today.   Kindly see the A&P for the status of the pt's chronic medical problems.    Past Medical History  Diagnosis Date  . ERECTILE DYSFUNCTION 06/05/2006    Qualifier: Diagnosis of  By: Janee Morn MD, Reuel Boom     Current Outpatient Prescriptions  Medication Sig Dispense Refill  . amLODipine (NORVASC) 5 MG tablet Take 1 tablet (5 mg total) by mouth daily.  30 tablet  2  . hydrocortisone cream 1 % Apply to affected area 2 times daily  30 g  1  . lisinopril-hydrochlorothiazide (PRINZIDE,ZESTORETIC) 20-25 MG per tablet Take 1 tablet by mouth daily.  60 tablet  3   No current facility-administered medications for this visit.   No family history on file. History   Social History  . Marital Status: Single    Spouse Name: N/A    Number of Children: N/A  . Years of Education: N/A   Social History Main Topics  . Smoking status: Current Every Day Smoker -- 0.30 packs/day    Types: Cigarettes  . Smokeless tobacco: None  . Alcohol Use: None  . Drug Use: None  . Sexual Activity: None   Other Topics Concern  . None   Social History Narrative  . None   Review of Systems: Constitutional: Denies fever, chills, diaphoresis,  appetite change and fatigue.  Respiratory: Denies SOB, DOE, cough, chest tightness, and wheezing.  Cardiovascular: No chest pain, palpitations and leg swelling.  Gastrointestinal: No abdominal pain, nausea, vomiting, bloody stools Genitourinary: No dysuria, frequency, hematuria, or flank pain.  Musculoskeletal: No myalgias, back pain, joint swelling, arthralgias    Objective:  Physical Exam: Filed Vitals:   05/18/13 1024 05/18/13 1056  BP: 140/95 146/99  Pulse: 80 74  Temp: 97.1 F (36.2 C)   TempSrc: Oral   Height: 6\' 1"  (1.854 m)   Weight: 167 lb 4.8 oz (75.887 kg)   SpO2: 99%    General: Well nourished. No acute distress. Small area or a raised rash on forehead >1cm in diameter with area of dryness >>unchanged from his last visit.   Lungs: CTA bilaterally. Heart: RRR; no extra sounds or murmurs  Abdomen: Non-distended, normal BS, soft, nontender; no hepatosplenomegaly  Extremities: No pedal edema. No joint swelling or tenderness.  Chronic deformities of feet bilaterally with corns and callus formations. Uses a brace on left side and uses a cane as well. Pulse presents and normal sensation. There is a maculopapular rash in the left popliteal fossa. No erythema, but skin looks dry. It appears better compared to his last visit.  Neurologic: Alert and oriented x3. No obvious neurologic deficits. Antalgic gait, using a walking cane.  Assessment & Plan:  I have discussed my assessment and plan  with Dr. Josem Kaufmann as detailed under problem based charting.

## 2013-05-18 NOTE — Assessment & Plan Note (Signed)
Assessment: Most like diagnosis: topical eczema in view of appearance on exam.  Ddx: skin fungal infection, though this looks less likely.  Pt can not afford Lotrisone rec on last visit.    Plan: 1. Labs/imaging: none 2. Therapy: Will call in hydrocortisone 1 % cream since rash does not appear to be fungal  3. Follow up: 2 weeks

## 2013-05-22 ENCOUNTER — Ambulatory Visit (INDEPENDENT_AMBULATORY_CARE_PROVIDER_SITE_OTHER): Payer: Medicare Other

## 2013-05-22 ENCOUNTER — Ambulatory Visit: Payer: Self-pay | Admitting: Podiatrist

## 2013-05-22 ENCOUNTER — Ambulatory Visit (INDEPENDENT_AMBULATORY_CARE_PROVIDER_SITE_OTHER): Payer: Medicare Other | Admitting: Podiatrist

## 2013-05-22 ENCOUNTER — Encounter: Payer: Self-pay | Admitting: Podiatrist

## 2013-05-22 VITALS — BP 122/79 | HR 69 | Resp 20 | Ht 73.0 in | Wt 167.0 lb

## 2013-05-22 DIAGNOSIS — L84 Corns and callosities: Secondary | ICD-10-CM | POA: Diagnosis not present

## 2013-05-22 DIAGNOSIS — M2040 Other hammer toe(s) (acquired), unspecified foot: Secondary | ICD-10-CM | POA: Diagnosis not present

## 2013-05-22 DIAGNOSIS — B91 Sequelae of poliomyelitis: Secondary | ICD-10-CM | POA: Diagnosis not present

## 2013-05-22 DIAGNOSIS — M79671 Pain in right foot: Secondary | ICD-10-CM

## 2013-05-22 DIAGNOSIS — M79609 Pain in unspecified limb: Secondary | ICD-10-CM

## 2013-05-22 NOTE — Patient Instructions (Signed)

## 2013-05-22 NOTE — Progress Notes (Signed)
  Subjective:    Patient ID: Jonathan [name], male    DOB: 10-16-1955, 57 y.o.   MRN: [id number] "I have corns and callouses.  I need a new brace, I have drop foot on the left, it's cracked." Patient presents today complaining of multiple calluses on bilateral feet. Patient also has a dropfoot deformity on the left foot due to previous polio.  Foot Pain This is a new problem. The current episode started more than 1 year ago. The problem occurs constantly. The problem has been gradually worsening. The symptoms are aggravated by standing and walking. He has tried heat for the symptoms. The treatment provided no relief.      Review of Systems     Objective:   Physical Exam GENERAL APPEARANCE: Alert, conversant. Appropriately groomed. No acute distress.  VASCULAR: Pedal pulses palpable and strong bilateral.  Capillary refill time is immediate to all digits,  Proximal to distal cooling it warm to warm.  Digital hair growth is present bilateral  NEUROLOGIC: sensation is intact epicritically and protectively to 5.07 monofilament at 5/5 sites bilateral.  Light touch is intact bilateral, vibratory sensation intact bilateral, achilles tendon reflex is intact bilateral.  MUSCULOSKELETAL: Dropfoot deformity noted left foot. I arched foot type cavus foot deformity noted bilateral. Contraction deformity of lesser digits is also seen bilateral .   DERMATOLOGIC: Hyperkeratotic lesions are multiple and noted on the left hallux medial side, submetatarsal 5 left;  right foot medial hallux, plantar right fifth toe medial submetatarsal one of the right foot.  No drainage or malodor no sign of infection present. All lesions deeply enucleated with ground glass appearance noted.       Assessment & Plan     [place]  Assessment: Dropfoot deformity, plantarflexed metatarsal with porokeratotic lesion x5 Plan: A prescription for him to [facility]foot [facility].  Debrided  all lesions with a #15 blade and chisel blade without complication he will be seen back every 4-6 weeks for routine care

## 2013-07-03 ENCOUNTER — Ambulatory Visit: Payer: Medicare Other

## 2013-11-16 ENCOUNTER — Encounter: Payer: Self-pay | Admitting: Internal Medicine

## 2013-11-30 DIAGNOSIS — B182 Chronic viral hepatitis C: Secondary | ICD-10-CM | POA: Diagnosis not present

## 2013-12-01 ENCOUNTER — Other Ambulatory Visit: Payer: Self-pay | Admitting: Nurse Practitioner

## 2013-12-01 DIAGNOSIS — C220 Liver cell carcinoma: Secondary | ICD-10-CM

## 2013-12-04 ENCOUNTER — Ambulatory Visit
Admission: RE | Admit: 2013-12-04 | Discharge: 2013-12-04 | Disposition: A | Discharge status: Home / Self Care | Payer: Medicare Other | Source: Ambulatory Visit | Attending: Nurse Practitioner | Admitting: Nurse Practitioner

## 2013-12-04 DIAGNOSIS — B182 Chronic viral hepatitis C: Secondary | ICD-10-CM | POA: Diagnosis not present

## 2013-12-04 DIAGNOSIS — C220 Liver cell carcinoma: Secondary | ICD-10-CM

## 2014-01-27 ENCOUNTER — Other Ambulatory Visit (HOSPITAL_COMMUNITY)
Admission: RE | Admit: 2014-01-27 | Discharge: 2014-01-27 | Disposition: A | Discharge status: Home / Self Care | Payer: Medicare Other | Source: Ambulatory Visit | Attending: Emergency Medicine | Admitting: Emergency Medicine

## 2014-01-27 ENCOUNTER — Emergency Department (INDEPENDENT_AMBULATORY_CARE_PROVIDER_SITE_OTHER)
Admission: EM | Admit: 2014-01-27 | Discharge: 2014-01-27 | Disposition: A | Discharge status: Home / Self Care | Payer: Medicare Other | Source: Home / Self Care

## 2014-01-27 ENCOUNTER — Encounter (HOSPITAL_COMMUNITY): Payer: Self-pay | Admitting: Emergency Medicine

## 2014-01-27 DIAGNOSIS — Z113 Encounter for screening for infections with a predominantly sexual mode of transmission: Secondary | ICD-10-CM | POA: Insufficient documentation

## 2014-01-27 DIAGNOSIS — Z202 Contact with and (suspected) exposure to infections with a predominantly sexual mode of transmission: Secondary | ICD-10-CM

## 2014-01-27 MED ORDER — TINIDAZOLE 500 MG PO TABS: ORAL_TABLET | ORAL | Status: DC

## 2014-01-27 NOTE — ED Provider Notes (Signed)
CSN: [id number]     Arrival date & time 01/27/14  1011 History   First MD Initiated Contact with Patient 01/27/14 1107     Chief Complaint  Patient presents with  . Exposure to STD   (Consider location/radiation/quality/duration/timing/severity/associated sxs/prior Treatment) HPI Comments: Sexually active male with multiple partners st one of them called and st she had trich. Wants to get chk and treated. Denies sx's.   Past Medical History  Diagnosis Date  . ERECTILE DYSFUNCTION 06/05/2006    Qualifier: Diagnosis of  By: Grandville Silos MD, Quillian Quince    . Hypertension    Past Surgical History  Procedure Laterality Date  . Knee arthroscopy Right   . Fracture surgery     Family History  Problem Relation Age of Onset  . Cancer Mother    History  Substance Use Topics  . Smoking status: Current Every Day Smoker -- 0.30 packs/day    Types: Cigarettes  . Smokeless tobacco: Not on file  . Alcohol Use: Yes     Comment: social    Review of Systems  All other systems reviewed and are negative.   Allergies  Review of patient's allergies indicates no known allergies.  Home Medications   Prior to Admission medications   Medication Sig Start Date End Date Taking? Authorizing Provider  amLODipine (NORVASC) 5 MG tablet Take 1 tablet (5 mg total) by mouth daily. 05/18/13 05/18/14  Jessee Avers, MD  hydrocortisone cream 1 % Apply to affected area 2 times daily 05/18/13 05/18/14  Jessee Avers, MD  lisinopril-hydrochlorothiazide (PRINZIDE,ZESTORETIC) 20-25 MG per tablet Take 1 tablet by mouth daily. 04/23/13   Jessee Avers, MD  tinidazole (TINDAMAX) 500 MG tablet Take all 4 tablets po stat 01/27/14   Janne Napoleon, NP   BP 155/99  Pulse 86  Temp(Src) 98.2 F (36.8 C) (Oral)  Resp 16  SpO2 100% Physical Exam  Nursing note and vitals reviewed. Constitutional: He is oriented to person, place, and time. He appears well-nourished. No distress.  Genitourinary: Penis normal. No penile  tenderness.  Neurological: He is alert and oriented to person, place, and time.  Skin: Skin is warm and dry.  Psychiatric: He has a normal mood and affect.    ED Course  Procedures (including critical care time) Labs Review Labs Reviewed  URINE CYTOLOGY ANCILLARY ONLY    Imaging Review No results found.   MDM   1. STD exposure    tindamax as requested 2 gm po stat per Rx Instructions for dx and STD Cytology pending    Janne Napoleon, NP 01/27/14 1118

## 2014-01-27 NOTE — ED Notes (Signed)
Pt  States  May  Have  Been  Exposed  To trich   -  denys  Any  Symptoms

## 2014-01-28 NOTE — ED Provider Notes (Signed)
Medical screening examination/treatment/procedure(s) were performed by non-physician practitioner and as supervising physician I was immediately available for consultation/collaboration.  Philipp Deputy, M.D.  Harden Mo, MD 01/28/14 [phone]

## 2014-02-03 ENCOUNTER — Ambulatory Visit (INDEPENDENT_AMBULATORY_CARE_PROVIDER_SITE_OTHER): Payer: Medicare Other | Admitting: Internal Medicine

## 2014-02-03 ENCOUNTER — Encounter: Payer: Self-pay | Admitting: Internal Medicine

## 2014-02-03 VITALS — BP 134/91 | HR 71 | Temp 97.3°F | Ht 73.0 in | Wt 164.8 lb

## 2014-02-03 DIAGNOSIS — R21 Rash and other nonspecific skin eruption: Secondary | ICD-10-CM | POA: Diagnosis not present

## 2014-02-03 DIAGNOSIS — B1710 Acute hepatitis C without hepatic coma: Secondary | ICD-10-CM

## 2014-02-03 DIAGNOSIS — B91 Sequelae of poliomyelitis: Secondary | ICD-10-CM

## 2014-02-03 DIAGNOSIS — R7402 Elevation of levels of lactic acid dehydrogenase (LDH): Secondary | ICD-10-CM

## 2014-02-03 DIAGNOSIS — R740 Nonspecific elevation of levels of transaminase and lactic acid dehydrogenase [LDH]: Secondary | ICD-10-CM

## 2014-02-03 DIAGNOSIS — M79609 Pain in unspecified limb: Secondary | ICD-10-CM | POA: Diagnosis not present

## 2014-02-03 DIAGNOSIS — I10 Essential (primary) hypertension: Secondary | ICD-10-CM

## 2014-02-03 DIAGNOSIS — F1910 Other psychoactive substance abuse, uncomplicated: Secondary | ICD-10-CM

## 2014-02-03 DIAGNOSIS — F17210 Nicotine dependence, cigarettes, uncomplicated: Secondary | ICD-10-CM

## 2014-02-03 DIAGNOSIS — B1920 Unspecified viral hepatitis C without hepatic coma: Secondary | ICD-10-CM

## 2014-02-03 DIAGNOSIS — F17200 Nicotine dependence, unspecified, uncomplicated: Secondary | ICD-10-CM

## 2014-02-03 MED ORDER — HEPATITIS B VAC RECOMBINANT 5 MCG/0.5ML IJ SUSP: 0.5000 mL | Freq: Once | INTRAMUSCULAR | Status: DC

## 2014-02-03 MED ORDER — LISINOPRIL-HYDROCHLOROTHIAZIDE 20-25 MG PO TABS: 1.0000 | ORAL_TABLET | Freq: Every day | ORAL | Status: DC

## 2014-02-03 MED ORDER — HEPATITIS B VAC RECOMBINANT 5 MCG/0.5ML IJ SUSP
1.0000 mL | Freq: Once | INTRAMUSCULAR | Status: AC
  Administered 2014-02-03: 10 ug via INTRAMUSCULAR

## 2014-02-03 MED ORDER — AMLODIPINE BESYLATE 5 MG PO TABS: 5.0000 mg | ORAL_TABLET | Freq: Every day | ORAL | Status: DC

## 2014-02-03 NOTE — Assessment & Plan Note (Signed)
BP Readings from Last 3 Encounters:  02/03/14 134/91  01/27/14 155/99  05/22/13 122/79    Lab Results  Component Value Date   NA 138 05/18/2013   K 3.8 05/18/2013   CREATININE 1.19 05/18/2013    Assessment: Blood pressure control: controlled Progress toward BP goal:  at goal Comments: he has been off Norvasc for ~1 month  Plan: Medications:  Continue with amlodipine 5 mg daily. Continue with lisinopril-hydrochlorothiazide 20-25 mg daily Educational resources provided:   Self management tools provided:   Other plans: I encouraged the patient to refill his medications on time.

## 2014-02-03 NOTE — Assessment & Plan Note (Signed)
He would like to quit smoking. Provided him with materials to obtain nicotine patches.

## 2014-02-03 NOTE — Patient Instructions (Signed)
General Instructions: Please take your medications as before  Please follow up with hepatitic C clinic   Treatment Goals:  Goals (1 Years of Data) as of 02/03/14         As of Today 01/27/14 05/22/13 05/18/13 05/18/13     Blood Pressure    . Blood Pressure < 140/90  134/91 155/99 122/79 146/99 140/95      Progress Toward Treatment Goals:  Treatment Goal 02/03/2014  Blood pressure at goal  Stop smoking smoking the same amount    Self Care Goals & Plans:  Self Care Goal 02/03/2014  Manage my medications refill my medications on time; bring my medications to every visit; take my medicines as prescribed  Monitor my health -  Eat healthy foods -  Be physically active find an activity I enjoy  Stop smoking go to the [employer] (https://scott-book[employer].info/); call [facility] (1-800-QUIT-NOW)    No flowsheet data found.   Care Management & Community Referrals:  No flowsheet data found.

## 2014-02-03 NOTE — Progress Notes (Signed)
Patient ID: GEARALD STONEBRAKER, male   DOB: 1956/06/15, 58 y.o.   MRN: [id number]   Subjective:   HPI: Mr.Renee L Deardorff is a 58 y.o. gentleman with past medical history of chronic bilateral foot pain, hypertension, hepatitis C, and polysubstance abuse, presents to the clinic for a f/u appt.   Last clinic visit was 05/18/2013 with me. He has been doing well since his last clinic visit. He follows up with hepatitis C clinic and his most recent ultrasound scan did not show fibrosis, or any hepatic lesions. There was no gallstones. He had a lot of questions regarding his lab results which were performed during his hepatitis C clinic visit recently, but these results are not available to me. He has an appointment with the hepatitis C clinic on 03/03/2014. I encouraged him to request his physician to discuss these results with him. According to his last hepatitis C clinic visit note, he was recommended to obtain, hepatitis B vaccine series, but he demonstrated immunity against hepatitis A.  No new complaints since his last visit.   ROS: Constitutional: Denies fever, chills, diaphoresis, appetite change and fatigue.  Respiratory: Denies SOB, DOE, cough, chest tightness, and wheezing. Denies chest pain. CVS: No chest pain, palpitations and leg swelling.  GI: No abdominal pain, nausea, vomiting, bloody stools GU: No dysuria, frequency, hematuria, or flank pain.  MSK: Patient reports recurrent pains in his legs, which he attributes to his history of polio. He acquired a new braces for his legs, which has been using on and off. No joint swelling or arthralgias  Psych: No depression symptoms. No SI or SA.   Past Medical History  Diagnosis Date  . ERECTILE DYSFUNCTION 06/05/2006    Qualifier: Diagnosis of  By: Grandville Silos MD, Quillian Quince    . Hypertension    Current Outpatient Prescriptions  Medication Sig Dispense Refill  . amLODipine (NORVASC) 5 MG tablet Take 1 tablet (5 mg total) by mouth daily.  60  tablet  2  . hydrocortisone cream 1 % Apply to affected area 2 times daily  30 g  1  . lisinopril-hydrochlorothiazide (PRINZIDE,ZESTORETIC) 20-25 MG per tablet Take 1 tablet by mouth daily.  60 tablet  3   No current facility-administered medications for this visit.   Family History  Problem Relation Age of Onset  . Cancer Mother    History   Social History  . Marital Status: Single    Spouse Name: N/A    Number of Children: N/A  . Years of Education: N/A   Social History Main Topics  . Smoking status: Current Every Day Smoker -- 0.30 packs/day    Types: Cigarettes  . Smokeless tobacco: Not on file  . Alcohol Use: Yes     Comment: social  . Drug Use: Yes     Comment: marijuana  . Sexual Activity: Not on file   Other Topics Concern  . Not on file   Social History Narrative  . No narrative on file    Objective:  Physical Exam: Filed Vitals:   02/03/14 1600  BP: 134/91  Pulse: 71  Temp: 97.3 F (36.3 C)  TempSrc: Oral  Height: 6\' 1"  (1.854 m)  Weight: 164 lb 12.8 oz (74.753 kg)  SpO2: 100%   General: Well nourished. No acute distress.  HEENT: Normal oral mucosa. MMM.  Lungs: CTA bilaterally. Heart: RRR; no extra sounds or murmurs  Abdomen: Non-distended, normal BS, soft, nontender; no hepatosplenomegaly  Extremities: No pedal edema. No joint swelling or tenderness.  Chronic deformities of feet bilaterally with corns and callus formations. Uses a brace on left side and uses a cane as well. Pulse presents and normal sensation. There is a maculopapular rash in the left popliteal fossa. No erythema, but skin looks dry. It appears better compared to his last visit.  Neurologic: Alert and oriented x3. No obvious neurologic deficits. Antalgic gait, using a walking cane.  Assessment & Plan:  I have discussed my assessment and plan  with  my attending in the clinic, Dr. Dareen Piano as detailed under problem based charting.

## 2014-02-03 NOTE — Assessment & Plan Note (Signed)
He is currently following up with hepatitis C clinic. Encouraged him to continue and keep his appointment on 03/03/2014. Plan. -Start hepatitis B vaccination series today. -Will continue to follow along signed the hepatitis C clinic.

## 2014-02-03 NOTE — Assessment & Plan Note (Signed)
Counseled him about risks of continuous substance abuse. He would like to quit.

## 2014-02-04 NOTE — Progress Notes (Signed)
INTERNAL MEDICINE TEACHING ATTENDING ADDENDUM - Aldine Contes, MD: I reviewed and discussed at the time of visit with the resident Dr. Alice Rieger, the patient's medical history, physical examination, diagnosis and results of tests and treatment and I agree with the patient's care as documented.

## 2014-03-10 ENCOUNTER — Encounter: Payer: Self-pay | Admitting: Internal Medicine

## 2014-03-10 ENCOUNTER — Encounter: Payer: Medicare Other | Admitting: Internal Medicine

## 2014-04-23 ENCOUNTER — Encounter: Payer: Self-pay | Admitting: Internal Medicine

## 2014-04-23 ENCOUNTER — Ambulatory Visit (INDEPENDENT_AMBULATORY_CARE_PROVIDER_SITE_OTHER): Payer: Medicare Other | Admitting: Internal Medicine

## 2014-04-23 VITALS — BP 140/80 | HR 60 | Temp 97.7°F | Ht 73.0 in | Wt 163.6 lb

## 2014-04-23 DIAGNOSIS — Z23 Encounter for immunization: Secondary | ICD-10-CM | POA: Diagnosis not present

## 2014-04-23 DIAGNOSIS — B1920 Unspecified viral hepatitis C without hepatic coma: Secondary | ICD-10-CM | POA: Diagnosis not present

## 2014-04-23 DIAGNOSIS — I10 Essential (primary) hypertension: Secondary | ICD-10-CM | POA: Diagnosis not present

## 2014-04-23 DIAGNOSIS — Z418 Encounter for other procedures for purposes other than remedying health state: Secondary | ICD-10-CM

## 2014-04-23 DIAGNOSIS — L989 Disorder of the skin and subcutaneous tissue, unspecified: Secondary | ICD-10-CM | POA: Diagnosis not present

## 2014-04-23 DIAGNOSIS — Z299 Encounter for prophylactic measures, unspecified: Secondary | ICD-10-CM

## 2014-04-23 DIAGNOSIS — B182 Chronic viral hepatitis C: Secondary | ICD-10-CM

## 2014-04-23 DIAGNOSIS — Z720 Tobacco use: Secondary | ICD-10-CM | POA: Diagnosis not present

## 2014-04-23 DIAGNOSIS — F17210 Nicotine dependence, cigarettes, uncomplicated: Secondary | ICD-10-CM

## 2014-04-23 MED ORDER — AMLODIPINE BESYLATE 5 MG PO TABS: 5.0000 mg | ORAL_TABLET | Freq: Every day | ORAL | Status: DC

## 2014-04-23 MED ORDER — HEPATITIS B VAC RECOMBINANT 5 MCG/0.5ML IJ SUSP: 0.5000 mL | Freq: Once | INTRAMUSCULAR | Status: DC

## 2014-04-23 NOTE — Patient Instructions (Addendum)
General Instructions: I will request for records from the hepatitis C clinic  Please keep your appointment with them  Please take your medications  I have referred you to the hand surgery to look at the nodule on you finger and advise You next dose of hepatitis B vaccine is in March 2016 Please bring your medicines with you each time you come.  Medicines may be  Eye drops  Herbal   Vitamins  Pills  Seeing these help [place] take care of you.   Treatment Goals:  Goals (1 Years of Data) as of 04/23/14         As of Today As of Today 02/03/14 01/27/14 05/22/13     Blood Pressure    . Blood Pressure < 140/90  140/80 146/96 134/91 155/99 122/79      Progress Toward Treatment Goals:  Treatment Goal 04/23/2014  Blood pressure unchanged  Stop smoking smoking the same amount    Self Care Goals & Plans:  Self Care Goal 04/23/2014  Manage my medications take my medicines as prescribed; bring my medications to every visit; refill my medications on time; follow the sick day instructions if I am sick  Monitor my health -  Eat healthy foods -  Be physically active -  Stop smoking -    No flowsheet data found.   Care Management & Community Referrals:  Referral 04/23/2014  Referrals made to community resources smoking cessation

## 2014-04-23 NOTE — Assessment & Plan Note (Signed)
Given flu shot  today 

## 2014-04-23 NOTE — Assessment & Plan Note (Addendum)
BP Readings from Last 3 Encounters:  04/23/14 140/80  02/03/14 134/91  01/27/14 155/99    Lab Results  Component Value Date   NA 138 05/18/2013   K 3.8 05/18/2013   CREATININE 1.19 05/18/2013    Assessment: Blood pressure control: mildly elevated Progress toward BP goal:  at goal Comments: has not been taking amlodipine for "a minute"  Plan: Medications: Cont with Amlodipine 5 mg daily with Zestoretic 20-25 mg daily Educational resources provided:   Self management tools provided:   Other plans: encouraged him to refill his medications on time.

## 2014-04-23 NOTE — Progress Notes (Signed)
INTERNAL MEDICINE TEACHING ATTENDING ADDENDUM - Kyel Purk, MD: I reviewed and discussed at the time of visit with the resident Dr. Kazibwe, the patient's medical history, physical examination, diagnosis and results of pertinent tests and treatment and I agree with the patient's care as documented.  

## 2014-04-23 NOTE — Progress Notes (Signed)
Patient ID: Jonathan [name], male   DOB: November 15, 1955, 58 y.o.   MRN: [id number]   Subjective:   HPI: Jonathan [name] is a 58 y.o. gentleman with past medical history of chronic bilateral foot pain, hypertension, hepatitis C, and polysubstance abuse, presents to the clinic for a f/u appt.   Patient has missed several appointments with the hepatitis C clinic. He presents today for his second dose of hepatitis B vaccination.  His main complaint today is swelling on his left second finger which has been present for several years, but it is bothersome for him and he wants someone to take a look at it. No history of trauma with this lesion. It has been progressively getting bigger, but it does not restrict the movements of his finger. It's not painful.  He tells me that she has not been taking his amlodipine for several weeks after he ran out of his refills.  ROS: Constitutional: Denies fever, chills, diaphoresis, appetite change and fatigue.  Respiratory: Denies SOB, DOE, cough, chest tightness, and wheezing. Denies chest pain. CVS: No chest pain, palpitations and leg swelling.  GI: No abdominal pain, nausea, vomiting, bloody stools GU: No dysuria, frequency, hematuria, or flank pain.  MSK: Patient reports recurrent pains in his legs, which he attributes to his history of polio. He acquired a new braces for his legs, which has been using on and off. No joint swelling or arthralgias  Psych: No depression symptoms. No SI or SA.   Past Medical History  Diagnosis Date  . ERECTILE DYSFUNCTION 06/05/2006    Qualifier: Diagnosis of  By: Grandville Silos MD, Quillian Quince    . Hypertension    Current Outpatient Prescriptions  Medication Sig Dispense Refill  . hydrocortisone cream 1 % Apply to affected area 2 times daily  30 g  1  . lisinopril-hydrochlorothiazide (PRINZIDE,ZESTORETIC) 20-25 MG per tablet Take 1 tablet by mouth daily.  60 tablet  3  . amLODipine (NORVASC) 5 MG tablet Take 1 tablet (5 mg  total) by mouth daily.  60 tablet  2   No current facility-administered medications for this visit.   Family History  Problem Relation Age of Onset  . Cancer Mother    History   Social History  . Marital Status: Single    Spouse Name: N/A    Number of Children: N/A  . Years of Education: N/A   Social History Main Topics  . Smoking status: Current Every Day Smoker -- 0.30 packs/day    Types: Cigarettes  . Smokeless tobacco: None  . Alcohol Use: Yes     Comment: social  . Drug Use: Yes     Comment: marijuana  . Sexual Activity: None   Other Topics Concern  . None   Social History Narrative  . None    Objective:  Physical Exam: Filed Vitals:   04/23/14 1008  BP: 146/96  Pulse: 66  Temp: 97.7 F (36.5 C)  TempSrc: Oral  Height: 6\' 1"  (1.854 m)  Weight: 163 lb 9.6 oz (74.208 kg)  SpO2: 100%   General: Well nourished. No acute distress.  HEENT: Normal oral mucosa. MMM.  Lungs: CTA bilaterally. Heart: RRR; no extra sounds or murmurs  Abdomen: Non-distended, normal BS, soft, nontender; no hepatosplenomegaly  Extremities: No pedal edema. No joint swelling or tenderness.  Left second finger: There is a subcutaneous nodule measuring about 0.5 cm in diameter over the medial aspect proximal interpharyngeal joint. It feels subcutaneous. It's freely mobile. Nontender, and it fills firm  on palpation. Lower extremities: Chronic deformities of feet bilaterally with corns and callus formations. Uses a brace on left side and uses a cane as well. Pulse presents and normal sensation. There is a maculopapular rash in the left popliteal fossa. No erythema, but skin looks dry. It appears better compared to his last visit.  Neurologic: Alert and oriented x3. No obvious neurologic deficits. Antalgic gait, using a walking cane.  Assessment & Plan:  I have discussed my assessment and plan  with  my attending in the clinic, Dr. Dareen Piano as detailed under problem based charting.

## 2014-04-23 NOTE — Assessment & Plan Note (Signed)
Not ready to quit but he would like to. Will cont to counsel about cessation

## 2014-04-23 NOTE — Assessment & Plan Note (Signed)
Assessment: Most likely diagnosis: Ganglion. No complication at this time. Ddx: Lipoma or tendon cyst. I do not suspect cancer.    Plan: 1. Labs/imaging: none 2. Therapy: referral to hand surgery

## 2014-04-23 NOTE — Assessment & Plan Note (Addendum)
Has missed some appointment with the Hep C clinic. He has rescheduled it to 05/24/2014.  Plan  - encouraged him to follow up  - given a second dose of hep B vaccine  - will get records from his hep c clinic

## 2014-06-09 NOTE — Addendum Note (Signed)
Addended by: Hulan Fray on: 06/09/2014 06:09 PM   Modules accepted: Orders

## 2014-08-25 ENCOUNTER — Encounter: Payer: Medicare Other | Admitting: Internal Medicine

## 2014-09-16 ENCOUNTER — Telehealth: Payer: Self-pay | Admitting: Internal Medicine

## 2014-09-16 NOTE — Telephone Encounter (Signed)
Call to patient to confirm appointment for 09/17/14 at 9:15 lmtcb

## 2014-09-17 ENCOUNTER — Ambulatory Visit: Payer: Medicare Other | Admitting: Internal Medicine

## 2014-09-17 ENCOUNTER — Encounter: Payer: Self-pay | Admitting: Internal Medicine

## 2014-10-22 ENCOUNTER — Other Ambulatory Visit: Payer: Self-pay | Admitting: Internal Medicine

## 2014-11-19 ENCOUNTER — Encounter: Payer: Self-pay | Admitting: Internal Medicine

## 2014-11-19 ENCOUNTER — Ambulatory Visit (INDEPENDENT_AMBULATORY_CARE_PROVIDER_SITE_OTHER): Payer: Medicare HMO | Admitting: Internal Medicine

## 2014-11-19 VITALS — BP 132/76 | HR 81 | Temp 98.0°F | Ht 73.0 in | Wt 164.9 lb

## 2014-11-19 DIAGNOSIS — Z7251 High risk heterosexual behavior: Secondary | ICD-10-CM | POA: Diagnosis not present

## 2014-11-19 DIAGNOSIS — I10 Essential (primary) hypertension: Secondary | ICD-10-CM | POA: Diagnosis not present

## 2014-11-19 DIAGNOSIS — G14 Postpolio syndrome: Secondary | ICD-10-CM | POA: Diagnosis not present

## 2014-11-19 DIAGNOSIS — L989 Disorder of the skin and subcutaneous tissue, unspecified: Secondary | ICD-10-CM

## 2014-11-19 DIAGNOSIS — B182 Chronic viral hepatitis C: Secondary | ICD-10-CM | POA: Diagnosis not present

## 2014-11-19 DIAGNOSIS — B91 Sequelae of poliomyelitis: Secondary | ICD-10-CM

## 2014-11-19 DIAGNOSIS — Z299 Encounter for prophylactic measures, unspecified: Secondary | ICD-10-CM

## 2014-11-19 NOTE — Assessment & Plan Note (Signed)
See details in overview. He has missed appointments with hep c clinic several times.  Plan. -will have 3rd dose of hep B vaccine administered. He has completed two of them already.

## 2014-11-19 NOTE — Assessment & Plan Note (Signed)
Assessment: Most likely diagnosis: Ganglion. No complication at this time. Ddx: Lipoma or tendon cyst. I do not suspect cancer.    Plan: 1. Labs/imaging: none needed at this time.  2. Therapy: patient requests a referral to hand surgery. Will proceed.

## 2014-11-19 NOTE — Assessment & Plan Note (Signed)
Patient currently with sexually active with two male sexual partners. He does not use condoms (counseled him about this). He requests for an HIV test. Last one was on 2009 and it was negative.

## 2014-11-19 NOTE — Assessment & Plan Note (Signed)
BP Readings from Last 3 Encounters:  11/19/14 132/76  04/23/14 140/80  02/03/14 134/91    Lab Results  Component Value Date   NA 138 05/18/2013   K 3.8 05/18/2013   CREATININE 1.19 05/18/2013    Assessment: Blood pressure control: controlled Progress toward BP goal:  at goal Comments: Patient does not take Amlodipine.   Plan: Medications: discontinue Amlodipine 5 mg daily since he is not taking it and his BP is at goal. Continue  with Zestoretic 20-25 mg daily Educational resources provided: brochure, handout, video Self management tools provided:   Other plans: yearly follow up

## 2014-11-19 NOTE — Progress Notes (Signed)
Patient ID: Jonathan [name], male   DOB: Oct 08, 1955, 59 y.o.   MRN: [id number]   Subjective:   HPI: [name] is a 59 y.o. gentleman with past medical history of chronic bilateral foot pain, hypertension, hepatitis C, and polysubstance abuse, presents to the clinic for a f/u appt.   Patient has missed several appointments with the hepatitis C clinic. He will need to complete a 3rd dose of his hep B vaccine  He still complains of a swelling on his left second finger which has been present for several years, but it is bothersome for him and he thinks that it has probably increased in size some. He wants someone to take a look at it. No history of trauma with this lesion. It does not restrict the movements of his finger. It's not painful.  He tells me that he is not taking his Amlodipine.   No other complaints today.  Kindly see the A&P for the status of the pt's chronic medical problems.    Past Medical History  Diagnosis Date  . ERECTILE DYSFUNCTION 06/05/2006    Qualifier: Diagnosis of  By: Grandville Silos MD, Quillian Quince    . Hypertension   . POST-POLIO SYNDROME 06/05/2006    Hx of polio at age 59 Has spasms in his legs on and off Used Flexeril on and off for spasms 2015 May >> sent to rehab/PT. rec new leg braces/ortho    . Essential hypertension 06/05/2006    Dx in 2008  Has BP machine at home  Checks erratically  Chronic no show issues      . Chronic viral hepatitis C 06/05/2006    History of blood transfusion in childhood Dx 2010 Reports history of Rx while in [place], unknown yr Elevated LFTs since 2010 and repeat 2014 U/S 2009 - no cirrhosis, repeat in 12/2013 >> normal.  Fibrosure >> pending results  Referred to Hep C clinic 04/23/2013 >> appt in 06/2013 Hep C clinic [phone] Immunity to hep A per labs at Hep C clinic  Started on Hep B series 1 st dose 02/03/2014, 2 dose given 04/23/2014. Last dose about 09/2014.  Follow with Hep C clinic       . Polysubstance abuse 04/23/2013   Cocaine (sniffs powder) and marijuana No IVDU   . Chronic Bil Foot Pain 05/27/2011    Lt > Rt - since age 59 2/2 to post polio syndrome  Corns and callosities on both feet  Reconstruction surgery at age 59. Uses a brace on left leg for many years On prn tylenol for pain  No candidate for opiates due to active substance abuse Podiatry and PT (for brace eval) on 04/23/2013     ROS: Constitutional: Denies fever, chills, diaphoresis, appetite change and fatigue.  Respiratory: Denies SOB, DOE, cough, chest tightness, and wheezing. Denies chest pain. CVS: No chest pain, palpitations and leg swelling.  GI: No abdominal pain, nausea, vomiting, bloody stools GU: No dysuria, frequency, hematuria, or flank pain.  MSK: Patient reports recurrent pains in his legs, which he attributes to his history of polio. He uses braces for his legs, which has been using on and off. No joint swelling or arthralgias  Psych: No depression symptoms. No SI or SA.      Objective:  Physical Exam: Filed Vitals:   11/19/14 1040  BP: 132/76  Pulse: 81  Temp: 98 F (36.7 C)  TempSrc: Oral  Height: 6\' 1"  (1.854 m)  Weight: 164 lb 14.4 oz (74.798 kg)  SpO2: 100%   General: Well nourished. No acute distress.  HEENT: Normal oral mucosa. MMM.  Lungs: CTA bilaterally. Heart: RRR; no extra sounds or murmurs  Abdomen: Non-distended, normal BS, soft, nontender; no hepatosplenomegaly  Extremities: No pedal edema. No joint swelling or tenderness.  Left second finger: There is a subcutaneous nodule measuring about 0.5 cm in diameter over the medial aspect proximal interpharyngeal joint. It feels subcutaneous. It's freely mobile. Nontender, and it fills firm on palpation. It has not appear to have increased in size since his last visit. Lower extremities: Chronic deformities of feet bilaterally with corns and callus formations. Uses a brace on left side and uses a cane as well. Pulse presents and normal sensation. There is a  maculopapular rash in the left popliteal fossa. No erythema, but skin looks dry. It appears better compared to his last visit.  Neurologic: Alert and oriented x3. No obvious neurologic deficits. Antalgic gait, using a walking cane.  Assessment & Plan:  I have discussed my assessment with Dr Lynnae January.  See problem based charting.

## 2014-11-19 NOTE — Progress Notes (Signed)
Internal Medicine Clinic Attending  Case discussed with Dr. Kazibwe soon after the resident saw the patient.  We reviewed the resident's history and exam and pertinent patient test results.  I agree with the assessment, diagnosis, and plan of care documented in the resident's note. 

## 2014-11-19 NOTE — Patient Instructions (Addendum)
General Instructions: Please stop taking Amlodipine  Please continue with your other medication for Blood Pressure I will refer your to physical therapy and hand surgery for your finger Please follow up in one year  Please bring your medicines with you each time you come to clinic.  Medicines may include prescription medications, over-the-counter medications, herbal remedies, eye drops, vitamins, or other pills.   Progress Toward Treatment Goals:  Treatment Goal 11/19/2014  Blood pressure at goal  Stop smoking smoking the same amount    Self Care Goals & Plans:  Self Care Goal 11/19/2014  Manage my medications take my medicines as prescribed; bring my medications to every visit; refill my medications on time; follow the sick day instructions if I am sick  Monitor my health keep track of my blood pressure  Eat healthy foods eat more vegetables; eat fruit for snacks and desserts; eat smaller portions  Be physically active find an activity I enjoy  Stop smoking -    No flowsheet data found.   Care Management & Community Referrals:  Referral 04/23/2014  Referrals made to community resources smoking cessation

## 2014-11-19 NOTE — Addendum Note (Signed)
Addended by: Jessee Avers on: 11/19/2014 01:15 PM   Modules accepted: Orders

## 2014-11-19 NOTE — Assessment & Plan Note (Signed)
He was evaluated by physical therapy in the past. Still uses braces for his legs. He feels that a lot of his pain will not change. I have made a referral to specialist PT clinic for post polio patients where he can get some specialized help.

## 2014-11-20 LAB — HIV ANTIBODY (ROUTINE TESTING W REFLEX): HIV: NONREACTIVE

## 2014-11-26 ENCOUNTER — Ambulatory Visit: Payer: Medicare HMO

## 2014-12-07 ENCOUNTER — Encounter: Payer: Self-pay | Admitting: *Deleted

## 2014-12-16 ENCOUNTER — Telehealth: Payer: Self-pay | Admitting: *Deleted

## 2014-12-16 NOTE — Telephone Encounter (Signed)
SPOKE WITH PATIENT, HE'S GOING TO CALL OFFICE. STATES THIS MIGHT BE  A MISTAKE. HIS SON IS A "JR."  PATIENT WILL FOLLOW UP WITH [place] AS TO THE OUTCOME / [place] [facility] OFFICE.

## 2015-01-12 ENCOUNTER — Ambulatory Visit: Payer: Medicare HMO | Attending: Internal Medicine | Admitting: Physical Therapy

## 2015-01-21 ENCOUNTER — Ambulatory Visit: Payer: Medicare HMO | Attending: Internal Medicine | Admitting: Physical Therapy

## 2015-02-28 ENCOUNTER — Ambulatory Visit: Payer: Medicare HMO | Admitting: Physical Therapy

## 2015-03-02 ENCOUNTER — Ambulatory Visit: Payer: Medicare HMO | Attending: Internal Medicine | Admitting: Physical Therapy

## 2015-03-02 ENCOUNTER — Encounter: Payer: Self-pay | Admitting: Physical Therapy

## 2015-03-02 DIAGNOSIS — R2689 Other abnormalities of gait and mobility: Secondary | ICD-10-CM

## 2015-03-02 DIAGNOSIS — G14 Postpolio syndrome: Secondary | ICD-10-CM | POA: Diagnosis present

## 2015-03-02 DIAGNOSIS — R29818 Other symptoms and signs involving the nervous system: Secondary | ICD-10-CM | POA: Insufficient documentation

## 2015-03-02 DIAGNOSIS — R269 Unspecified abnormalities of gait and mobility: Secondary | ICD-10-CM | POA: Insufficient documentation

## 2015-03-02 DIAGNOSIS — M25676 Stiffness of unspecified foot, not elsewhere classified: Secondary | ICD-10-CM | POA: Insufficient documentation

## 2015-03-02 DIAGNOSIS — M25579 Pain in unspecified ankle and joints of unspecified foot: Secondary | ICD-10-CM | POA: Diagnosis present

## 2015-03-02 DIAGNOSIS — B91 Sequelae of poliomyelitis: Secondary | ICD-10-CM

## 2015-03-02 DIAGNOSIS — M21969 Unspecified acquired deformity of unspecified lower leg: Secondary | ICD-10-CM | POA: Diagnosis present

## 2015-03-02 DIAGNOSIS — M6281 Muscle weakness (generalized): Secondary | ICD-10-CM

## 2015-03-02 DIAGNOSIS — R2681 Unsteadiness on feet: Secondary | ICD-10-CM | POA: Diagnosis present

## 2015-03-03 NOTE — Therapy (Signed)
[facility] [address] [place], [place], [address] Phone: [phone]   Fax:  [phone]  Physical Therapy Evaluation  Patient Details  Name: Jonathan [name] MRN: [id number] Date of Birth: 1955-08-03 Referring Provider:  Jessee Avers, MD  Encounter Date: 03/02/2015      PT End of Session - 03/03/15 1514    Visit Number 1   Number of Visits 5   Date for PT Re-Evaluation 04/08/15   PT Start Time 1230   PT Stop Time 1315   PT Time Calculation (min) 45 min   Equipment Utilized During Treatment Gait belt   Activity Tolerance Patient tolerated treatment well   Behavior During Therapy [facility] for tasks assessed/performed      Past Medical History  Diagnosis Date  . ERECTILE DYSFUNCTION 06/05/2006    Qualifier: Diagnosis of  By: Grandville Silos MD, Quillian Quince    . Hypertension   . POST-POLIO SYNDROME 06/05/2006    Hx of polio at age 43 Has spasms in his legs on and off Used Flexeril on and off for spasms 2015 May >> sent to rehab/PT. rec new leg braces/ortho    . Essential hypertension 06/05/2006    Dx in 2008  Has BP machine at home  Checks erratically  Chronic no show issues      . Chronic viral hepatitis C 06/05/2006    History of blood transfusion in childhood Dx 2010 Reports history of Rx while in [place], unknown yr Elevated LFTs since 2010 and repeat 2014 U/S 2009 - no cirrhosis, repeat in 12/2013 >> normal.  Fibrosure >> pending results  Referred to Hep C clinic 04/23/2013 >> appt in 06/2013 Hep C clinic [phone] Immunity to hep A per labs at Hep C clinic  Started on Hep B series 1 st dose 02/03/2014, 2 dose given 04/23/2014. Last dose about 09/2014.  Follow with Hep C clinic       . Polysubstance abuse 04/23/2013    Cocaine (sniffs powder) and marijuana No IVDU   . Chronic Bil Foot Pain 05/27/2011    Lt > Rt - since age 89 2/2 to post polio syndrome  Corns and callosities on both feet  Reconstruction surgery at age 73. Uses a  brace on left leg for many years On prn tylenol for pain  No candidate for opiates due to active substance abuse Podiatry and PT (for brace eval) on 04/23/2013      Past Surgical History  Procedure Laterality Date  . Knee arthroscopy Right   . Fracture surgery      There were no vitals filed for this visit.  Visit Diagnosis:  Decreased range of motion of foot, unspecified laterality  Deformity of ankle and foot, acquired, unspecified laterality  Post-polio muscle weakness  Pain in joint, ankle and foot, unspecified laterality  Abnormality of gait  Unsteadiness  Balance problems  Post-polio syndrome      Subjective Assessment - 03/02/15 1238    Subjective This 59yo male has history of post-polio syndrome for 5-10 yrs ago. Polio at birth. He reports increased foot pain & weakness, increased difficulty walking & balance.   Patient Stated Goals He wants to reduce pain & weakness to return to active lifestyle.   Currently in Pain? Yes   Pain Score 9   in last week worst 10/10, best 8/10   Pain Location Leg  feet > thighs   Pain Orientation Anterior   Pain Descriptors / Indicators Aching   Pain Type Chronic  pain   Pain Onset More than a month ago   Pain Frequency Constant   Aggravating Factors  walking   Pain Relieving Factors sit down, elevating   Effect of Pain on Daily Activities limits walking            [facility] PT Assessment - 03/02/15 1230    Assessment   Medical Diagnosis post-polio syndrome   Precautions   Precautions Fall   Restrictions   Weight Bearing Restrictions No   Balance Screen   Has the patient fallen in the past 6 months Yes   How many times? 1   Has the patient had a decrease in activity level because of a fear of falling?  Yes   Is the patient reluctant to leave their home because of a fear of falling?  No   Home Environment   Living Environment Private residence   Living Arrangements Children   Type of [place]  entrance;Stairs to enter   Entrance Stairs-Number of Steps 5   Entrance Stairs-Rails Right;Left;Can reach both   [place] One level   [place] --  plastic AFO >5 yrs ago   Prior Function   Level of Independence Independent;Independent with household mobility without device;Independent with community mobility without device;Independent with gait   Vocation On disability   Observation/Other Assessments   Skin Integrity left foot: 3 callous areas- lateral great toe IP, 5th toe lateral MTP and dorsum IP, toenails fungal dark hypertrophied, decreased moisture to foot & signs of decreased circulation, Hallux Valgus & 5th toe adduction;   Right foot: same as left foot except on 2 callouses 1 at lateral great toe & lateral 5th toe   Hammer toe deformities of all toes w/ dorsum pressure areas   Focus on Therapeutic Outcomes (FOTO)  54  Risk Adjusted 47   Fear Avoidance Belief Questionnaire (FABQ)  19%   Transfers   Transfers Sit to Stand;Stand to Sit   Sit to Stand 5: Supervision;Without upper extremity assist;From chair/3-in-1  uses unsafe technique using back of legs against mat table   Stand to Sit 5: Supervision;With upper extremity assist;To chair/3-in-1;Uncontrolled descent   Ambulation/Gait   Ambulation/Gait Yes   Ambulation/Gait Assistance 5: Supervision   Ambulation/Gait Assistance Details patient reports AFO (old) causes pain at calloused areas   Ambulation Distance (Feet) 100 Feet   Assistive device None   Gait Pattern Wide base of support;Lateral trunk lean to left;Lateral trunk lean to right;Lateral hip instability;Poor foot clearance - left;Left genu recurvatum;Left steppage;Decreased dorsiflexion - left;Decreased stance time - left;Decreased step length - right;Step-through pattern   Ambulation Surface Indoor;Level   Gait velocity 2.85 ft/sec without AFO  2.77 fts/ec with AFO   Gait Comments Significant foot drop, trailed blue rocker AFO but pain from calloused areas  causes antalegic gait but improved clearance with less deviations causing higher energy expenditure, fall risk and patient reports he could feel dynamic response from AFO design   Standardized Balance Assessment   Standardized Balance Assessment Berg Balance Test;Timed Up and Go Test   Berg Balance Test   Sit to Stand Able to stand  independently using hands  able without UE but unsafe   Standing Unsupported Able to stand safely 2 minutes   Sitting with Back Unsupported but Feet Supported on Floor or Stool Able to sit safely and securely 2 minutes   Stand to Sit Controls descent by using hands   Transfers Able to transfer safely, minor use of  hands   Standing Unsupported with Eyes Closed Able to stand 10 seconds with supervision   Standing Ubsupported with Feet Together Able to place feet together independently but unable to hold for 30 seconds   From Standing, Reach Forward with Outstretched Arm Can reach forward >12 cm safely (5")   From Standing Position, Pick up Object from [place] to pick up shoe, needs supervision   From Standing Position, Turn to Look Behind Over each Shoulder Looks behind one side only/other side shows less weight shift   Turn 360 Degrees Needs close supervision or verbal cueing   Standing Unsupported, Alternately Place Feet on Step/Stool Able to complete 4 steps without aid or supervision   Standing Unsupported, One Foot in [place] to take small step independently and hold 30 seconds   Standing on One Leg Tries to lift leg/unable to hold 3 seconds but remains standing independently   Total Score 38   Timed Up and Go Test   Normal TUG (seconds) 10.65  10.65sec without AFO, 11.14sec with AFO                                PT Long Term Goals - 03/02/15 1230    PT LONG TERM GOAL #1   Title Patient verbalizes understanding of Post-Polio Syndrome & modifications to activity /fall prevention strategies. (Target Date: 04/08/2015)   Time 4    Period Weeks   Status New   PT LONG TERM GOAL #2   Title patient demonstrates / verbalizes understanding of fitness program / HEP.  (Target Date: 04/08/2015)   Time 4   Period Weeks   Status New   PT LONG TERM GOAL #3   Title Patient verbalizes understanding of shoe & orthotic recommendations.  (Target Date: 04/08/2015)   Time 4   Period Weeks   Status New               Plan - 03/02/15 1230    Clinical Impression Statement This 59yo male had polio as an infant. He was diagnosed with Post Polio Syndrome but has not been educated on the disease or issues associated with how it effects his function along with necessary adjustments. He has weakness associated with polio that has increased with Post Polio Syndrome. He needs an AFO due significant foot drop impeding gait & safety. However he has callouses on both feet that are painful significantly worse with AFO use. He has an old Thermoplastic custom AFO (did not bring to eval so unable to assess) that he reports hurts with any standing or gait. PT assessed gait with dynamic response AFO that improved gait deviations but caused pain / antalegic pattern. He has foot deformities from weakness & decreased circulation including hammer toes & bunion deformities at great toe & little toe. He needs extra depth shoes to accomodate Hammer Toe Deformity, custom foot orthotics to accomodate callouses & foot deformities and AFO that ideally is dynamic response to assist with energy expenditure but foremost needs to be painfree with use. He would benefit from PT for instruction in fitness program including strength, flexiblity, balance & endurance.                                   Pt will benefit from skilled therapeutic intervention in order to improve on the following deficits Abnormal gait;Decreased activity tolerance;Decreased balance;Decreased  knowledge of use of DME;Decreased mobility;Decreased range of motion;Decreased strength;Pain;Postural dysfunction    Rehab Potential Good   Clinical Impairments Affecting Rehab Potential vascular changes to feet along with deformities from weakness. He is not elgible for diabetic shoes with inserts covered by insurance but needs them to accommodate deformities   PT Frequency 1x / week   PT Duration 4 weeks   PT Treatment/Interventions ADLs/Self Care Home Management;DME Instruction;Gait training;Stair training;Functional mobility training;Therapeutic activities;Therapeutic exercise;Balance training;Neuromuscular re-education;Patient/family education;Orthotic Fit/Training   PT Next Visit Plan HEP including strength, flexibility & balance, Instruct in Post-poilio condition   Recommended Other Services extra depth shoes with custom inserts, AFO   Consulted and Agree with Plan of Care Patient          G-Codes - 09-Mar-2015 1230    Functional Assessment Tool Used Berg Balance 38/56, Unsafe gait with foot drop, pain with AFO use   Functional Limitation Mobility: Walking and moving around   Mobility: Walking and Moving Around Current Status [phone]) At least 40 percent but less than 60 percent impaired, limited or restricted   Mobility: Walking and Moving Around Goal Status [phone]) At least 20 percent but less than 40 percent impaired, limited or restricted       Problem List Patient Active Problem List   Diagnosis Date Noted  . Lesion of finger 04/23/2014  . Polysubstance abuse 04/23/2013  . Preventive measure 05/27/2011  . Chronic Bil Foot Pain 05/27/2011  . Elevated Liver Enzymes 10/13/2008  . Smoking 07/26/2008  . Chronic viral hepatitis C 06/05/2006  . POST-POLIO SYNDROME 06/05/2006  . Essential hypertension 06/05/2006    Jamey Reas PT, DPT 03/03/2015, 4:49 PM  [facility] [address] [place], [place], [address] Phone: [phone]   Fax:  [phone]

## 2015-06-13 ENCOUNTER — Encounter: Payer: Self-pay | Admitting: Physical Therapy

## 2015-06-13 NOTE — Therapy (Signed)
[facility] [address] [place], [place], [address] Phone: [phone]   Fax:  [phone]  Patient Details  Name: Jonathan [name] MRN: [id number] Date of Birth: 04/22/1956 Referring Provider:  No ref. provider found  Encounter Date: 06/13/2015   PHYSICAL THERAPY DISCHARGE SUMMARY  Visits from Start of Care: 1  Current functional level related to goals / functional outcomes: Unknown he has not returned since evaluation.   Remaining deficits: Unknown   Education / Equipment: none  Plan: Patient agrees to discharge.  Patient goals were not met. Patient is being discharged due to not returning since the last visit.  ?????       Darnetta Kesselman PT, DPT 06/13/2015, 9:17 AM  [facility] [address] [place], [place], [address] Phone: [phone]   Fax:  [phone]

## 2015-11-08 ENCOUNTER — Encounter: Payer: Self-pay | Admitting: Gastroenterology

## 2015-11-21 ENCOUNTER — Other Ambulatory Visit: Payer: Self-pay | Admitting: Internal Medicine

## 2015-11-21 NOTE — Telephone Encounter (Signed)
Last visit 11/16/2014. Multiple no show appointments, none currently scheduled. Will send to front office to make appointment.

## 2015-11-23 NOTE — Telephone Encounter (Signed)
Spoke with patient this am.  He agreed to an appt for 12-09-15 @ 10:15 am with Dr. Gordy Levan.

## 2015-12-09 ENCOUNTER — Ambulatory Visit: Payer: Medicare HMO | Admitting: Internal Medicine

## 2015-12-16 ENCOUNTER — Ambulatory Visit (INDEPENDENT_AMBULATORY_CARE_PROVIDER_SITE_OTHER): Payer: Medicare HMO | Admitting: Internal Medicine

## 2015-12-16 VITALS — BP 137/95 | HR 85 | Temp 97.9°F | Ht 73.0 in | Wt 158.7 lb

## 2015-12-16 DIAGNOSIS — M4800 Spinal stenosis, site unspecified: Secondary | ICD-10-CM | POA: Insufficient documentation

## 2015-12-16 DIAGNOSIS — M5416 Radiculopathy, lumbar region: Secondary | ICD-10-CM | POA: Diagnosis not present

## 2015-12-16 DIAGNOSIS — B182 Chronic viral hepatitis C: Secondary | ICD-10-CM | POA: Diagnosis not present

## 2015-12-16 DIAGNOSIS — M25551 Pain in right hip: Secondary | ICD-10-CM | POA: Diagnosis not present

## 2015-12-16 DIAGNOSIS — M48061 Spinal stenosis, lumbar region without neurogenic claudication: Secondary | ICD-10-CM | POA: Insufficient documentation

## 2015-12-16 DIAGNOSIS — I10 Essential (primary) hypertension: Secondary | ICD-10-CM

## 2015-12-16 DIAGNOSIS — Z299 Encounter for prophylactic measures, unspecified: Secondary | ICD-10-CM

## 2015-12-16 MED ORDER — GABAPENTIN 300 MG PO CAPS: 300.0000 mg | ORAL_CAPSULE | Freq: Every day | ORAL | Status: DC

## 2015-12-16 MED ORDER — MELOXICAM 15 MG PO TABS: 15.0000 mg | ORAL_TABLET | Freq: Every day | ORAL | Status: DC

## 2015-12-16 MED ORDER — LISINOPRIL-HYDROCHLOROTHIAZIDE 20-25 MG PO TABS: 1.0000 | ORAL_TABLET | Freq: Every day | ORAL | Status: DC

## 2015-12-16 NOTE — Patient Instructions (Signed)
1. Please follow up in 4-6 weeks.   I have placed referral for PT and infectious disease for your hip and back pain and Chronic Hep C, respectively.   2. Please take all medications as previously prescribed with the following changes:  Continue to take your Lisinopril-HCTZ. I have sent a refill to your pharmacy.   Please start taking Neurontin 300 mg at bedtime for hip and back pain.   Please use Mobic 15 mg once daily, ONLY WHEN NEEDED for hip and back pain.   3. If you have worsening of your symptoms or new symptoms arise, please call the clinic [phone]), or go to the ER immediately if symptoms are severe.

## 2015-12-16 NOTE — Progress Notes (Signed)
Subjective:   Patient ID: HOLDAN TIPLER male   DOB: [birth year] 60 y.o.   MRN: [id number]  HPI: Mr. MONTARIUS GRIBBINS is a 60 y.o. male w/ PMHx of Polio, HTN, chronic Hep C, and h/o cocaine abuse, presents to the clinic today for a follow-up visit for HTN, back pain, and chronic Hep C. Patient has not been seen in the clinic for some time, but says he is doing well. His main complaint today is right hip pain and low back pain. He says he has had this for a long time, feels his right hip pain is related to compensation for his left side which has been affected by Polio. His low back pain is generally dull in nature, but also radiates into his thighs bilaterally. No weakness in the LE's, no saddle anesthesia, or incontinence. He has never had imaging before. Has worked with PT in the distant past and says that this had helped.   Patient has not been treated for his Hep C. Attempts were made to refer him to Hep C clinic in the past (2014), but patient never went and this was also prior to new therapies that are curative.   Past Medical History  Diagnosis Date  . ERECTILE DYSFUNCTION 06/05/2006    Qualifier: Diagnosis of  By: Grandville Silos MD, Quillian Quince    . Hypertension   . POST-POLIO SYNDROME 06/05/2006    Hx of polio at age 89 Has spasms in his legs on and off Used Flexeril on and off for spasms 2015 May >> sent to rehab/PT. rec new leg braces/ortho    . Essential hypertension 06/05/2006    Dx in 2008  Has BP machine at home  Checks erratically  Chronic no show issues      . Chronic viral hepatitis C 06/05/2006    History of blood transfusion in childhood Dx 2010 Reports history of Rx while in [place], unknown yr Elevated LFTs since 2010 and repeat 2014 U/S 2009 - no cirrhosis, repeat in 12/2013 >> normal.  Fibrosure >> pending results  Referred to Hep C clinic 04/23/2013 >> appt in 06/2013 Hep C clinic [phone] Immunity to hep A per labs at Hep C clinic  Started on Hep B series 1 st dose 02/03/2014, 2  dose given 04/23/2014. Last dose about 09/2014.  Follow with Hep C clinic       . Polysubstance abuse 04/23/2013    Cocaine (sniffs powder) and marijuana No IVDU   . Chronic Bil Foot Pain 05/27/2011    Lt > Rt - since age 89 2/2 to post polio syndrome  Corns and callosities on both feet  Reconstruction surgery at age 20. Uses a brace on left leg for many years On prn tylenol for pain  No candidate for opiates due to active substance abuse Podiatry and PT (for brace eval) on 04/23/2013     Current Outpatient Prescriptions  Medication Sig Dispense Refill  . lisinopril-hydrochlorothiazide (PRINZIDE,ZESTORETIC) 20-25 MG tablet TAKE ONE TABLET BY MOUTH ONCE DAILY. 60 tablet 0   No current facility-administered medications for this visit.    Review of Systems: General: Denies fever, chills, diaphoresis, appetite change and fatigue.  Respiratory: Denies SOB, DOE, cough, and wheezing.   Cardiovascular: Denies chest pain and palpitations.  Gastrointestinal: Denies nausea, vomiting, abdominal pain, and diarrhea.  Genitourinary: Denies dysuria, increased frequency, and flank pain. Endocrine: Denies hot or cold intolerance, polyuria, and polydipsia. Musculoskeletal: Positive for right hip pain, low back pain, gait problem. Denies myalgias,  joint swelling.  Skin: Denies pallor, rash and wounds.  Neurological: Denies dizziness, seizures, syncope, weakness, lightheadedness, numbness and headaches.  Psychiatric/Behavioral: Denies mood changes, and sleep disturbances.  Objective:   Physical Exam: Filed Vitals:   12/16/15 1347  BP: 137/95  Pulse: 85  Temp: 97.9 F (36.6 C)  TempSrc: Oral  Height: 6\' 1"  (1.854 m)  Weight: 158 lb 11.2 oz (71.986 kg)  SpO2: 100%    General: Thin appearing AA male, alert, cooperative, NAD. Walks with a cane.  HEENT: PERRL, EOMI. Moist mucus membranes Neck: Full range of motion without pain, supple, no lymphadenopathy or carotid bruits Lungs: Clear to ascultation  bilaterally, normal work of respiration, no wheezes, rales, rhonchi Heart: RRR, no murmurs, gallops, or rubs Abdomen: Soft, non-tender, non-distended, BS + Extremities: No cyanosis, clubbing, or edema.  Neurologic: Alert & oriented X3, cranial nerves II-XII intact, strength grossly intact, sensation intact to light touch. ?Antalgic gait, mild foot drop on the left.    Assessment & Plan:   Please see problem based assessment and plan.

## 2015-12-17 LAB — CMP14 + ANION GAP
ALBUMIN: 3.8 g/dL (ref 3.5–5.5)
ALT: 85 IU/L — ABNORMAL HIGH (ref 0–44)
AST: 91 IU/L — ABNORMAL HIGH (ref 0–40)
Albumin/Globulin Ratio: 1.3 (ref 1.2–2.2)
Alkaline Phosphatase: 73 IU/L (ref 39–117)
Anion Gap: 16 mmol/L (ref 10.0–18.0)
BUN / CREAT RATIO: 13 (ref 9–20)
BUN: 16 mg/dL (ref 6–24)
Bilirubin Total: 0.6 mg/dL (ref 0.0–1.2)
CALCIUM: 9.5 mg/dL (ref 8.7–10.2)
CO2: 22 mmol/L (ref 18–29)
Chloride: 107 mmol/L — ABNORMAL HIGH (ref 96–106)
Creatinine, Ser: 1.28 mg/dL — ABNORMAL HIGH (ref 0.76–1.27)
GFR, EST AFRICAN AMERICAN: 71 mL/min/{1.73_m2} (ref >59)
GFR, EST NON AFRICAN AMERICAN: 61 mL/min/{1.73_m2} (ref >59)
GLOBULIN, TOTAL: 3 g/dL (ref 1.5–4.5)
GLUCOSE: 77 mg/dL (ref 65–99)
POTASSIUM: 3.7 mmol/L (ref 3.5–5.2)
Sodium: 145 mmol/L — ABNORMAL HIGH (ref 134–144)
TOTAL PROTEIN: 6.8 g/dL (ref 6.0–8.5)

## 2015-12-17 LAB — HCV RNA QUANT
HCV log10: 5.365 log10 IU/mL
Hepatitis C Quantitation: 232000 IU/mL

## 2015-12-17 LAB — CBC WITH DIFFERENTIAL/PLATELET
BASOS: 1 %
Basophils Absolute: 0 10*3/uL (ref 0.0–0.2)
EOS (ABSOLUTE): 0.1 10*3/uL (ref 0.0–0.4)
Eos: 1 %
HEMOGLOBIN: 13.4 g/dL (ref 12.6–17.7)
Hematocrit: 39.4 % (ref 37.5–51.0)
IMMATURE GRANS (ABS): 0 10*3/uL (ref 0.0–0.1)
Immature Granulocytes: 0 %
LYMPHS: 43 %
Lymphocytes Absolute: 1.8 10*3/uL (ref 0.7–3.1)
MCH: 31.2 pg (ref 26.6–33.0)
MCHC: 34 g/dL (ref 31.5–35.7)
MCV: 92 fL (ref 79–97)
MONOCYTES: 12 %
Monocytes Absolute: 0.5 10*3/uL (ref 0.1–0.9)
NEUTROS ABS: 1.8 10*3/uL (ref 1.4–7.0)
Neutrophils: 43 %
Platelets: 276 10*3/uL (ref 150–379)
RBC: 4.3 x10E6/uL (ref 4.14–5.80)
RDW: 14 % (ref 12.3–15.4)
WBC: 4.1 10*3/uL (ref 3.4–10.8)

## 2015-12-20 NOTE — Assessment & Plan Note (Signed)
Patient with pain in lower back and burning in legs. Also with right hip pain, most likely OA related to compensation for left leg, which is affected by Polio (has had since he was a child). No alarm symptoms.  -Start with PT referral -Neurontin 300 qhs for radiculopathy pain -Mobic 15 mg daily prn for arthritic pain -If no improvement in symptoms, can consider imaging, referral to Sports Medicine/Ortho for injections, etc.

## 2015-12-20 NOTE — Assessment & Plan Note (Signed)
Most likely OA related to over-compensation for left leg, affected by Polio. -Mobic 15 mg prn -Referral to PT or now

## 2015-12-20 NOTE — Assessment & Plan Note (Signed)
BP Readings from Last 3 Encounters:  12/16/15 137/95  11/19/14 132/76  04/23/14 140/80    Lab Results  Component Value Date   NA 145* 12/16/2015   K 3.7 12/16/2015   CREATININE 1.28* 12/16/2015    Assessment: Blood pressure control:  Well controlled Progress toward BP goal:   At goal Comments: Taking Lisinopril-HCTZ 20-25 daily  Plan: Medications:  continue current medications. Check BMP Other plans: RTC in 4-6 weeks

## 2015-12-20 NOTE — Assessment & Plan Note (Signed)
Patient with known chronic Hep C, not treated. Looks like attempts have been made to refer to Hep C clinic, prior to new therapies. Discussed this at length today, patient is very interested.  -Check CMP, CBC, Hep C viral load -Abdominal US with Elastography -Refer to ID

## 2015-12-20 NOTE — Assessment & Plan Note (Signed)
Please see Hep C. Check CBC, CMP today.

## 2015-12-22 NOTE — Progress Notes (Signed)
Internal Medicine Clinic Attending  Case discussed with Dr. Jones at the time of the visit.  We reviewed the resident's history and exam and pertinent patient test results.  I agree with the assessment, diagnosis, and plan of care documented in the resident's note.  

## 2015-12-28 ENCOUNTER — Ambulatory Visit: Payer: Medicare HMO | Attending: Internal Medicine | Admitting: Physical Therapy

## 2015-12-28 DIAGNOSIS — M62830 Muscle spasm of back: Secondary | ICD-10-CM | POA: Diagnosis not present

## 2015-12-28 DIAGNOSIS — M6281 Muscle weakness (generalized): Secondary | ICD-10-CM | POA: Diagnosis not present

## 2015-12-28 DIAGNOSIS — M5441 Lumbago with sciatica, right side: Secondary | ICD-10-CM

## 2015-12-28 DIAGNOSIS — R262 Difficulty in walking, not elsewhere classified: Secondary | ICD-10-CM | POA: Diagnosis not present

## 2015-12-29 NOTE — Therapy (Signed)
[place], [place], [address] Phone: [phone]   Fax:  [phone]  Physical Therapy Evaluation  Patient Details  Name: Jonathan [name] MRN: [id number] Date of Birth: [birth year] Referring Provider: Dr Luanne Bras  Encounter Date: 12/28/2015      PT End of Session - 12/29/15 1202    Visit Number 1   Number of Visits 16   Date for PT Re-Evaluation 02/23/16   Authorization Type [employer]    PT Start Time 1100   PT Stop Time 1145   PT Time Calculation (min) 45 min   Activity Tolerance Patient tolerated treatment well   Behavior During Therapy [facility] for tasks assessed/performed      Past Medical History  Diagnosis Date  . ERECTILE DYSFUNCTION 06/05/2006    Qualifier: Diagnosis of  By: Grandville Silos MD, Quillian Quince    . Hypertension   . POST-POLIO SYNDROME 06/05/2006    Hx of polio at age 60 Has spasms in his legs on and off Used Flexeril on and off for spasms 2015 May >> sent to rehab/PT. rec new leg braces/ortho    . Essential hypertension 06/05/2006    Dx in 2008  Has BP machine at home  Checks erratically  Chronic no show issues      . Chronic viral hepatitis C 06/05/2006    History of blood transfusion in childhood Dx 2010 Reports history of Rx while in [place], unknown yr Elevated LFTs since 2010 and repeat 2014 U/S 2009 - no cirrhosis, repeat in 12/2013 >> normal.  Fibrosure >> pending results  Referred to Hep C clinic 04/23/2013 >> appt in 06/2013 Hep C clinic [phone] Immunity to hep A per labs at Hep C clinic  Started on Hep B series 1 st dose 02/03/2014, 2 dose given 04/23/2014. Last dose about 09/2014.  Follow with Hep C clinic       . Polysubstance abuse 04/23/2013    Cocaine (sniffs powder) and marijuana No IVDU   . Chronic Bil Foot Pain 05/27/2011    Lt > Rt - since age 60 2/2 to post polio syndrome  Corns and callosities on both feet  Reconstruction surgery at age 60. Uses a brace on left leg for many  years On prn tylenol for pain  No candidate for opiates due to active substance abuse Podiatry and PT (for brace eval) on 04/23/2013      Past Surgical History  Procedure Laterality Date  . Knee arthroscopy Right   . Fracture surgery      There were no vitals filed for this visit.       Subjective Assessment - 12/28/15 1110    Subjective Patient presents with a six month hisotry of lower back pain on the right side. The pain goes down the right side and into his right leg. He feels at times like his right leg gives out.    Pertinent History Polio that effected the right foot; Patient stands at work    Limitations Walking;Standing;Sitting   How long can you sit comfortably? No limit    How long can you stand comfortably? > 1 hour    How long can you walk comfortably? limited community distances    Diagnostic tests Nothing recent for the lower back    Patient Stated Goals Less pain in the hip and the back; better knowledge of his back pain   Pain Score 9    Pain Location Back   Pain Descriptors /  Indicators Numbness;Aching   Pain Radiating Towards right leg    Pain Onset More than a month ago   Pain Frequency Intermittent   Multiple Pain Sites No            [facility] PT Assessment - 12/29/15 0001    Assessment   Medical Diagnosis right sided lower back pain   Onset Date/Surgical Date --  6 months prior    Hand Dominance Right   Next MD Visit 4 weeks    Prior Therapy none   Precautions   Precautions None   Restrictions   Weight Bearing Restrictions No   Balance Screen   Has the patient fallen in the past 6 months No   Home Environment   Additional Comments 4 0r 5 steps going into the house; Has a ramp into the house.    Prior Function   Level of Independence Independent with community mobility with device   Vocation Part time employment   Vocation Requirements Standing   Leisure bowling; shoot basketball    Cognition   Overall Cognitive Status Within Functional  Limits for tasks assessed   Observation/Other Assessments   Focus on Therapeutic Outcomes (FOTO)  64%   Sensation   Additional Comments radicular pain into his right lower extremity    AROM   AROM Assessment Site Lumbar   Lumbar Flexion 35   Lumbar Extension 20   Lumbar - Right Rotation WNL but pain    Lumbar - Left Rotation WNL    Strength   Overall Strength Comments Poor quad contraction noted    Right/Left Hip Right;Left   Right Hip Flexion 4/5   Right Hip Extension 4/5   Right Hip ABduction 5/5   Right Hip ADduction 5/5   Left Hip Flexion 4/5   Left Hip ABduction 5/5   Left Hip ADduction 5/5   Right/Left Knee Left;Right   Right Knee Flexion 5/5   Right Knee Extension 4/5   Left Knee Flexion 4/5   Left Knee Extension 5/5   Right/Left Ankle Right;Left   Right Ankle Dorsiflexion 5/5   Left Ankle Dorsiflexion 5/5   Flexibility   Soft Tissue Assessment /Muscle Length --  90/90 hamstring R 35 degrees L 15 degrees    Special Tests    Special Tests --  SLR (-) L and right    Ambulation/Gait   Gait Comments decreased hip rotation; lateral movement with gait; cane in left hand;                    [facility] Adult PT Treatment/Exercise - 12/29/15 0001    Lumbar Exercises: Stretches   Passive Hamstring Stretch Limitations 2x20 sec 90 /90 position    Single Knee to Chest Stretch Limitations 2x20sec stretch    Lower Trunk Rotation Limitations x10   Piriformis Stretch Limitations 2x20sec hold    Lumbar Exercises: Supine   Other Supine Lumbar Exercises Hip external rotation stretch 2x20sec                 PT Education - 12/29/15 1246    Education provided Yes   Education Details Patient given HEP; educated paitient on symptom mangement   Person(s) Educated Patient   Methods Explanation;Demonstration   Comprehension Verbalized understanding;Returned demonstration          PT Short Term Goals - 12/29/15 1219    PT SHORT TERM GOAL #1   Title Patient will  increase ability to perfrom core contraction to good  Baseline poor corecontraction    Time 4   Period Weeks   Status New   PT SHORT TERM GOAL #2   Title Patient will report centralized lower back pain > 4/10    Baseline pain down right leg    Time 4   Period Weeks   Status New   PT SHORT TERM GOAL #3   Title Patient will increase right hip flexion strength to 4+/5    Baseline 4/5    Time 4   Period Weeks   Status New   PT SHORT TERM GOAL #4   Title Patient will be I w/ HEP for core strengthening    Time 4   Period Weeks   Status New           PT Long Term Goals - 01/15/2016 1221    PT LONG TERM GOAL #1   Title Patient will stand at work for 2 hours without increased radicular pain or pain in his lower back.    Time 8   Period Weeks   Status New   PT LONG TERM GOAL #2   Title Patient will ambulate 3000' w/o increased pain in order to go to the store and shop    Time 8   Period Weeks   Status New   PT LONG TERM GOAL #3   Title Patients FOTO score will show a 49% deficit    Time 8   Period Weeks   Status New   PT LONG TERM GOAL #4   Title Patient will put his shoes and socks on without pain    Time 8   Period Weeks   Status New               Plan - 01-15-16 1210    Clinical Impression Statement Patient is a 60 year old male with lower back pain that runs into her right leg. His pain gets worse when he is standing. He has tight hamstrings on he right compared to the left. HIs pain is effecting his ability to work.  He was seen today for a low complexity evaluation    PT Frequency 2x / week   PT Duration 8 weeks   PT Treatment/Interventions ADLs/Self Care Home Management;Cryotherapy;Electrical Stimulation;Moist Heat;Therapeutic exercise;Therapeutic activities;Functional mobility training;Stair training;Gait training;Patient/family education;Balance training;Manual techniques   PT Next Visit Plan review HEP. Give HEP for strengthening. Patient may only come  for a few visits 2nd to a high co-pay.    PT Home Exercise Plan piriformis stretch, single knee to chest stretch, hamstring stretch, lateral trunk rotation, hip external rotation stretch   Consulted and Agree with Plan of Care Patient      Patient will benefit from skilled therapeutic intervention in order to improve the following deficits and impairments:  Abnormal gait, Decreased activity tolerance, Decreased balance, Decreased strength, Decreased mobility, Postural dysfunction, Improper body mechanics, Decreased endurance, Difficulty walking, Pain  Visit Diagnosis: Right-sided low back pain with right-sided sciatica  Muscle spasm of back  Muscle weakness (generalized)  Difficulty in walking, not elsewhere classified      G-Codes - Jan 15, 2016 1228    Functional Limitation Changing and maintaining body position   Changing and Maintaining Body Position Current Status [id number]) At least 40 percent but less than 60 percent impaired, limited or restricted   Changing and Maintaining Body Position Goal Status [id number]) At least 1 percent but less than 20 percent impaired, limited or restricted       Problem List Patient  Active Problem List   Diagnosis Date Noted  . Lumbar radiculopathy 12/16/2015  . Right hip pain 12/16/2015  . Lesion of finger 04/23/2014  . Polysubstance abuse 04/23/2013  . Preventive measure 05/27/2011  . Chronic Bil Foot Pain 05/27/2011  . Elevated Liver Enzymes 10/13/2008  . Smoking 07/26/2008  . Chronic viral hepatitis C ([place]) 06/05/2006  . POST-POLIO SYNDROME 06/05/2006  . Essential hypertension 06/05/2006    Carney Living PT DPT  12/29/2015, 12:50 PM  [facility] [address] [place], [place], [address] Phone: [phone]   Fax:  [phone]  Name: DAUD BLASINGAME MRN: [id number] Date of Birth: 06-25-1956

## 2016-01-11 ENCOUNTER — Encounter: Payer: Medicare HMO | Admitting: Physical Therapy

## 2016-01-18 ENCOUNTER — Ambulatory Visit: Payer: Medicare HMO | Admitting: Physical Therapy

## 2016-01-19 ENCOUNTER — Encounter: Payer: Medicare HMO | Admitting: Internal Medicine

## 2016-01-20 ENCOUNTER — Other Ambulatory Visit: Payer: Medicare HMO

## 2016-01-25 ENCOUNTER — Ambulatory Visit: Payer: Commercial Managed Care - HMO | Attending: Internal Medicine | Admitting: Physical Therapy

## 2016-01-25 ENCOUNTER — Other Ambulatory Visit: Payer: Medicare HMO

## 2016-01-25 DIAGNOSIS — K7400 Hepatic fibrosis, unspecified: Secondary | ICD-10-CM

## 2016-01-25 DIAGNOSIS — M5441 Lumbago with sciatica, right side: Secondary | ICD-10-CM | POA: Insufficient documentation

## 2016-01-25 DIAGNOSIS — M62830 Muscle spasm of back: Secondary | ICD-10-CM | POA: Insufficient documentation

## 2016-01-25 DIAGNOSIS — R262 Difficulty in walking, not elsewhere classified: Secondary | ICD-10-CM | POA: Insufficient documentation

## 2016-01-25 DIAGNOSIS — B182 Chronic viral hepatitis C: Secondary | ICD-10-CM

## 2016-01-25 DIAGNOSIS — M6281 Muscle weakness (generalized): Secondary | ICD-10-CM | POA: Insufficient documentation

## 2016-01-25 DIAGNOSIS — I2510 Atherosclerotic heart disease of native coronary artery without angina pectoris: Secondary | ICD-10-CM | POA: Diagnosis not present

## 2016-01-26 LAB — PROTIME-INR
INR: 1.1
Prothrombin Time: 12 s — ABNORMAL HIGH (ref 9.0–11.5)

## 2016-01-26 LAB — HEPATITIS B CORE ANTIBODY, TOTAL: Hep B Core Total Ab: NONREACTIVE

## 2016-01-26 LAB — AFP TUMOR MARKER: AFP TUMOR MARKER: 14.4 ng/mL — AB (ref <6.1)

## 2016-01-26 LAB — HIV ANTIBODY (ROUTINE TESTING W REFLEX): HIV 1&2 Ab, 4th Generation: NONREACTIVE

## 2016-01-26 LAB — HEPATITIS A ANTIBODY, TOTAL: HEP A TOTAL AB: REACTIVE — AB

## 2016-01-26 LAB — HEPATITIS B SURFACE ANTIBODY,QUALITATIVE: HEP B S AB: NEGATIVE

## 2016-01-26 LAB — HEPATITIS B SURFACE ANTIGEN: HEP B S AG: NEGATIVE

## 2016-01-29 LAB — LIVER FIBROSIS, FIBROTEST-ACTITEST
ALT: 63 U/L — AB (ref 9–46)
Alpha-2-Macroglobulin: 293 mg/dL — ABNORMAL HIGH (ref 106–279)
Apolipoprotein A1: 176 mg/dL (ref 94–176)
BILIRUBIN: 0.3 mg/dL (ref 0.2–1.2)
Fibrosis Score: 0.49
GGT: 183 U/L — AB (ref 3–85)
HAPTOGLOBIN: 97 mg/dL (ref 43–212)
Necroinflammat ACT Score: 0.44
Reference ID: 1571257

## 2016-01-29 LAB — HCV RNA,LIPA RFLX NS5A DRUG RESIST

## 2016-01-30 LAB — HCV RNA NS5A DRUG RESISTANCE

## 2016-02-01 ENCOUNTER — Encounter: Payer: Self-pay | Admitting: Internal Medicine

## 2016-02-01 ENCOUNTER — Ambulatory Visit (INDEPENDENT_AMBULATORY_CARE_PROVIDER_SITE_OTHER): Payer: Commercial Managed Care - HMO | Admitting: Internal Medicine

## 2016-02-01 ENCOUNTER — Ambulatory Visit: Payer: Commercial Managed Care - HMO | Admitting: Physical Therapy

## 2016-02-01 VITALS — BP 159/101 | HR 79 | Temp 97.7°F | Ht 73.5 in | Wt 162.0 lb

## 2016-02-01 DIAGNOSIS — M6281 Muscle weakness (generalized): Secondary | ICD-10-CM

## 2016-02-01 DIAGNOSIS — M5441 Lumbago with sciatica, right side: Secondary | ICD-10-CM

## 2016-02-01 DIAGNOSIS — R262 Difficulty in walking, not elsewhere classified: Secondary | ICD-10-CM

## 2016-02-01 DIAGNOSIS — Z23 Encounter for immunization: Secondary | ICD-10-CM | POA: Diagnosis not present

## 2016-02-01 DIAGNOSIS — M62830 Muscle spasm of back: Secondary | ICD-10-CM | POA: Diagnosis not present

## 2016-02-01 DIAGNOSIS — B182 Chronic viral hepatitis C: Secondary | ICD-10-CM | POA: Diagnosis not present

## 2016-02-01 MED ORDER — LEDIPASVIR-SOFOSBUVIR 90-400 MG PO TABS: 1.0000 | ORAL_TABLET | Freq: Every day | ORAL | Status: DC

## 2016-02-01 NOTE — Progress Notes (Signed)
[facility] for Infectious Disease   CC: consideration for treatment for chronic hepatitis C  HPI:  +Jonathan [name] is a 60 y.o. male who presents for initial evaluation and management of chronic hepatitis C.  Patient tested positive about 15 years ago. Hepatitis C-associated risk factors present are: none. Patient denies intranasal drug use, IV drug abuse, renal dialysis, sexual contact with person with liver disease, tattoos. Patient has had other studies performed. Results: hepatitis C RNA by PCR, result: positive. Patient has had prior treatment for Hepatitis C. Patient does not have a past history of liver disease. Patient does not have a family history of liver disease. Patient does not  have associated signs or symptoms related to liver disease.  Labs reviewed and confirm chronic hepatitis C with a positive viral load.   Records reviewed from record, has genotype 1a, no baseline resistance.  He was previously treated around 2004 and he remembers 3 times weekly PEG and daily ribavirin.  He does not recall how long he did this but thinks about 1 month.  Noted a lot of side effects.  Has not had any treatment since.        Patient does have documented immunity to Hepatitis A. Patient does not have documented immunity to Hepatitis B.    Review of Systems:   Constitutional: negative for fatigue and malaise Musculoskeletal: negative for myalgias and arthralgias All other systems reviewed and are negative      Past Medical History  Diagnosis Date  . ERECTILE DYSFUNCTION 06/05/2006    Qualifier: Diagnosis of  By: Grandville Silos MD, Quillian Quince    . Hypertension   . POST-POLIO SYNDROME 06/05/2006    Hx of polio at age 50 Has spasms in his legs on and off Used Flexeril on and off for spasms 2015 May >> sent to rehab/PT. rec new leg braces/ortho    . Essential hypertension 06/05/2006    Dx in 2008  Has BP machine at home  Checks erratically  Chronic no show issues      . Chronic viral  hepatitis C ([place]) 06/05/2006    History of blood transfusion in childhood Dx 2010 Reports history of Rx while in [place], unknown yr Elevated LFTs since 2010 and repeat 2014 U/S 2009 - no cirrhosis, repeat in 12/2013 >> normal.  Fibrosure >> pending results  Referred to Hep C clinic 04/23/2013 >> appt in 06/2013 Hep C clinic [phone] Immunity to hep A per labs at Hep C clinic  Started on Hep B series 1 st dose 02/03/2014, 2 dose given 04/23/2014. Last dose about 09/2014.  Follow with Hep C clinic       . Polysubstance abuse 04/23/2013    Cocaine (sniffs powder) and marijuana No IVDU   . Chronic Bil Foot Pain 05/27/2011    Lt > Rt - since age 69 2/2 to post polio syndrome  Corns and callosities on both feet  Reconstruction surgery at age 75. Uses a brace on left leg for many years On prn tylenol for pain  No candidate for opiates due to active substance abuse Podiatry and PT (for brace eval) on 04/23/2013      Prior to Admission medications   Medication Sig Start Date End Date Taking? Authorizing Provider  gabapentin (NEURONTIN) 300 MG capsule Take 1 capsule (300 mg total) by mouth at bedtime. 12/16/15 12/15/16 Yes Corky Sox, MD  lisinopril-hydrochlorothiazide (PRINZIDE,ZESTORETIC) 20-25 MG tablet Take 1 tablet by mouth daily. 12/16/15  Yes Ernst Bowler  Ronnald Ramp, MD  meloxicam (MOBIC) 15 MG tablet Take 1 tablet (15 mg total) by mouth daily. 12/16/15  Yes Corky Sox, MD  Ledipasvir-Sofosbuvir (HARVONI) 90-400 MG TABS Take 1 tablet by mouth daily. 02/01/16   Thayer Headings, MD    No Known Allergies  Social History  Substance Use Topics  . Smoking status: Current Every Day Smoker -- 0.50 packs/day    Types: Cigarettes  . Smokeless tobacco: Never Used  . Alcohol Use: 12.6 oz/week    21 Standard drinks or equivalent per week     Comment: beer everyday    Family History  Problem Relation Age of Onset  . Cancer Mother   no liver cancer   Objective:  Constitutional: in no apparent distress and alert,    Filed Vitals:   02/01/16 1029  BP: 159/101  Pulse: 79  Temp: 97.7 F (36.5 C)   Eyes: anicteric Cardiovascular: Cor RRR and No murmurs Respiratory: CTA B; normal respiratory effort Gastrointestinal: Bowel sounds are normal, liver is not enlarged, spleen is not enlarged Musculoskeletal: no pedal edema noted Skin: negatives: no rash; no porphyria cutanea tarda Lymphatic: no cervical lymphadenopathy   Laboratory Genotype: No results found for: HCVGENOTYPE HCV viral load:  Lab Results  Component Value Date   HCVQUANT 213000* 01/15/2008   Lab Results  Component Value Date   WBC 4.1 12/16/2015   HGB 12.5* 03/24/2011   HCT 39.4 12/16/2015   MCV 92 12/16/2015   PLT 276 12/16/2015    Lab Results  Component Value Date   CREATININE 1.28* 12/16/2015   BUN 16 12/16/2015   NA 145* 12/16/2015   K 3.7 12/16/2015   CL 107* 12/16/2015   CO2 22 12/16/2015    Lab Results  Component Value Date   ALT 63* 01/25/2016   AST 91* 12/16/2015   ALKPHOS 73 12/16/2015     Labs and history reviewed and show CHILD-PUGH A  5-6 points: Child class A 7-9 points: Child class B 10-15 points: Child class C  Lab Results  Component Value Date   INR 1.1 01/25/2016   BILITOT 0.6 12/16/2015   ALBUMIN 3.8 12/16/2015     Assessment: New Patient with Chronic Hepatitis C genotype 1a, untreated.  I discussed with the patient the lab findings that confirm chronic hepatitis C as well as the natural history and progression of disease including about 30% of people who develop cirrhosis of the liver if left untreated and once cirrhosis is established there is a 2-7% risk per year of liver cancer and liver failure.  I discussed the importance of treatment and benefits in reducing the risk, even if significant liver fibrosis exists.   Plan: 1) Patient counseled extensively on limiting acetaminophen to no more than 2 grams daily, avoidance of alcohol. 2) Transmission discussed with patient including  sexual transmission, sharing razors and toothbrush.   3) Will need referral to gastroenterology if concern for cirrhosis 4) Will need referral for substance abuse counseling: No.; Further work up to include urine drug screen  No. 5) Will prescribe Harvoni for 12 weeks.  Though he is treatment experienced with PEG/Riba, new guidelines do not suggest adding ribarvirin without cirrhosis and he is F2 on Fibrosure without any lab abnormalities and so I do not feel ribavirin will add anything and will just do Harvoni for 12 weeks.   6) Hepatitis A vaccine No. 7) Hepatitis B vaccine Yes.   8) Pneumovax vaccine if concern for cirrhosis 9) Further work up to  include liver staging with elastography 10) will follow up after starting medication.

## 2016-02-01 NOTE — Therapy (Addendum)
[place], [place], [address] Phone: [phone]   Fax:  [phone]  Physical Therapy Treatment  Patient Details  Name: Jonathan [name] MRN: [id number] Date of Birth: [birth year] Referring Provider: Dr Luanne Bras  Encounter Date: 02/01/2016      PT End of Session - 02/01/16 0934    Visit Number 2   Number of Visits 16   Date for PT Re-Evaluation 02/23/16   Authorization Type [employer]    PT Start Time 0930   PT Stop Time 1010   PT Time Calculation (min) 40 min      Past Medical History  Diagnosis Date  . ERECTILE DYSFUNCTION 06/05/2006    Qualifier: Diagnosis of  By: Grandville Silos MD, Quillian Quince    . Hypertension   . POST-POLIO SYNDROME 06/05/2006    Hx of polio at age 60 Has spasms in his legs on and off Used Flexeril on and off for spasms 2015 May >> sent to rehab/PT. rec new leg braces/ortho    . Essential hypertension 06/05/2006    Dx in 2008  Has BP machine at home  Checks erratically  Chronic no show issues      . Chronic viral hepatitis C ([place]) 06/05/2006    History of blood transfusion in childhood Dx 2010 Reports history of Rx while in [place], unknown yr Elevated LFTs since 2010 and repeat 2014 U/S 2009 - no cirrhosis, repeat in 12/2013 >> normal.  Fibrosure >> pending results  Referred to Hep C clinic 04/23/2013 >> appt in 06/2013 Hep C clinic [phone] Immunity to hep A per labs at Hep C clinic  Started on Hep B series 1 st dose 02/03/2014, 2 dose given 04/23/2014. Last dose about 09/2014.  Follow with Hep C clinic       . Polysubstance abuse 04/23/2013    Cocaine (sniffs powder) and marijuana No IVDU   . Chronic Bil Foot Pain 05/27/2011    Lt > Rt - since age 60 2/2 to post polio syndrome  Corns and callosities on both feet  Reconstruction surgery at age 60. Uses a brace on left leg for many years On prn tylenol for pain  No candidate for opiates due to active substance abuse Podiatry and PT (for  brace eval) on 04/23/2013      Past Surgical History  Procedure Laterality Date  . Knee arthroscopy Right   . Fracture surgery      There were no vitals filed for this visit.      Subjective Assessment - 02/01/16 0933    Subjective The exercises are helping. It's hard to pay $40 every visit.    Currently in Pain? Yes   Pain Score 6    Pain Location Back   Pain Orientation Right   Aggravating Factors  standing prolonged 1 hour   Pain Relieving Factors stretching, inflammation meds            [facility] PT Assessment - 02/01/16 0001    Observation/Other Assessments   Focus on Therapeutic Outcomes (FOTO)  50% limited improved from 58% limited on eval                     [facility] Adult PT Treatment/Exercise - 02/01/16 0001    Lumbar Exercises: Supine   Ab Set 5 reps   AB Set Limitations with breathing   Heel Slides 20 reps;10 reps   Bent Knee Raise 10 reps   Bridge 10 reps  Straight Leg Raise 10 reps   Lumbar Exercises: Sidelying   Clam 10 reps   Hip Abduction 10 reps                PT Education - 02/01/16 0959    Education provided Yes   Education Details HEP for core and LE strength   Person(s) Educated Patient   Methods Explanation;Handout   Comprehension Verbalized understanding          PT Short Term Goals - 02/01/16 0940    PT SHORT TERM GOAL #1   Title Patient will increase ability to perfrom core contraction to good    Time 4   Period Weeks   Status On-going   PT SHORT TERM GOAL #2   Title Patient will report centralized lower back pain > 4/10    Time 4   Period Weeks   Status On-going   PT SHORT TERM GOAL #3   Title Patient will increase right hip flexion strength to 4+/5    Baseline 4/5    Time 4   Period Weeks   Status On-going   PT SHORT TERM GOAL #4   Title Patient will be I w/ HEP for core strengthening    Time 4   Period Weeks   Status On-going           PT Long Term Goals - 02/01/16 5681    PT LONG TERM  GOAL #1   Title Patient will stand at work for 2 hours without increased radicular pain or pain in his lower back.    Time 8   Period Weeks   Status On-going   PT LONG TERM GOAL #2   Title Patient will ambulate 3000' w/o increased pain in order to go to the store and shop    Time 8   Period Weeks   Status On-going   PT LONG TERM GOAL #3   Title Patients FOTO score will show a 49% deficit    Time 8   Period Weeks   Status On-going   PT LONG TERM GOAL #4   Title Patient will put his shoes and socks on without pain    Time 8   Period Weeks   Status Achieved               Plan - 02/01/16 0950    Clinical Impression Statement Pt reports stretches have been helpful so he has not attended PT. He reports high copay limits his attendance also. He reports decreased intensity of pain however no change in functional limitations like walking and standing tolerance. He is able to don shoes and socks without increased pain. LTG#4 Met. His FOTO score has improved. Instructed pt in hip and core strength and updated HEP. Will assess and update HEP in 2 weeks if needed.    PT Next Visit Plan review HEP. Give HEP for strengthening and progress PRN . Patient may only come for a few visits 2nd to a high co-pay.       Patient will benefit from skilled therapeutic intervention in order to improve the following deficits and impairments:  Abnormal gait, Decreased activity tolerance, Decreased balance, Decreased strength, Decreased mobility, Postural dysfunction, Improper body mechanics, Decreased endurance, Difficulty walking, Pain  Visit Diagnosis: Right-sided low back pain with right-sided sciatica  Muscle spasm of back  Muscle weakness (generalized)  Difficulty in walking, not elsewhere classified     Problem List Patient Active Problem List   Diagnosis Date Noted  .  Lumbar radiculopathy 12/16/2015  . Right hip pain 12/16/2015  . Lesion of finger 04/23/2014  . Polysubstance abuse  04/23/2013  . Preventive measure 05/27/2011  . Chronic Bil Foot Pain 05/27/2011  . Elevated Liver Enzymes 10/13/2008  . Smoking 07/26/2008  . Chronic viral hepatitis C ([place]) 06/05/2006  . POST-POLIO SYNDROME 06/05/2006  . Essential hypertension 06/05/2006   PHYSICAL THERAPY DISCHARGE SUMMARY    Patient had some difficulty with his co-pay. He was scheduled to come back in 2 weeks but did not return. D/C at this time.  Visits from Start of Care: 2  Current functional level related to goals / functional outcomes: unknown   Remaining deficits: unknown   Education / Equipment:unknown Plan: Patient agrees to discharge.  Patient goals were not met. Patient is being discharged due to meeting the stated rehab goals.  ?????      Dorene Ar, [place] 02/01/2016, 2:28 PM     [facility] [address] [place], [place], [address] Phone: [phone]   Fax:  [phone]  Name: SANJUAN SAWA MRN: [id number] Date of Birth: 15-Jul-1956

## 2016-02-01 NOTE — Patient Instructions (Addendum)
Date 02/01/2016  Dear Jonathan [name], As discussed in the [facility], your hepatitis C therapy will include the following medications:          Harvoni 90mg /400mg  tablet:           Take 1 tablet by mouth once daily with ribavirin  Please note that ALL MEDICATIONS WILL START ON THE SAME DATE for a total of 12 weeks. ---------------------------------------------------------------- Your HCV Treatment Start Date: TBA   Your HCV genotype:  1a    Liver Fibrosis: TBD    ---------------------------------------------------------------- YOUR PHARMACY CONTACT:   [place] [facility] [place] Phone: [phone] Hours: Monday to Friday 7:30 am to 6:00 pm   Please always contact your pharmacy at least 3-4 business days before you run out of medications to ensure your next month's medication is ready or 1 week prior to running out if you receive it by mail.  Remember, each prescription is for 28 days. ---------------------------------------------------------------- GENERAL NOTES REGARDING YOUR HEPATITIS C MEDICATION:  SOFOSBUVIR/LEDIPASVIR (HARVONI): - Harvoni tablet is taken daily with OR without food. - The tablets are orange. - The tablets should be stored at room temperature.  - Acid reducing agents such as H2 blockers (ie. Pepcid (famotidine), Zantac (ranitidine), Tagamet (cimetidine), Axid (nizatidine) and proton pump inhibitors (ie. Prilosec (omeprazole), Protonix (pantoprazole), Nexium (esomeprazole), or Aciphex (rabeprazole)) can decrease effectiveness of Harvoni. Do not take until you have discussed with a health care provider.    -Antacids that contain magnesium and/or aluminum hydroxide (ie. Milk of Magensia, Rolaids, Gaviscon, Maalox, Mylanta, an dArthritis Pain Formula)can reduce absorption of Harvoni, so take them at least 4 hours before or after Harvoni.  -Calcium carbonate (calcium supplements or antacids such as  Tums, Caltrate, Os-Cal)needs to be taken at least 4 hours hours before or after Harvoni.  -St. John's wort or any products that contain St. John's wort like some herbal supplements  Please inform the office prior to starting any of these medications.  - The common side effects associated with Harvoni and ribavirin include:      1. Fatigue      2. Headache      3. Nausea      4. Diarrhea      5. Insomnia  Please note that this only lists the most common side effects and is NOT a comprehensive list of the potential side effects of these medications. For more information, please review the drug information sheets that come with your medication package from the pharmacy.  ---------------------------------------------------------------- GENERAL HELPFUL HINTS ON HCV THERAPY: 1. Stay well-hydrated. 2. Notify the [facility] of any changes in your other over-the-counter/herbal or prescription medications. 3. If you miss a dose of your medication, take the missed dose as soon as you remember. Return to your regular time/dose schedule the next day.  4.  Do not stop taking your medications without first talking with your healthcare provider. 5.  You may take Tylenol (acetaminophen), as long as the dose is less than 2000 mg (OR no more than 4 tablets of the Tylenol Extra Strengths 500mg  tablet) in 24 hours. 6.  You will see our pharmacist-specialist within the first 2 weeks of starting your medication. 7.  You will need to obtain routine labs around week 4 and12 weeks after starting and then 3 to 6 months after finishing Harvoni.    Scharlene Gloss, [place] [facility]  [place], [place]  [address] [phone]

## 2016-02-01 NOTE — Patient Instructions (Signed)
     Isometric Hold With Pelvic Floor (Hook-Lying)  Lie with hips and knees bent. Slowly inhale, and then exhale. Pull navel toward spine and tighten pelvic floor. Hold for __10_ seconds. Continue to breathe in and out during hold. Rest for _10__ seconds. Repeat __10_ times. Do __2-3_ times a day.   Knee Fold  Lie on back, legs bent, arms by sides. Exhale, lifting knee to chest. Inhale, returning. Keep abdominals flat, navel to spine. Repeat __10__ times, alternating legs. Do __2__ sessions per day. Heel Slide to Straight   Tighten abdominals  Then Slide one leg down to straight. Return. Be sure pelvis does not rock forward, tilt, rotate, or tip to side. Do _10__ times. Restabilize pelvis. Repeat with other leg. Do __1-2_ sets, __2_ times per day.     Copyright  VHI. All rights reserved.       Straight Leg Raise    Tighten stomach and slowly raise locked right leg __12__ inches from floor.  Repeat _10___ times per set on each leg. Do __1-2__ sets per session. Do ___2_ sessions per day.  Abduction: Clam (Eccentric) - Side-Lying    Lie on side with knees bent. TIGHTEN ABDOMINALS. Lift top knee, keeping feet together. Keep trunk steady. Slowly lower for 3-5 seconds. __10-20_ reps per set, __1-2_ sets per day, _7__ days per week.  Abduction: Side Leg Lift (Eccentric) - Side-Lying    Lie on side. TIGHTEN ABDOMINALS  Lift top leg slightly higher than shoulder level. Keep top leg straight with body, toes pointing forward. Slowly lower for 3-5 seconds. __10-20_ reps per set, __2_ sets per day, _7__ days per week.  Bridge    TIGHTEN ABDOMINALS. Lie back, legs bent. Inhale, pressing hips up. Keeping ribs in, lengthen lower back. Exhale, rolling down along spine from top. Repeat __10__ times. Do _2___ sessions per day.

## 2016-02-07 ENCOUNTER — Encounter: Payer: Self-pay | Admitting: Pharmacy Technician

## 2016-02-07 MED FILL — *HARVONI 90-400 MG TABLET: 90-400 | 28 days supply | Qty: 28 | Fill #0

## 2016-02-09 ENCOUNTER — Ambulatory Visit (INDEPENDENT_AMBULATORY_CARE_PROVIDER_SITE_OTHER): Payer: Commercial Managed Care - HMO | Admitting: Internal Medicine

## 2016-02-09 ENCOUNTER — Encounter: Payer: Self-pay | Admitting: Internal Medicine

## 2016-02-09 VITALS — BP 132/89 | HR 76 | Temp 97.7°F | Ht 73.0 in | Wt 160.2 lb

## 2016-02-09 DIAGNOSIS — G6289 Other specified polyneuropathies: Secondary | ICD-10-CM

## 2016-02-09 DIAGNOSIS — Z299 Encounter for prophylactic measures, unspecified: Secondary | ICD-10-CM | POA: Diagnosis not present

## 2016-02-09 DIAGNOSIS — B182 Chronic viral hepatitis C: Secondary | ICD-10-CM | POA: Diagnosis not present

## 2016-02-09 DIAGNOSIS — I10 Essential (primary) hypertension: Secondary | ICD-10-CM | POA: Diagnosis not present

## 2016-02-09 DIAGNOSIS — G629 Polyneuropathy, unspecified: Secondary | ICD-10-CM | POA: Insufficient documentation

## 2016-02-09 NOTE — Assessment & Plan Note (Signed)
A: Patient reports 6 months worth of painful burning and tingling in his bilateral hands that occurs a few times per month.  He cannot cite any provoking factors and the symptoms do not seem to occur at a particular time of day.  He has no history of DM and glucose on metabolic panels would not suggest this despite no recorded A1C's.  TSH normal 2012, B12 normal 2009, RPR negative 2009.  Possible that he is experiencing chronic inflammatory demyelinating polyneuropathy due to his chronic HCV.  On exam, he has normal strength and sensation.  May also be presentation secondary to carpal tunnel syndrome.  P: - will recheck another RPR - nerve conduction studies for diagnostic purposes

## 2016-02-09 NOTE — Assessment & Plan Note (Signed)
BP Readings from Last 3 Encounters:  02/09/16 132/89  02/01/16 159/101  12/16/15 137/95   A:  BP today is well controlled on lisinopril-HCTZ combination of 20-25mg  daily.  P: - continue current medications.

## 2016-02-09 NOTE — Assessment & Plan Note (Signed)
A: Patient asking about getting colonoscopy earlier than recommended 10 year interval.  He reports no melena, BRBPR, abdominal pain, or other concerning symptoms.  P: - stool cards given today.

## 2016-02-09 NOTE — Patient Instructions (Signed)
Thank you for coming to see me today. It was a pleasure. Today we talked about:   Tingling in your hands: i have ordered a blood test and will order nerve conduction studies to try and determine the cause.   Otherwise, continue taking all your other medications.  Please follow-up with me in 6 months.  If you have any questions or concerns, please do not hesitate to call the office at [phone].  Take Care,   Jule Ser, DO

## 2016-02-09 NOTE — Progress Notes (Signed)
Patient ID: Jonathan [name], male   DOB: 1955-12-06, 60 y.o.   MRN: [id number]   CC: follow up for HTN, complaining of tingling in his hands.  HPI:  [name] is a 60 y.o. man with PMHx as below presenting today for follow up BP and reports a burning and tingling sensation in his bilateral hands.  Please see A&P for status of medical conditions addressed today.  Past Medical History  Diagnosis Date  . ERECTILE DYSFUNCTION 06/05/2006    Qualifier: Diagnosis of  By: Grandville Silos MD, Quillian Quince    . Hypertension   . POST-POLIO SYNDROME 06/05/2006    Hx of polio at age 89 Has spasms in his legs on and off Used Flexeril on and off for spasms 2015 May >> sent to rehab/PT. rec new leg braces/ortho    . Essential hypertension 06/05/2006    Dx in 2008  Has BP machine at home  Checks erratically  Chronic no show issues      . Chronic viral hepatitis C ([place]) 06/05/2006    History of blood transfusion in childhood Dx 2010 Reports history of Rx while in [place], unknown yr Elevated LFTs since 2010 and repeat 2014 U/S 2009 - no cirrhosis, repeat in 12/2013 >> normal.  Fibrosure >> pending results  Referred to Hep C clinic 04/23/2013 >> appt in 06/2013 Hep C clinic [phone] Immunity to hep A per labs at Hep C clinic  Started on Hep B series 1 st dose 02/03/2014, 2 dose given 04/23/2014. Last dose about 09/2014.  Follow with Hep C clinic       . Polysubstance abuse 04/23/2013    Cocaine (sniffs powder) and marijuana No IVDU   . Chronic Bil Foot Pain 05/27/2011    Lt > Rt - since age 68 2/2 to post polio syndrome  Corns and callosities on both feet  Reconstruction surgery at age 89. Uses a brace on left leg for many years On prn tylenol for pain  No candidate for opiates due to active substance abuse Podiatry and PT (for brace eval) on 04/23/2013      Review of Systems:   Review of Systems  Constitutional: Negative for fever and chills.  Musculoskeletal: Positive for back pain and joint pain. Negative for  falls.  Neurological: Positive for tingling.     Physical Exam:  Filed Vitals:   02/09/16 1545  BP: 132/89  Pulse: 76  Temp: 97.7 F (36.5 C)  TempSrc: Oral  Height: 6\' 1"  (1.854 m)  Weight: 160 lb 3.2 oz (72.666 kg)  SpO2: 100%   Physical Exam  Constitutional: He is oriented to person, place, and time and well-developed, well-nourished, and in no distress.  Very pleasant, African American man, ambulates with cane.  HENT:  Head: Normocephalic and atraumatic.  Cardiovascular: Normal rate and regular rhythm.   Pulmonary/Chest: Effort normal and breath sounds normal.  Musculoskeletal:  Normal 5/5 strength. Normal sensation.   Neurological: He is alert and oriented to person, place, and time.  Skin: Skin is warm and dry. No rash noted.     Assessment & Plan:   See encounters tab for problem based medical decision making.   Patient discussed with Dr. Daryll Drown   Essential hypertension BP Readings from Last 3 Encounters:  02/09/16 132/89  02/01/16 159/101  12/16/15 137/95   A:  BP today is well controlled on lisinopril-HCTZ combination of 20-25mg  daily.  P: - continue current medications.  Chronic viral hepatitis C A: Patient started Harvoni yesterday.  Will take for 12 weeks.  P: - continue Harvoni - follow up as scheduled with [facility]  Preventive measure A: Patient asking about getting colonoscopy earlier than recommended 10 year interval.  He reports no melena, BRBPR, abdominal pain, or other concerning symptoms.  P: - stool cards given today.  Peripheral neuropathy (HCC) A: Patient reports 6 months worth of painful burning and tingling in his bilateral hands that occurs a few times per month.  He cannot cite any provoking factors and the symptoms do not seem to occur at a particular time of day.  He has no history of DM and glucose on metabolic panels would not suggest this despite no recorded A1C's.  TSH normal 2012, B12 normal 2009, RPR negative 2009.   Possible that he is experiencing chronic inflammatory demyelinating polyneuropathy due to his chronic HCV.  On exam, he has normal strength and sensation.  May also be presentation secondary to carpal tunnel syndrome.  P: - will recheck another RPR - nerve conduction studies for diagnostic purposes

## 2016-02-09 NOTE — Assessment & Plan Note (Signed)
A: Patient started Harvoni yesterday.  Will take for 12 weeks.  P: - continue Harvoni - follow up as scheduled with [facility]

## 2016-02-10 ENCOUNTER — Ambulatory Visit
Admission: RE | Admit: 2016-02-10 | Discharge: 2016-02-10 | Disposition: A | Discharge status: Home / Self Care | Payer: Commercial Managed Care - HMO | Source: Ambulatory Visit | Attending: Internal Medicine | Admitting: Internal Medicine

## 2016-02-10 DIAGNOSIS — B182 Chronic viral hepatitis C: Secondary | ICD-10-CM

## 2016-02-10 DIAGNOSIS — K824 Cholesterolosis of gallbladder: Secondary | ICD-10-CM | POA: Diagnosis not present

## 2016-02-10 LAB — RPR: RPR: NONREACTIVE

## 2016-02-13 ENCOUNTER — Telehealth: Payer: Self-pay | Admitting: *Deleted

## 2016-02-13 NOTE — Telephone Encounter (Signed)
Ultrasound cancelled and patient notified. Jonathan [name]

## 2016-02-13 NOTE — Telephone Encounter (Signed)
-----   Message from Thayer Headings, MD sent at 02/10/2016 12:10 PM EDT ----- Please cancel his elastography appt since we just got a regular ultrasound and have a firbrosure.  I was worried with a high AFP but it is ok.   His PCP who ordered it is copied so he knows.  Thanks Rob

## 2016-02-14 NOTE — Progress Notes (Signed)
Internal Medicine Clinic Attending  Case discussed with Dr. Wallace at the time of the visit.  We reviewed the resident's history and exam and pertinent patient test results.  I agree with the assessment, diagnosis, and plan of care documented in the resident's note.  

## 2016-02-22 ENCOUNTER — Ambulatory Visit (INDEPENDENT_AMBULATORY_CARE_PROVIDER_SITE_OTHER): Payer: Commercial Managed Care - HMO | Admitting: Pharmacist Clinician (PhC)/ Clinical Pharmacy Specialist

## 2016-02-22 ENCOUNTER — Ambulatory Visit (HOSPITAL_COMMUNITY): Payer: Medicare HMO

## 2016-02-22 DIAGNOSIS — B182 Chronic viral hepatitis C: Secondary | ICD-10-CM

## 2016-02-22 NOTE — Progress Notes (Signed)
Patient ID: Jonathan [name], male   DOB: Jul 22, 1956, 60 y.o.   MRN: [id number]  HPI: Jonathan [name] is a 60 y.o. male who presents for follow-up on chronic hepatitis C, genotype 1a. Patient tested positive about 15 years ago. Hepatitis C-associated risk factors present are: IVDU (one time per patient), blood transfusion "a long time ago"  Allergies: No Known Allergies  Past Medical History: Past Medical History:  Diagnosis Date  . Chronic Bil Foot Pain 05/27/2011   Lt > Rt - since age 43 2/2 to post polio syndrome  Corns and callosities on both feet  Reconstruction surgery at age 87. Uses a brace on left leg for many years On prn tylenol for pain  No candidate for opiates due to active substance abuse Podiatry and PT (for brace eval) on 04/23/2013    . Chronic viral hepatitis C ([place]) 06/05/2006   History of blood transfusion in childhood Dx 2010 Reports history of Rx while in [place], unknown yr Elevated LFTs since 2010 and repeat 2014 U/S 2009 - no cirrhosis, repeat in 12/2013 >> normal.  Fibrosure >> pending results  Referred to Hep C clinic 04/23/2013 >> appt in 06/2013 Hep C clinic [phone] Immunity to hep A per labs at Hep C clinic  Started on Hep B series 1 st dose 02/03/2014, 2 dose given 04/23/2014. Last dose about 09/2014.  Follow with Hep C clinic       . ERECTILE DYSFUNCTION 06/05/2006   Qualifier: Diagnosis of  By: Grandville Silos MD, Quillian Quince    . Essential hypertension 06/05/2006   Dx in 2008  Has BP machine at home  Checks erratically  Chronic no show issues      . Hypertension   . Polysubstance abuse 04/23/2013   Cocaine (sniffs powder) and marijuana No IVDU   . POST-POLIO SYNDROME 06/05/2006   Hx of polio at age 81 Has spasms in his legs on and off Used Flexeril on and off for spasms 2015 May >> sent to rehab/PT. rec new leg braces/ortho      Social History: Social History   Social History  . Marital status: Single    Spouse name: N/A  . Number of children: N/A  . Years of  education: N/A   Social History Main Topics  . Smoking status: Current Every Day Smoker    Packs/day: 0.50    Types: Cigarettes  . Smokeless tobacco: Never Used  . Alcohol use 12.6 oz/week    21 Standard drinks or equivalent per week     Comment: beer everyday  . Drug use:     Types: Marijuana     Comment: marijuana  . Sexual activity: Not Currently   Other Topics Concern  . Not on file   Social History Narrative  . No narrative on file    Labs: Hep B S Ab (no units)  Date Value  01/25/2016 NEG   Hepatitis B Surface Ag (no units)  Date Value  01/25/2016 NEGATIVE    No results found for: HCVGENOTYPE, HEPCGENOTYPE  Hepatitis C RNA quantitative Latest Ref Rng & Units 01/15/2008  HCV Quantitative <43 intl units/mL 213000(H)    AST  Date Value  12/16/2015 91 IU/L (H)  04/23/2013 75 U/L (H)  03/24/2011 55 U/L (H)   ALT  Date Value  01/25/2016 63 U/L (H)  12/16/2015 85 IU/L (H)  04/23/2013 91 U/L (H)  03/24/2011 62 U/L (H)   INR (no units)  Date Value  01/25/2016 1.1  10/13/2008 1.1  06/29/2008 1.1    CrCl: CrCl cannot be calculated (Patient's most recent lab result is older than the maximum 21 days allowed.).  Fibrosis Score: 0.49 (F2) - moderate fibrosis  Previous Treatment Regimen: 2004 PEG 3x weekly + ribavirin daily  Assessment: Patient reports not missing any doses. He seems very motivated to complete therapy after having failed PEG/Riba treatment and endorses a lot of side effects with it. He denies any side effects with Harvoni. Reviewed medications; no interactions; no OTC antacid use.   Recommendations: -Continue Harvoni to complete 12 week total therapy -Return to clinic on 8/18 for labs -F/u visit with Dr. Linus Salmons on 10/9   Gwenlyn Perking, PharmD PGY1 Pharmacy Resident Pager: [phone] 02/22/2016 1:48 PM    Agreed with Manie Bealer's note.   Onnie Boer, PharmD Pager: [phone] 02/22/2016 2:07 PM

## 2016-02-22 NOTE — Patient Instructions (Signed)
Continue Harvoni 1 daily x 3 months F/u in two weeks for labs F/u with Dr. Linus Salmons in Oct

## 2016-02-23 ENCOUNTER — Ambulatory Visit: Payer: Commercial Managed Care - HMO | Attending: Internal Medicine | Admitting: Physical Therapy

## 2016-03-05 MED FILL — *HARVONI 90-400 MG TABLET: 90-400 | 28 days supply | Qty: 28 | Fill #1

## 2016-03-09 ENCOUNTER — Other Ambulatory Visit: Payer: Commercial Managed Care - HMO

## 2016-03-09 DIAGNOSIS — B182 Chronic viral hepatitis C: Secondary | ICD-10-CM | POA: Diagnosis not present

## 2016-03-09 LAB — COMPLETE METABOLIC PANEL WITH GFR
ALT: 14 U/L (ref 9–46)
AST: 23 U/L (ref 10–35)
Albumin: 3.8 g/dL (ref 3.6–5.1)
Alkaline Phosphatase: 54 U/L (ref 40–115)
BUN: 21 mg/dL (ref 7–25)
CALCIUM: 9.6 mg/dL (ref 8.6–10.3)
CHLORIDE: 104 mmol/L (ref 98–110)
CO2: 23 mmol/L (ref 20–31)
Creat: 1.29 mg/dL (ref 0.70–1.33)
GFR, Est African American: 70 mL/min (ref >=60)
GFR, Est Non African American: 61 mL/min (ref >=60)
Glucose, Bld: 125 mg/dL — ABNORMAL HIGH (ref 65–99)
POTASSIUM: 4 mmol/L (ref 3.5–5.3)
Sodium: 138 mmol/L (ref 135–146)
Total Bilirubin: 0.8 mg/dL (ref 0.2–1.2)
Total Protein: 7.3 g/dL (ref 6.1–8.1)

## 2016-03-12 LAB — HEPATITIS C RNA QUANTITATIVE: HCV Quantitative: NOT DETECTED IU/mL (ref <15)

## 2016-03-28 MED FILL — *HARVONI 90-400 MG TABLET: 90-400 | 28 days supply | Qty: 28 | Fill #2

## 2016-04-30 ENCOUNTER — Encounter: Payer: Self-pay | Admitting: Internal Medicine

## 2016-04-30 ENCOUNTER — Ambulatory Visit (INDEPENDENT_AMBULATORY_CARE_PROVIDER_SITE_OTHER): Payer: Commercial Managed Care - HMO | Admitting: Internal Medicine

## 2016-04-30 VITALS — BP 147/89 | HR 69 | Temp 98.0°F | Ht 73.0 in | Wt 159.0 lb

## 2016-04-30 DIAGNOSIS — B182 Chronic viral hepatitis C: Secondary | ICD-10-CM

## 2016-04-30 DIAGNOSIS — K7400 Hepatic fibrosis, unspecified: Secondary | ICD-10-CM

## 2016-04-30 DIAGNOSIS — R7402 Elevation of levels of lactic acid dehydrogenase (LDH): Secondary | ICD-10-CM

## 2016-04-30 DIAGNOSIS — Z23 Encounter for immunization: Secondary | ICD-10-CM

## 2016-04-30 DIAGNOSIS — R740 Nonspecific elevation of levels of transaminase and lactic acid dehydrogenase [LDH]: Secondary | ICD-10-CM

## 2016-04-30 NOTE — Assessment & Plan Note (Signed)
Now resolved on treatment.  Will recheck in 6 months.

## 2016-04-30 NOTE — Assessment & Plan Note (Signed)
Doing well.  EOT lab in 2 weeks and SVR 24 in 6 months and follow up with Pharm D then to confirm cure.  Will not need follow up after that.  Will need hep B#3 then.

## 2016-04-30 NOTE — Assessment & Plan Note (Signed)
F2 on Fibrosure, [place] without any abnormalities, no lab abnormalities, APRI indeterminate due to elevated AST at 0.82 prior to treatment.  No overt signs of cirrhosis so no indication for [place] screening.

## 2016-04-30 NOTE — Progress Notes (Signed)
   Subjective:    Patient ID: Jonathan [name], male    DOB: [birth year], 60 y.o.   MRN: [id number]  HPI Here for follow up on HCV  Started Harvoni and now with only 3 days left to complete the 12 weeks.  Previously treated with PEG/riba.  Some occasional headache.  Pleased with his results.  Getting hepatitis B series.  No missed doses.  No alcohol now. Having some right hip pain from his post polio syndrome. Ultrasound without cirrhosis, fibrosure with F2.  No weight loss.     Review of Systems  Constitutional: Negative for fatigue.  Gastrointestinal: Negative for nausea.  Skin: Negative for rash.  Neurological: Positive for headaches. Negative for dizziness.       Objective:   Physical Exam  Constitutional: He appears well-developed and well-nourished.  Eyes: No scleral icterus.  Cardiovascular: Normal rate, regular rhythm and normal heart sounds.   Lymphadenopathy:    He has no cervical adenopathy.  Skin: No rash noted.   Social History   Social History  . Marital status: Single    Spouse name: N/A  . Number of children: N/A  . Years of education: N/A   Occupational History  . Not on file.   Social History Main Topics  . Smoking status: Current Every Day Smoker    Packs/day: 0.50    Years: 35.00    Types: Cigarettes  . Smokeless tobacco: Never Used  . Alcohol use 12.6 oz/week    21 Standard drinks or equivalent per week     Comment: beer everyday  . Drug use:     Types: Marijuana     Comment: marijuana  . Sexual activity: Not Currently   Other Topics Concern  . Not on file   Social History Narrative  . No narrative on file         Assessment & Plan:

## 2016-05-14 ENCOUNTER — Other Ambulatory Visit: Payer: Commercial Managed Care - HMO

## 2016-05-14 DIAGNOSIS — B182 Chronic viral hepatitis C: Secondary | ICD-10-CM | POA: Diagnosis not present

## 2016-05-15 LAB — COMPLETE METABOLIC PANEL WITH GFR
ALT: 12 U/L (ref 9–46)
AST: 22 U/L (ref 10–35)
Albumin: 3.5 g/dL — ABNORMAL LOW (ref 3.6–5.1)
Alkaline Phosphatase: 60 U/L (ref 40–115)
BUN: 22 mg/dL (ref 7–25)
CHLORIDE: 109 mmol/L (ref 98–110)
CO2: 27 mmol/L (ref 20–31)
Calcium: 9.6 mg/dL (ref 8.6–10.3)
Creat: 1.37 mg/dL — ABNORMAL HIGH (ref 0.70–1.33)
GFR, EST AFRICAN AMERICAN: 65 mL/min (ref >=60)
GFR, EST NON AFRICAN AMERICAN: 56 mL/min — AB (ref >=60)
Glucose, Bld: 184 mg/dL — ABNORMAL HIGH (ref 65–99)
Potassium: 3.7 mmol/L (ref 3.5–5.3)
Sodium: 142 mmol/L (ref 135–146)
Total Bilirubin: 0.5 mg/dL (ref 0.2–1.2)
Total Protein: 6.6 g/dL (ref 6.1–8.1)

## 2016-05-16 LAB — HEPATITIS C RNA QUANTITATIVE: HCV Quantitative: NOT DETECTED IU/mL (ref <15)

## 2016-05-17 ENCOUNTER — Ambulatory Visit (INDEPENDENT_AMBULATORY_CARE_PROVIDER_SITE_OTHER): Payer: Commercial Managed Care - HMO | Admitting: Internal Medicine

## 2016-05-17 ENCOUNTER — Ambulatory Visit (HOSPITAL_COMMUNITY)
Admission: RE | Admit: 2016-05-17 | Discharge: 2016-05-17 | Disposition: A | Discharge status: Home / Self Care | Payer: Commercial Managed Care - HMO | Source: Ambulatory Visit | Attending: Internal Medicine | Admitting: Internal Medicine

## 2016-05-17 ENCOUNTER — Encounter: Payer: Self-pay | Admitting: Internal Medicine

## 2016-05-17 VITALS — BP 129/76 | HR 79 | Temp 97.4°F | Ht 73.0 in | Wt 159.2 lb

## 2016-05-17 DIAGNOSIS — G14 Postpolio syndrome: Secondary | ICD-10-CM

## 2016-05-17 DIAGNOSIS — M160 Bilateral primary osteoarthritis of hip: Secondary | ICD-10-CM | POA: Diagnosis not present

## 2016-05-17 DIAGNOSIS — M25551 Pain in right hip: Secondary | ICD-10-CM

## 2016-05-17 DIAGNOSIS — Z79899 Other long term (current) drug therapy: Secondary | ICD-10-CM

## 2016-05-17 DIAGNOSIS — F17210 Nicotine dependence, cigarettes, uncomplicated: Secondary | ICD-10-CM | POA: Diagnosis not present

## 2016-05-17 DIAGNOSIS — M5416 Radiculopathy, lumbar region: Secondary | ICD-10-CM

## 2016-05-17 DIAGNOSIS — I10 Essential (primary) hypertension: Secondary | ICD-10-CM | POA: Diagnosis not present

## 2016-05-17 MED ORDER — GABAPENTIN 300 MG PO CAPS: 300.0000 mg | ORAL_CAPSULE | Freq: Every day | ORAL | 2 refills | Status: DC

## 2016-05-17 NOTE — Patient Instructions (Signed)
Thank you for coming to see me today. It was a pleasure. Today we talked about:   - hip pain: I have given you a new prescription for Gabapentin.  Please use this with ibuprofen as directed.  We will also get an x-ray of your right hip and call you with any results. - for your form accommodations, please attach the letter provided and also ask your physical therapist to provide you with their documentation.  Please follow-up with me in 6 months or sooner if needed.  If you have any questions or concerns, please do not hesitate to call the office at [phone].  Take Care,   Jule Ser, DO

## 2016-05-17 NOTE — Progress Notes (Signed)
CC: here for f/u right hip pain  HPI:  Jonathan [name] is a 60 y.o. man with a past medical history listed below here today for follow up of his right hip pain.   For details of today's visit and the status of his chronic medical issues please refer to the assessment and plan.   Past Medical History:  Diagnosis Date  . Chronic Bil Foot Pain 05/27/2011   Lt > Rt - since age 34 2/2 to post polio syndrome  Corns and callosities on both feet  Reconstruction surgery at age 57. Uses a brace on left leg for many years On prn tylenol for pain  No candidate for opiates due to active substance abuse Podiatry and PT (for brace eval) on 04/23/2013    . Chronic viral hepatitis C ([place]) 06/05/2006   History of blood transfusion in childhood Dx 2010 Reports history of Rx while in [place], unknown yr Elevated LFTs since 2010 and repeat 2014 U/S 2009 - no cirrhosis, repeat in 12/2013 >> normal.  Fibrosure >> pending results  Referred to Hep C clinic 04/23/2013 >> appt in 06/2013 Hep C clinic [phone] Immunity to hep A per labs at Hep C clinic  Started on Hep B series 1 st dose 02/03/2014, 2 dose given 04/23/2014. Last dose about 09/2014.  Follow with Hep C clinic       . ERECTILE DYSFUNCTION 06/05/2006   Qualifier: Diagnosis of  By: Grandville Silos MD, Quillian Quince    . Essential hypertension 06/05/2006   Dx in 2008  Has BP machine at home  Checks erratically  Chronic no show issues      . Hypertension   . Polysubstance abuse 04/23/2013   Cocaine (sniffs powder) and marijuana No IVDU   . POST-POLIO SYNDROME 06/05/2006   Hx of polio at age 37 Has spasms in his legs on and off Used Flexeril on and off for spasms 2015 May >> sent to rehab/PT. rec new leg braces/ortho      Review of Systems:   Please see pertinent ROS reviewed in HPI and problem based charting.   Physical Exam:  Vitals:   05/17/16 1421  BP: 129/76  Pulse: 79  Temp: 97.4 F (36.3 C)  TempSrc: Oral  SpO2: 100%  Weight: 159 lb 3.2 oz (72.2 kg)    Height: 6\' 1"  (1.854 m)   Physical Exam  Constitutional: He is oriented to person, place, and time.  Pleasant, not distressed, AA man, ambulating with cane.  HENT:  Head: Normocephalic and atraumatic.  Musculoskeletal:  Patient ambulates with a cane due to Left leg deficits from childhood polio.  No obvious deformity of Right hip and no signifcant tenderness.  Normal right sided strength.  Neurological: He is alert and oriented to person, place, and time.     Assessment & Plan:   See Encounters Tab for problem based charting.  Patient discussed with Dr. Eppie Gibson.  Essential hypertension BP Readings from Last 3 Encounters:  05/17/16 129/76  04/30/16 (!) 147/89  02/09/16 132/89   A: BP today well controlled on lisinopril-HCTZ combination. No complaints of lightheadedness, headaches, or blurry vision.  P: - continue current medications  Right hip pain A: Continued right hip pain intermittently relieved with ibuprofen as needed but pain still affecting his daily activities.  He is asking for an workplace accomodation form to be filled out today.  He describes gradually worsening pain for 1 year where he cannot do the tasks that he used to do.  He often has radiating symptoms but denies loss of strength or sensation.  Describes an achy and "twisting" type pain.  It occurs daily, at all times of the day and is located in his right lateral and posterior hip.  He has tried Mobic and physical therapy both of which did not provide adequate relief.  He has not been using Gabapentin because he did not have any refills and did not know to ask for them.  P: - plain films show degenerative changes of both hips, right greater than left. No ffracture or acute finding - form the patient provided did not have a portion to be filled out by physician.  We provided a letter today and suggested attaching his physical therapy notes to accommodation form as well - continue ibuprofen as needed.  Cautioned  on long-term use with BP and kidneys - refilled Gabapentin - discussed potential referral to sports med vs orthopedics for further management and potential injections to which patient was interested. - attempted to contact patient on Friday afternoon with results of imaging and referral plan, but his phone number listed rang without any opportunity to leave voice message. - will place referral to sports medicine and attempt to call him back again on Monday 10/30

## 2016-05-20 NOTE — Assessment & Plan Note (Signed)
A: Continued right hip pain intermittently relieved with ibuprofen as needed but pain still affecting his daily activities.  He is asking for an workplace accomodation form to be filled out today.  He describes gradually worsening pain for 1 year where he cannot do the tasks that he used to do.  He often has radiating symptoms but denies loss of strength or sensation.  Describes an achy and "twisting" type pain.  It occurs daily, at all times of the day and is located in his right lateral and posterior hip.  He has tried Mobic and physical therapy both of which did not provide adequate relief.  He has not been using Gabapentin because he did not have any refills and did not know to ask for them.  P: - plain films show degenerative changes of both hips, right greater than left. No ffracture or acute finding - form the patient provided did not have a portion to be filled out by physician.  We provided a letter today and suggested attaching his physical therapy notes to accommodation form as well - continue ibuprofen as needed.  Cautioned on long-term use with BP and kidneys - refilled Gabapentin - discussed potential referral to sports med vs orthopedics for further management and potential injections to which patient was interested. - attempted to contact patient on Friday afternoon with results of imaging and referral plan, but his phone number listed rang without any opportunity to leave voice message. - will place referral to sports medicine and attempt to call him back again on Monday 10/30

## 2016-05-20 NOTE — Assessment & Plan Note (Signed)
BP Readings from Last 3 Encounters:  05/17/16 129/76  04/30/16 (!) 147/89  02/09/16 132/89   A: BP today well controlled on lisinopril-HCTZ combination. No complaints of lightheadedness, headaches, or blurry vision.  P: - continue current medications

## 2016-05-21 ENCOUNTER — Telehealth: Payer: Self-pay | Admitting: Internal Medicine

## 2016-05-21 NOTE — Telephone Encounter (Signed)
  Reason for call:   I placed an outgoing call to [name] at 12:00 PM regarding his recent hip x-rays and updated him regarding referral to sports medicine.  He expressed understanding that they will contact him for an appointment.  He had no further questions.   Assessment/ Plan:   Referral to sports medicine already placed.  As always, pt is advised that if symptoms worsen or new symptoms arise, they should go contact us and be scheduled for an appointment in [facility] or go to urgent care or ED if severe symptoms are new.   Jule Ser, DO   05/21/2016, 11:59 AM

## 2016-05-21 NOTE — Progress Notes (Signed)
Case discussed with Dr. Wallace soon after the resident saw the patient. We reviewed the resident's history and exam and pertinent patient test results. I agree with the assessment, diagnosis, and plan of care documented in the resident's note. 

## 2016-06-01 ENCOUNTER — Ambulatory Visit (INDEPENDENT_AMBULATORY_CARE_PROVIDER_SITE_OTHER): Payer: Commercial Managed Care - HMO | Admitting: Family Medicine

## 2016-06-01 ENCOUNTER — Encounter: Payer: Self-pay | Admitting: Family Medicine

## 2016-06-01 VITALS — BP 145/84 | HR 73 | Ht 73.0 in | Wt 159.0 lb

## 2016-06-01 DIAGNOSIS — M5441 Lumbago with sciatica, right side: Secondary | ICD-10-CM | POA: Diagnosis not present

## 2016-06-01 DIAGNOSIS — M5416 Radiculopathy, lumbar region: Secondary | ICD-10-CM

## 2016-06-01 DIAGNOSIS — G8929 Other chronic pain: Secondary | ICD-10-CM

## 2016-06-01 NOTE — Progress Notes (Signed)
[facility]: Attending Note: I have reviewed the chart, discussed wit the Sports Medicine Fellow. I agree with assessment and treatment plan as detailed in the [place] note. 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 I suspect his right posterior hip/low back pain is related to overuse of this muscle group to support his pelvis. This is most notable when he enters the swing phase on the left, he  has pelvic dropon the left during that portion and the right side most compensate. He has been through physical therapy and said that did not help much. I don't think we can change the mechanical problems he has with gait but perhaps a TENS unit would be beneficial for some moderation of his pain. He asked about oral pain medicines and was informed we do not prescribe those here. Will set him up for evaluation for TENS unit.  His Hip exam is essentially normal and his hip X Rays show mild degenerative change only.

## 2016-06-01 NOTE — Progress Notes (Signed)
  Jonathan [name] - 60 y.o. male MRN [id number]  Date of birth: [birth year]  SUBJECTIVE:  Including CC & ROS.  CC: right hip pain    Location:  Duration:  Quality:  Severity:  Timing:  Context:  Modifying factors:  Associated signs and symptoms:   ROS: No unexpected weight loss, fever, chills, swelling, instability, muscle pain, numbness/tingling, redness, otherwise see HPI   PMHx - Updated and reviewed.  Contributory factors include: Negative PSHx - Updated and reviewed.  Contributory factors include:  Negative FHx - Updated and reviewed.  Contributory factors include:  Negative Social Hx - Updated and reviewed. Contributory factors include: Negative Medications - reviewed   DATA REVIEWED: Bilateral hip x-rays show right greater than left degenerative changes  PHYSICAL EXAM:  VS: BP:(!) 145/84  HR:73bpm  TEMP: ( )  RESP:   HT:6\' 1"  (185.4 cm)   WT:159 lb (72.1 kg)  BMI:21 PHYSICAL EXAM: Gen: NAD, alert, cooperative with exam, well-appearing HEENT: clear conjunctiva,  CV:  no edema, capillary refill brisk, normal rate Resp: non-labored Skin: no rashes, normal turgor  Neuro: no gross deficits.  Psych:  alert and oriented  Back Exam:  Inspection: Unremarkable  Palpable tenderness: QL on right. Range of Motion:  Flexion 45 deg; Extension 45 deg; Side Bending to 45 deg bilaterally; Rotation to 45 deg bilaterally  Leg strength: Quad: 5/5 Hamstring: 5/5 Hip flexor: 5/5 Hip abductors: 5/5 on right, but on left had 4/5 hamstrings, quads, abductors, adductors Strength at foot: Plantar-flexion: 5/5 Dorsi-flexion: 5/5 Eversion: 5/5 Inversion: 5/5 on right, 3/5 on left in plantar and dorsiflexion Sensory change: Gross sensation intact to all lumbar and sacral dermatomes.  Reflexes: 2+ at right patellar tendon, 2+ at right achilles tendon, 1+ left patellar and achilles tendons.  Gait: walks with antalgic gait, foot drop on left, trendelenburg on left. SLR laying: Negative  XSLR  laying: Negative     ASSESSMENT & PLAN:   No problem-specific Assessment & Plan notes found for this encounter.

## 2016-06-01 NOTE — Assessment & Plan Note (Signed)
Radicular pain, walks with antalgic gait for years.  Will get TENS unit and lumbar x-rays.  Follow up as needed.  May consider advanced imaging.

## 2016-06-07 ENCOUNTER — Other Ambulatory Visit: Payer: Self-pay | Admitting: Family Medicine

## 2016-06-07 ENCOUNTER — Ambulatory Visit
Admission: RE | Admit: 2016-06-07 | Discharge: 2016-06-07 | Disposition: A | Discharge status: Home / Self Care | Payer: Commercial Managed Care - HMO | Source: Ambulatory Visit | Attending: Family Medicine | Admitting: Family Medicine

## 2016-06-07 DIAGNOSIS — G8929 Other chronic pain: Secondary | ICD-10-CM

## 2016-06-07 DIAGNOSIS — M5441 Lumbago with sciatica, right side: Principal | ICD-10-CM

## 2016-06-07 DIAGNOSIS — M5136 Other intervertebral disc degeneration, lumbar region: Secondary | ICD-10-CM | POA: Diagnosis not present

## 2016-06-08 ENCOUNTER — Encounter: Payer: Self-pay | Admitting: Family Medicine

## 2016-06-08 ENCOUNTER — Ambulatory Visit: Payer: Commercial Managed Care - HMO | Admitting: Physical Therapy

## 2016-06-20 ENCOUNTER — Ambulatory Visit: Payer: Commercial Managed Care - HMO | Admitting: Physical Therapy

## 2016-07-05 ENCOUNTER — Other Ambulatory Visit: Payer: Self-pay | Admitting: Internal Medicine

## 2016-07-05 DIAGNOSIS — I10 Essential (primary) hypertension: Secondary | ICD-10-CM

## 2016-07-07 ENCOUNTER — Other Ambulatory Visit: Payer: Self-pay | Admitting: Internal Medicine

## 2016-07-07 DIAGNOSIS — I10 Essential (primary) hypertension: Secondary | ICD-10-CM

## 2016-07-10 ENCOUNTER — Other Ambulatory Visit: Payer: Self-pay | Admitting: Internal Medicine

## 2016-07-10 DIAGNOSIS — I10 Essential (primary) hypertension: Secondary | ICD-10-CM

## 2016-10-29 ENCOUNTER — Other Ambulatory Visit: Payer: Commercial Managed Care - HMO

## 2016-11-01 ENCOUNTER — Encounter: Payer: Commercial Managed Care - HMO | Admitting: Internal Medicine

## 2016-11-15 ENCOUNTER — Encounter: Payer: Commercial Managed Care - HMO | Admitting: Internal Medicine

## 2016-11-29 ENCOUNTER — Encounter: Payer: Self-pay | Admitting: Internal Medicine

## 2016-11-29 ENCOUNTER — Encounter (INDEPENDENT_AMBULATORY_CARE_PROVIDER_SITE_OTHER): Payer: Self-pay

## 2016-11-29 ENCOUNTER — Ambulatory Visit (INDEPENDENT_AMBULATORY_CARE_PROVIDER_SITE_OTHER): Payer: Medicare HMO | Admitting: Internal Medicine

## 2016-11-29 VITALS — BP 153/85 | HR 73 | Temp 98.2°F | Ht 73.0 in | Wt 164.5 lb

## 2016-11-29 DIAGNOSIS — M5416 Radiculopathy, lumbar region: Secondary | ICD-10-CM | POA: Diagnosis not present

## 2016-11-29 DIAGNOSIS — R2689 Other abnormalities of gait and mobility: Secondary | ICD-10-CM | POA: Diagnosis not present

## 2016-11-29 DIAGNOSIS — F17210 Nicotine dependence, cigarettes, uncomplicated: Secondary | ICD-10-CM | POA: Diagnosis not present

## 2016-11-29 DIAGNOSIS — I10 Essential (primary) hypertension: Secondary | ICD-10-CM

## 2016-11-29 DIAGNOSIS — Z79899 Other long term (current) drug therapy: Secondary | ICD-10-CM

## 2016-11-29 MED ORDER — AMLODIPINE BESYLATE 5 MG PO TABS: 5.0000 mg | ORAL_TABLET | Freq: Every day | ORAL | 1 refills | Status: DC

## 2016-11-29 MED ORDER — LIDOCAINE 5 % EX PTCH: 1.0000 | MEDICATED_PATCH | CUTANEOUS | 1 refills | Status: DC

## 2016-11-29 NOTE — Progress Notes (Signed)
CC: here for f/u back pain and HTN  HPI:  Jonathan [name] is a 61 y.o. man with a past medical history listed below here today for follow up of his back pain and HTN.   For details of today's visit and the status of his chronic medical issues please refer to the assessment and plan.   Past Medical History:  Diagnosis Date  . Chronic Bil Foot Pain 05/27/2011   Lt > Rt - since age 10 2/2 to post polio syndrome  Corns and callosities on both feet  Reconstruction surgery at age 44. Uses a brace on left leg for many years On prn tylenol for pain  No candidate for opiates due to active substance abuse Podiatry and PT (for brace eval) on 04/23/2013    . Chronic viral hepatitis C ([place]) 06/05/2006   History of blood transfusion in childhood Dx 2010 Reports history of Rx while in [place], unknown yr Elevated LFTs since 2010 and repeat 2014 U/S 2009 - no cirrhosis, repeat in 12/2013 >> normal.  Fibrosure >> pending results  Referred to Hep C clinic 04/23/2013 >> appt in 06/2013 Hep C clinic [phone] Immunity to hep A per labs at Hep C clinic  Started on Hep B series 1 st dose 02/03/2014, 2 dose given 04/23/2014. Last dose about 09/2014.  Follow with Hep C clinic       . ERECTILE DYSFUNCTION 06/05/2006   Qualifier: Diagnosis of  By: Grandville Silos MD, Quillian Quince    . Essential hypertension 06/05/2006   Dx in 2008  Has BP machine at home  Checks erratically  Chronic no show issues      . Hypertension   . Polysubstance abuse 04/23/2013   Cocaine (sniffs powder) and marijuana No IVDU   . POST-POLIO SYNDROME 06/05/2006   Hx of polio at age 61 Has spasms in his legs on and off Used Flexeril on and off for spasms 2015 May >> sent to rehab/PT. rec new leg braces/ortho      Review of Systems:  Please see pertinent ROS reviewed in HPI and problem based charting.   Physical Exam:  Vitals:   11/29/16 1544  BP: (!) 153/85  Pulse: 73  Temp: 98.2 F (36.8 C)  TempSrc: Oral  SpO2: 100%  Weight: 164 lb 8 oz (74.6  kg)  Height: 6\' 1"  (1.854 m)   Physical Exam  Constitutional: He appears well-developed and well-nourished.  Cardiovascular: Normal rate and regular rhythm.   Pulmonary/Chest: Effort normal.  Musculoskeletal: He exhibits no edema.  Walks with antalgic gait with assistance of cane.  Skin: Skin is warm and dry.  Psychiatric: He has a normal mood and affect. Judgment normal.     Assessment & Plan:   See Encounters Tab for problem based charting.  Patient discussed with Dr. Dareen Piano.  Essential hypertension BP Readings from Last 3 Encounters:  11/29/16 (!) 153/85  06/01/16 (!) 145/84  05/17/16 129/76   Assessment: His BP is elevated today for 2nd consecutive reading.  He is on lisinopril/HCTZ 20/25mg  daily and reports adherence.  He denies chest pain, SOB, lightheadedness, change in vision.  Plan: - will continue lisinopril/HCTZ at current dose - will add amlodipine 5mg  daily - will RTC to see me in 1 month for BP follow up  Lumbar radiculopathy Assessment: Continues to have radicular symptoms.  Is taking ibuprofen and acetaminophen as needed with minimal relief.  Evaluated by SM in Nov 2017.  Lumbar x-rays revealed severe degenerative disc disease with disc  height loss at L3-4 and L4-5. Also, noted grade 1 anterolisthesis of L3 on L4.  Has not followed up since that time.  Reports he did not obtain TENS unit.  Plan: - will try Lidoderm patches for some symptom relief - encouraged follow up with SM to obtain TENS unit and/or advanced imaging.

## 2016-11-29 NOTE — Assessment & Plan Note (Signed)
BP Readings from Last 3 Encounters:  11/29/16 (!) 153/85  06/01/16 (!) 145/84  05/17/16 129/76   Assessment: His BP is elevated today for 2nd consecutive reading.  He is on lisinopril/HCTZ 20/25mg  daily and reports adherence.  He denies chest pain, SOB, lightheadedness, change in vision.  Plan: - will continue lisinopril/HCTZ at current dose - will add amlodipine 5mg  daily - will RTC to see me in 1 month for BP follow up

## 2016-11-29 NOTE — Assessment & Plan Note (Signed)
Assessment: Continues to have radicular symptoms.  Is taking ibuprofen and acetaminophen as needed with minimal relief.  Evaluated by SM in Nov 2017.  Lumbar x-rays revealed severe degenerative disc disease with disc height loss at L3-4 and L4-5. Also, noted grade 1 anterolisthesis of L3 on L4.  Has not followed up since that time.  Reports he did not obtain TENS unit.  Plan: - will try Lidoderm patches for some symptom relief - encouraged follow up with SM to obtain TENS unit and/or advanced imaging.

## 2016-11-29 NOTE — Patient Instructions (Addendum)
Thank you for coming to see me today. It was a pleasure. Today we talked about:   Blood pressure: I have called in a new medication called amlodipine 5mg  daily.  See information below.  Pain: I have ordered Lidocaine patches to your pharmacy.   Please call Sports Medicine for a follow up appointment.  Their phone number is [phone]  Please call the Hepatitis C clinic to see what further follow up is needed.  Their phone number is [phone]     Please follow-up with me in 1 month for blood pressure follow up.  If you have any questions or concerns, please do not hesitate to call the office at [phone].  Take Care,   Jule Ser, DO Amlodipine tablets What is this medicine? AMLODIPINE (am LOE di peen) is a calcium-channel blocker. It affects the amount of calcium found in your heart and muscle cells. This relaxes your blood vessels, which can reduce the amount of work the heart has to do. This medicine is used to lower high blood pressure. It is also used to prevent chest pain. This medicine may be used for other purposes; ask your health care provider or pharmacist if you have questions. COMMON BRAND NAME(S): Norvasc What should I tell my health care provider before I take this medicine? They need to know if you have any of these conditions: -heart problems like heart failure or aortic stenosis -liver disease -an unusual or allergic reaction to amlodipine, other medicines, foods, dyes, or preservatives -pregnant or trying to get pregnant -breast-feeding How should I use this medicine? Take this medicine by mouth with a glass of water. Follow the directions on the prescription label. Take your medicine at regular intervals. Do not take more medicine than directed. Talk to your pediatrician regarding the use of this medicine in children. Special care may be needed. This medicine has been used in children as young as 6. Persons over 37 years old may have a stronger  reaction to this medicine and need smaller doses. Overdosage: If you think you have taken too much of this medicine contact a poison control center or emergency room at once. NOTE: This medicine is only for you. Do not share this medicine with others. What if I miss a dose? If you miss a dose, take it as soon as you can. If it is almost time for your next dose, take only that dose. Do not take double or extra doses. What may interact with this medicine? -herbal or dietary supplements -local or general anesthetics -medicines for high blood pressure -medicines for prostate problems -rifampin This list may not describe all possible interactions. Give your health care provider a list of all the medicines, herbs, non-prescription drugs, or dietary supplements you use. Also tell them if you smoke, drink alcohol, or use illegal drugs. Some items may interact with your medicine. What should I watch for while using this medicine? Visit your doctor or health care professional for regular check ups. Check your blood pressure and pulse rate regularly. Ask your health care professional what your blood pressure and pulse rate should be, and when you should contact him or her. This medicine may make you feel confused, dizzy or lightheaded. Do not drive, use machinery, or do anything that needs mental alertness until you know how this medicine affects you. To reduce the risk of dizzy or fainting spells, do not sit or stand up quickly, especially if you are an older patient. Avoid alcoholic drinks; they  can make you more dizzy. Do not suddenly stop taking amlodipine. Ask your doctor or health care professional how you can gradually reduce the dose. What side effects may I notice from receiving this medicine? Side effects that you should report to your doctor or health care professional as soon as possible: -allergic reactions like skin rash, itching or hives, swelling of the face, lips, or tongue -breathing  problems -changes in vision or hearing -chest pain -fast, irregular heartbeat -swelling of legs or ankles Side effects that usually do not require medical attention (report to your doctor or health care professional if they continue or are bothersome): -dry mouth -facial flushing -nausea, vomiting -stomach gas, pain -tired, weak -trouble sleeping This list may not describe all possible side effects. Call your doctor for medical advice about side effects. You may report side effects to FDA at 1-800-FDA-1088. Where should I keep my medicine? Keep out of the reach of children. Store at room temperature between 59 and 86 degrees F (15 and 30 degrees C). Protect from light. Keep container tightly closed. Throw away any unused medicine after the expiration date. NOTE: This sheet is a summary. It may not cover all possible information. If you have questions about this medicine, talk to your doctor, pharmacist, or health care provider.  2018 Elsevier/Gold Standard (2012-06-06 11:40:58)

## 2016-11-30 NOTE — Progress Notes (Signed)
Internal Medicine Clinic Attending  Case discussed with Dr. Wallace at the time of the visit.  We reviewed the resident's history and exam and pertinent patient test results.  I agree with the assessment, diagnosis, and plan of care documented in the resident's note.  

## 2017-01-03 ENCOUNTER — Other Ambulatory Visit: Payer: Self-pay | Admitting: Internal Medicine

## 2017-01-03 ENCOUNTER — Ambulatory Visit (INDEPENDENT_AMBULATORY_CARE_PROVIDER_SITE_OTHER): Payer: Medicare HMO | Admitting: Internal Medicine

## 2017-01-03 ENCOUNTER — Encounter: Payer: Self-pay | Admitting: Internal Medicine

## 2017-01-03 DIAGNOSIS — Z79899 Other long term (current) drug therapy: Secondary | ICD-10-CM

## 2017-01-03 DIAGNOSIS — F17210 Nicotine dependence, cigarettes, uncomplicated: Secondary | ICD-10-CM

## 2017-01-03 DIAGNOSIS — I10 Essential (primary) hypertension: Secondary | ICD-10-CM

## 2017-01-03 DIAGNOSIS — Z791 Long term (current) use of non-steroidal anti-inflammatories (NSAID): Secondary | ICD-10-CM | POA: Diagnosis not present

## 2017-01-03 DIAGNOSIS — M5416 Radiculopathy, lumbar region: Secondary | ICD-10-CM | POA: Diagnosis not present

## 2017-01-03 MED ORDER — DICLOFENAC SODIUM 1 % TD GEL: 2.0000 g | Freq: Four times a day (QID) | TRANSDERMAL | 2 refills | Status: DC

## 2017-01-03 MED ORDER — AMLODIPINE BESYLATE 10 MG PO TABS: 10.0000 mg | ORAL_TABLET | Freq: Every day | ORAL | 1 refills | Status: DC

## 2017-01-03 MED ORDER — MELOXICAM 7.5 MG PO TABS: 7.5000 mg | ORAL_TABLET | Freq: Every day | ORAL | 2 refills | Status: AC

## 2017-01-03 NOTE — Assessment & Plan Note (Signed)
BP Readings from Last 3 Encounters:  01/03/17 (!) 145/99  11/29/16 (!) 153/85  06/01/16 (!) 145/84   Assessment: BP remains above goal after adding amlodipine 5mg  daily.  Has been taking but occasionally misses evening dose.  Denies any issue affording this medication.  Denies any CP, HA, blurred vision.  Plan: - continue lisinopril-HCTZ 20-25mg  daily - increase amlodipine to 10mg  daily - RTC 1-2 months for BP follow up.

## 2017-01-03 NOTE — Progress Notes (Signed)
CC: here for HTN and back pain follow up  HPI:  [name] is a 61 y.o. man with a past medical history listed below here today for follow up of his HTN and back pain.   For details of today's visit and the status of his chronic medical issues please refer to the assessment and plan.   Past Medical History:  Diagnosis Date  . Chronic Bil Foot Pain 05/27/2011   Lt > Rt - since age 40 2/2 to post polio syndrome  Corns and callosities on both feet  Reconstruction surgery at age 64. Uses a brace on left leg for many years On prn tylenol for pain  No candidate for opiates due to active substance abuse Podiatry and PT (for brace eval) on 04/23/2013    . Chronic viral hepatitis C ([place]) 06/05/2006   History of blood transfusion in childhood Dx 2010 Reports history of Rx while in [place], unknown yr Elevated LFTs since 2010 and repeat 2014 U/S 2009 - no cirrhosis, repeat in 12/2013 >> normal.  Fibrosure >> pending results  Referred to Hep C clinic 04/23/2013 >> appt in 06/2013 Hep C clinic [phone] Immunity to hep A per labs at Hep C clinic  Started on Hep B series 1 st dose 02/03/2014, 2 dose given 04/23/2014. Last dose about 09/2014.  Follow with Hep C clinic       . ERECTILE DYSFUNCTION 06/05/2006   Qualifier: Diagnosis of  By: Grandville Silos MD, Quillian Quince    . Essential hypertension 06/05/2006   Dx in 2008  Has BP machine at home  Checks erratically  Chronic no show issues      . Hypertension   . Polysubstance abuse 04/23/2013   Cocaine (sniffs powder) and marijuana No IVDU   . POST-POLIO SYNDROME 06/05/2006   Hx of polio at age 30 Has spasms in his legs on and off Used Flexeril on and off for spasms 2015 May >> sent to rehab/PT. rec new leg braces/ortho      Review of Systems:  Please see pertinent ROS reviewed in HPI and problem based charting.   Physical Exam:  Vitals:   01/03/17 1601  BP: (!) 145/99  Pulse: 69  Temp: 98 F (36.7 C)  TempSrc: Oral  SpO2: 100%  Weight: 163 lb (73.9  kg)  Height: 6\' 1"  (1.854 m)   General: sitting on exam table, pleasant, NAD HEENT: [place]/AT, EOMI, no scleral icterus Cardiac: RRR Pulm: clear to auscultation bilaterally Ext: antalgic gait, uses cane Neuro: alert and oriented X3, cranial nerves II-XII grossly intact   Assessment & Plan:   See Encounters Tab for problem based charting.  Patient discussed with Dr. Dareen Piano.  Essential hypertension BP Readings from Last 3 Encounters:  01/03/17 (!) 145/99  11/29/16 (!) 153/85  06/01/16 (!) 145/84   Assessment: BP remains above goal after adding amlodipine 5mg  daily.  Has been taking but occasionally misses evening dose.  Denies any issue affording this medication.  Denies any CP, HA, blurred vision.  Plan: - continue lisinopril-HCTZ 20-25mg  daily - increase amlodipine to 10mg  daily - RTC 1-2 months for BP follow up.  Lumbar radiculopathy Assessment: Since our last visit, he still has pain with radicular symptoms.  Has not contacted SM for follow up since his visit in November.  Has not obtained TENS unit.  He wonders if aqua therapy would be beneficial.  He was unable to afford Lidoderm patches.  He contineus to use ibuprofen with minimal benefit.  Takes  gabapentin as needed QHS.  Plan: - d/c ibuprofen - start meloxicam 7.5mg  daily, consider increasing to 15mg  depending on response.  He has been on this previously and seemed to help from his recall - start voltaren gel as needed - encouraged follow up with SM to obtain TENS units and/or advanced imaging.

## 2017-01-03 NOTE — Assessment & Plan Note (Signed)
Assessment: Since our last visit, he still has pain with radicular symptoms.  Has not contacted SM for follow up since his visit in November.  Has not obtained TENS unit.  He wonders if aqua therapy would be beneficial.  He was unable to afford Lidoderm patches.  He contineus to use ibuprofen with minimal benefit.  Takes gabapentin as needed QHS.  Plan: - d/c ibuprofen - start meloxicam 7.5mg  daily, consider increasing to 15mg  depending on response.  He has been on this previously and seemed to help from his recall - start voltaren gel as needed - encouraged follow up with SM to obtain TENS units and/or advanced imaging.

## 2017-01-03 NOTE — Patient Instructions (Signed)
Thank you for coming to see me today. It was a pleasure. Today we talked about:   Back Pain: - STOP Ibuprofen -START Mobic - Continue gabapentin at bedtime as needed - Call sports medicine for a follow up appointment and possible TENS unit  Blood Pressure: - we have increased your Amlodipine to 10mg  daily - continue your other medication as prescribed  Please follow-up with me in 3 months.  If you have any questions or concerns, please do not hesitate to call the office at [phone].  Take Care,   Jule Ser, DO

## 2017-01-16 NOTE — Progress Notes (Signed)
Internal Medicine Clinic Attending  Case discussed with Dr. Wallace at the time of the visit.  We reviewed the resident's history and exam and pertinent patient test results.  I agree with the assessment, diagnosis, and plan of care documented in the resident's note.  

## 2017-02-01 ENCOUNTER — Ambulatory Visit (INDEPENDENT_AMBULATORY_CARE_PROVIDER_SITE_OTHER): Payer: Medicare HMO | Admitting: Family Medicine

## 2017-02-01 ENCOUNTER — Encounter: Payer: Self-pay | Admitting: Family Medicine

## 2017-02-01 VITALS — BP 118/80 | Ht 73.0 in | Wt 164.0 lb

## 2017-02-01 DIAGNOSIS — M5416 Radiculopathy, lumbar region: Secondary | ICD-10-CM | POA: Diagnosis not present

## 2017-02-01 NOTE — Progress Notes (Signed)
[place] Attending Note: I have seen and examined this patient. I have discussed this patient with the resident and reviewed the assessment and plan as documented above. I agree with the resident's findings and plan.  #1. Right lower extremity radicular pain: Post polio syndrome with residual left lower extremity weakness 3 out of 5 compared with 5 out of 5 on the right. Upper extremity is much more symmetrical. I think this places a lot of extra work on his right lower extremity. Evaluation of x-ray from several years ago shows a 6 mm anterolisthesis and compression of the disc space at L3-L4. We'll get MRI to evaluate obvious disc or nerve root pathology. Does all negative, then I think he will feel reassured and we can move forward with accommodative physical therapy given his gait abnormalities.

## 2017-02-01 NOTE — Patient Instructions (Signed)
Lets get the MRI and see you back in clinic here in 2-3 weeks

## 2017-02-01 NOTE — Progress Notes (Signed)
     Subjective:    Patient ID: Jonathan ARO , male   DOB: [birth year] , 61 y.o..   MRN: [id number]  HPI  CUYLER VANDYKEN is here for:  1. Right Hip/Leg pain: Patient notes that for  "years "he has had right hip pain and right lateral buttock and groin pain. Additionally he has had paresthesias of his right leg. He notes that he was diagnosed with polio as a child and since then has left upper and lower extremity weakness. He has been compensating with putting more pressure on his right side. He walks with a cane. He was put on a new medication by his PCP but doesn't know what it is. He notes that this medicine is helping him. Denies any back pain. Patient is concerned that there is something going on" inside my body "and he wants to get a scan to see what is going on.   Review of Systems: Per HPI. All other systems reviewed and are negative.  Past Medical History: Patient Active Problem List   Diagnosis Date Noted  . Liver fibrosis 04/30/2016  . Peripheral neuropathy 02/09/2016  . Lumbar radiculopathy 12/16/2015  . Right hip pain 12/16/2015  . Polysubstance abuse 04/23/2013  . Preventive measure 05/27/2011  . Chronic Bil Foot Pain 05/27/2011  . Elevated Liver Enzymes 10/13/2008  . Smoking 07/26/2008  . Chronic viral hepatitis C ([place]) 06/05/2006  . POST-POLIO SYNDROME 06/05/2006  . Essential hypertension 06/05/2006    Medications: reviewed and updated Current Outpatient Prescriptions  Medication Sig Dispense Refill  . amLODipine (NORVASC) 10 MG tablet Take 1 tablet (10 mg total) by mouth daily. 30 tablet 1  . diclofenac sodium (VOLTAREN) 1 % GEL Apply 2 g topically 4 (four) times daily. 100 g 2  . gabapentin (NEURONTIN) 300 MG capsule Take 1 capsule (300 mg total) by mouth at bedtime. 30 capsule 2  . lisinopril-hydrochlorothiazide (PRINZIDE,ZESTORETIC) 20-25 MG tablet TAKE ONE TABLET BY MOUTH ONCE DAILY 30 tablet 5  . meloxicam (MOBIC) 7.5 MG tablet Take 1 tablet (7.5 mg  total) by mouth daily. 30 tablet 2   No current facility-administered medications for this visit.     Social Hx:  reports that he has been smoking Cigarettes.  He has a 17.50 pack-year smoking history. He has never used smokeless tobacco.   Objective:   BP 118/80   Ht 6\' 1"  (1.854 m)   Wt 164 lb (74.4 kg)   BMI 21.64 kg/m  Physical Exam  Gen: NAD, alert, cooperative with exam, well-appearing Right Hip: ROM IR: 3 Deg, ER: 45 Deg, Flexion: 120 Deg, Extension: 100 Deg, Abduction: 45 Deg, Adduction: 45 Deg Strength IR: 5/5, ER: 5/5, Flexion: 5/5, Extension: 5/5, Abduction: 5/5, Adduction: 5/5 Pelvic alignment unremarkable to inspection and palpation. Standing hip rotation and gait without trendelenburg sign / unsteadiness. Greater trochanter without tenderness to palpation. No tenderness over piriformis. Pain with FABER and FADIR. No SI joint tenderness and normal minimal SI movement.  Assessment & Plan:  MRI LS-spine follow-up  Smitty Cords, MD [place], PGY-3

## 2017-02-10 ENCOUNTER — Other Ambulatory Visit: Payer: Medicare HMO

## 2017-02-11 ENCOUNTER — Telehealth: Payer: Self-pay | Admitting: Family Medicine

## 2017-02-11 ENCOUNTER — Ambulatory Visit
Admission: RE | Admit: 2017-02-11 | Discharge: 2017-02-11 | Disposition: A | Discharge status: Home / Self Care | Payer: Medicare HMO | Source: Ambulatory Visit | Attending: Family Medicine | Admitting: Family Medicine

## 2017-02-11 ENCOUNTER — Encounter: Payer: Self-pay | Admitting: Family Medicine

## 2017-02-11 DIAGNOSIS — M48061 Spinal stenosis, lumbar region without neurogenic claudication: Secondary | ICD-10-CM | POA: Diagnosis not present

## 2017-02-11 DIAGNOSIS — M5416 Radiculopathy, lumbar region: Secondary | ICD-10-CM

## 2017-02-11 DIAGNOSIS — M5126 Other intervertebral disc displacement, lumbar region: Secondary | ICD-10-CM | POA: Diagnosis not present

## 2017-02-11 NOTE — Telephone Encounter (Signed)
[place] His MRI shows SEVERE ;imbar spinal stenosis as well as severe right sided nerve root encroachment. He has post polio syndrome on the LEFT so his RIGHT leg is the only truly functional leg---I would like him to see neurosurgery for an evaluation. I would want him to be evaluated now as he is likely to need some back surgery at some point (soon). Please let him know and get him in with [facility]. I will route a referral letter to you  To send with his last OV note and his MRI report. THANKS! Dorcas Mcmurray

## 2017-02-15 ENCOUNTER — Ambulatory Visit: Payer: Medicare HMO | Admitting: Family Medicine

## 2017-06-06 ENCOUNTER — Other Ambulatory Visit: Payer: Self-pay | Admitting: *Deleted

## 2017-06-06 DIAGNOSIS — I10 Essential (primary) hypertension: Secondary | ICD-10-CM

## 2017-06-06 MED ORDER — LISINOPRIL-HYDROCHLOROTHIAZIDE 20-25 MG PO TABS: 1.0000 | ORAL_TABLET | Freq: Every day | ORAL | 5 refills | Status: DC

## 2017-07-11 ENCOUNTER — Encounter: Payer: Medicare HMO | Admitting: Internal Medicine

## 2017-08-12 ENCOUNTER — Ambulatory Visit (HOSPITAL_COMMUNITY)
Admission: EM | Admit: 2017-08-12 | Discharge: 2017-08-12 | Disposition: A | Discharge status: Home / Self Care | Payer: Medicare HMO | Attending: Family Medicine | Admitting: Family Medicine

## 2017-08-12 ENCOUNTER — Other Ambulatory Visit: Payer: Self-pay

## 2017-08-12 ENCOUNTER — Encounter (HOSPITAL_COMMUNITY): Payer: Self-pay | Admitting: Emergency Medicine

## 2017-08-12 DIAGNOSIS — J111 Influenza due to unidentified influenza virus with other respiratory manifestations: Secondary | ICD-10-CM

## 2017-08-12 DIAGNOSIS — R69 Illness, unspecified: Secondary | ICD-10-CM

## 2017-08-12 MED ORDER — OSELTAMIVIR PHOSPHATE 75 MG PO CAPS: 75.0000 mg | ORAL_CAPSULE | Freq: Two times a day (BID) | ORAL | 0 refills | Status: DC

## 2017-08-12 MED ORDER — ACETAMINOPHEN 325 MG PO TABS
ORAL_TABLET | ORAL | Status: AC
  Filled 2017-08-12: qty 2

## 2017-08-12 MED ORDER — HYDROCODONE-HOMATROPINE 5-1.5 MG/5ML PO SYRP: 5.0000 mL | ORAL_SOLUTION | Freq: Four times a day (QID) | ORAL | 0 refills | Status: DC | PRN

## 2017-08-12 MED ORDER — ACETAMINOPHEN 325 MG PO TABS
650.0000 mg | ORAL_TABLET | Freq: Once | ORAL | Status: AC
  Administered 2017-08-12: 650 mg via ORAL

## 2017-08-12 NOTE — Discharge Instructions (Signed)

## 2017-08-12 NOTE — ED Triage Notes (Signed)
Pt c/o flu like symptoms, fever, body aches, HA. Since Saturday.

## 2017-08-12 NOTE — ED Provider Notes (Signed)
[facility]   [id number] 08/12/17 Arrival Time: 1740  ASSESSMENT & PLAN:  1. Influenza-like illness     Meds ordered this encounter  Medications  . acetaminophen (TYLENOL) tablet 650 mg  . HYDROcodone-homatropine (HYCODAN) 5-1.5 MG/5ML syrup    Sig: Take 5 mLs by mouth every 6 (six) hours as needed for cough.    Dispense:  90 mL    Refill:  0  . oseltamivir (TAMIFLU) 75 MG capsule    Sig: Take 1 capsule (75 mg total) by mouth every 12 (twelve) hours.    Dispense:  10 capsule    Refill:  0    Cough medication sedation precautions. Discussed typical duration of symptoms. OTC symptom care as needed. Ensure adequate fluid intake and rest. May f/u with PCP or here as needed.  Reviewed expectations re: course of current medical issues. Questions answered. Outlined signs and symptoms indicating need for more acute intervention. Patient verbalized understanding. After Visit Summary given.   SUBJECTIVE: History from: patient.  Jonathan [name] is a 62 y.o. male who presents with complaint of nasal congestion, post-nasal drainage, and a persistent dry cough. Onset abrupt, approximately 2 days ago. Overall fatigued with body aches. SOB: none. Wheezing: none. Fever: yes, subjective. Overall normal PO intake without n/v. Sick contacts: yes, coworkers. OTC treatment: none.  Received flu shot this year: no.  Social History   Tobacco Use  Smoking Status Current Every Day Smoker  . Packs/day: 0.50  . Years: 35.00  . Pack years: 17.50  . Types: Cigarettes  Smokeless Tobacco Never Used    ROS: As per HPI.   OBJECTIVE:  Vitals:   08/12/17 1354  BP: (!) 158/102  Pulse: 94  Resp: 20  Temp: (!) 101 F (38.3 C)  SpO2: 100%     General appearance: alert; appears fatigued HEENT: nasal congestion; clear runny nose; throat irritation secondary to post-nasal drainage Neck: supple without LAD Lungs: unlabored respirations, symmetrical air entry; cough: moderate;  no respiratory distress Skin: warm and dry Psychological: alert and cooperative; normal mood and affect   No Known Allergies  Past Medical History:  Diagnosis Date  . Chronic Bil Foot Pain 05/27/2011   Lt > Rt - since age 60 2/2 to post polio syndrome  Corns and callosities on both feet  Reconstruction surgery at age 48. Uses a brace on left leg for many years On prn tylenol for pain  No candidate for opiates due to active substance abuse Podiatry and PT (for brace eval) on 04/23/2013    . Chronic viral hepatitis C ([place]) 06/05/2006   History of blood transfusion in childhood Dx 2010 Reports history of Rx while in [place], unknown yr Elevated LFTs since 2010 and repeat 2014 U/S 2009 - no cirrhosis, repeat in 12/2013 >> normal.  Fibrosure >> pending results  Referred to Hep C clinic 04/23/2013 >> appt in 06/2013 Hep C clinic [phone] Immunity to hep A per labs at Hep C clinic  Started on Hep B series 1 st dose 02/03/2014, 2 dose given 04/23/2014. Last dose about 09/2014.  Follow with Hep C clinic       . ERECTILE DYSFUNCTION 06/05/2006   Qualifier: Diagnosis of  By: Grandville Silos MD, Quillian Quince    . Essential hypertension 06/05/2006   Dx in 2008  Has BP machine at home  Checks erratically  Chronic no show issues      . Hypertension   . Polysubstance abuse ([place]) 04/23/2013   Cocaine (sniffs powder) and marijuana  No IVDU   . POST-POLIO SYNDROME 06/05/2006   Hx of polio at age 44 Has spasms in his legs on and off Used Flexeril on and off for spasms 2015 May >> sent to rehab/PT. rec new leg braces/ortho     Family History  Problem Relation Age of Onset  . Cancer Mother    Social History   Socioeconomic History  . Marital status: Single    Spouse name: Not on file  . Number of children: Not on file  . Years of education: Not on file  . Highest education level: Not on file  Social Needs  . Financial resource strain: Not on file  . Food insecurity - worry: Not on file  . Food insecurity - inability:  Not on file  . Transportation needs - medical: Not on file  . Transportation needs - non-medical: Not on file  Occupational History  . Not on file  Tobacco Use  . Smoking status: Current Every Day Smoker    Packs/day: 0.50    Years: 35.00    Pack years: 17.50    Types: Cigarettes  . Smokeless tobacco: Never Used  Substance and Sexual Activity  . Alcohol use: Yes    Alcohol/week: 12.6 oz    Types: 21 Standard drinks or equivalent per week    Comment: beer everyday  . Drug use: Yes    Types: Marijuana    Comment: marijuana  . Sexual activity: Not Currently  Other Topics Concern  . Not on file  Social History Narrative  . Not on file           Vanessa Kick, MD 08/12/17 1451

## 2017-08-29 ENCOUNTER — Encounter: Payer: Self-pay | Admitting: Internal Medicine

## 2017-08-29 ENCOUNTER — Ambulatory Visit (INDEPENDENT_AMBULATORY_CARE_PROVIDER_SITE_OTHER): Payer: Medicare HMO | Admitting: Internal Medicine

## 2017-08-29 ENCOUNTER — Other Ambulatory Visit: Payer: Self-pay

## 2017-08-29 VITALS — BP 158/98 | HR 67 | Temp 97.7°F | Ht 73.0 in | Wt 164.0 lb

## 2017-08-29 DIAGNOSIS — I10 Essential (primary) hypertension: Secondary | ICD-10-CM | POA: Diagnosis not present

## 2017-08-29 DIAGNOSIS — F17210 Nicotine dependence, cigarettes, uncomplicated: Secondary | ICD-10-CM | POA: Diagnosis not present

## 2017-08-29 DIAGNOSIS — M48061 Spinal stenosis, lumbar region without neurogenic claudication: Secondary | ICD-10-CM

## 2017-08-29 DIAGNOSIS — Z299 Encounter for prophylactic measures, unspecified: Secondary | ICD-10-CM

## 2017-08-29 LAB — GLUCOSE, CAPILLARY: Glucose-Capillary: 78 mg/dL (ref 65–99)

## 2017-08-29 LAB — POCT GLYCOSYLATED HEMOGLOBIN (HGB A1C): HEMOGLOBIN A1C: 5

## 2017-08-29 MED ORDER — AMLODIPINE BESYLATE 10 MG PO TABS: 10.0000 mg | ORAL_TABLET | Freq: Every day | ORAL | 1 refills | Status: DC

## 2017-08-29 NOTE — Assessment & Plan Note (Signed)
Assessment: MRI this summer 2018 showed severe spinal stenosis at L3-L4 with right sided nerve encroachment.  He reports being seen by Neurosurgery, however, is uninterested in surgical intervention at this time.  He takes Gabapentin and Meloxicam PRN for pain control.  Plan: - continue current management

## 2017-08-29 NOTE — Patient Instructions (Signed)
Thank you for coming to see me today. It was a pleasure. Today we talked about:   Blood Presure: Please continue to take your Lisinopril-HCTZ combination pill as prescribed. I have also refilled your Amlodipine 10mg  tablet to take.  Colon Cancer screening: I have put in a referral to have this done.  They will call you to schedule an appointment.  Please go to the lab.  I will update you if anything is abnormal.  Please follow-up with me in about 2 months for your blood pressure  If you have any questions or concerns, please do not hesitate to call the office at [phone].  Take Care,   Jule Ser, DO

## 2017-08-29 NOTE — Progress Notes (Signed)
CC: here for f/u of HTN and right leg & hip pain  HPI:  [name] is a 62 y.o. man with a past medical history listed below here today for follow up of his HTN and right hip and leg pain.  He was seen recently in the ED and treated for influenza.  He is doing better from this illness.   For details of today's visit and the status of his chronic medical issues please refer to the assessment and plan.   Past Medical History:  Diagnosis Date  . Chronic Bil Foot Pain 05/27/2011   Lt > Rt - since age 27 2/2 to post polio syndrome  Corns and callosities on both feet  Reconstruction surgery at age 66. Uses a brace on left leg for many years On prn tylenol for pain  No candidate for opiates due to active substance abuse Podiatry and PT (for brace eval) on 04/23/2013    . Chronic viral hepatitis C ([place]) 06/05/2006   History of blood transfusion in childhood Dx 2010 Reports history of Rx while in [place], unknown yr Elevated LFTs since 2010 and repeat 2014 U/S 2009 - no cirrhosis, repeat in 12/2013 >> normal.  Fibrosure >> pending results  Referred to Hep C clinic 04/23/2013 >> appt in 06/2013 Hep C clinic [phone] Immunity to hep A per labs at Hep C clinic  Started on Hep B series 1 st dose 02/03/2014, 2 dose given 04/23/2014. Last dose about 09/2014.  Follow with Hep C clinic       . ERECTILE DYSFUNCTION 06/05/2006   Qualifier: Diagnosis of  By: Grandville Silos MD, Quillian Quince    . Essential hypertension 06/05/2006   Dx in 2008  Has BP machine at home  Checks erratically  Chronic no show issues      . Hypertension   . Polysubstance abuse ([place]) 04/23/2013   Cocaine (sniffs powder) and marijuana No IVDU   . POST-POLIO SYNDROME 06/05/2006   Hx of polio at age 32 Has spasms in his legs on and off Used Flexeril on and off for spasms 2015 May >> sent to rehab/PT. rec new leg braces/ortho     Review of Systems:  Please see pertinent ROS reviewed in HPI and problem based charting.   Physical Exam:  Vitals:     08/29/17 1418  BP: (!) 158/98  Pulse: 67  Temp: 97.7 F (36.5 C)  TempSrc: Oral  SpO2: 100%  Weight: 164 lb (74.4 kg)  Height: 6\' 1"  (1.854 m)   General: sitting up, pleasant, NAD HEENT: NCAT, EOMI, no scleral icterus Cardiac: RRR Pulm: clear to auscultation bilaterally, moving normal volumes of air Ext: antalgic gait, ambulating with cane Neuro: alert and oriented X3, cranial nerves II-XII grossly intact   Assessment & Plan:   See Encounters Tab for problem based charting.  Patient discussed with Dr. Evette Doffing .  Essential hypertension BP Readings from Last 3 Encounters:  08/29/17 (!) 158/98  08/12/17 (!) 158/102  02/01/17 118/80   Assessment: BP is not well controlled today.  He has only been taking his Lisinopril/HCTZ combination and not his amlodipine.  Denies any CP, SOB, headaches.  Plan: - Continue Lisinopril-HCTZ 20-25mg  combination - Refilled Amlodipine 10mg  daily - RTC 2 months.  If not at goal, consider titrating up Lisinopril-HCTZ combination versus switching therapy to something like chlorthalidone - Check CMET, lipids, A1c today to assess ASCVD risk  Smoking Assessment: He is smoking about 1/2 pack per day and has contemplated quitting but  is not interested in doing so today.  We discussed the overall long-term health benefits of quitting smoking.  Plan: - Continue to address at follow up visits  Spinal stenosis, lumbar Assessment: MRI this summer 2018 showed severe spinal stenosis at L3-L4 with right sided nerve encroachment.  He reports being seen by Neurosurgery, however, is uninterested in surgical intervention at this time.  He takes Gabapentin and Meloxicam PRN for pain control.  Plan: - continue current management  Preventive measure Assessment: Patient inquires today regarding colon cancer screening.  Per review of chart, he had normal colonoscopy with Dr Ardis Hughs in 2009 with recommended repeat in 10 years.  He does not have any concerning  symptoms today such as melena, BRBPR, abdominal pain.  Plan: - GI referral for screening colonoscopy

## 2017-08-29 NOTE — Assessment & Plan Note (Signed)
Assessment: Patient inquires today regarding colon cancer screening.  Per review of chart, he had normal colonoscopy with Dr Ardis Hughs in 2009 with recommended repeat in 10 years.  He does not have any concerning symptoms today such as melena, BRBPR, abdominal pain.  Plan: - GI referral for screening colonoscopy

## 2017-08-29 NOTE — Assessment & Plan Note (Signed)
BP Readings from Last 3 Encounters:  08/29/17 (!) 158/98  08/12/17 (!) 158/102  02/01/17 118/80   Assessment: BP is not well controlled today.  He has only been taking his Lisinopril/HCTZ combination and not his amlodipine.  Denies any CP, SOB, headaches.  Plan: - Continue Lisinopril-HCTZ 20-25mg  combination - Refilled Amlodipine 10mg  daily - RTC 2 months.  If not at goal, consider titrating up Lisinopril-HCTZ combination versus switching therapy to something like chlorthalidone - Check CMET, lipids, A1c today to assess ASCVD risk

## 2017-08-29 NOTE — Assessment & Plan Note (Signed)
Assessment: He is smoking about 1/2 pack per day and has contemplated quitting but is not interested in doing so today.  We discussed the overall long-term health benefits of quitting smoking.  Plan: - Continue to address at follow up visits

## 2017-08-30 LAB — LIPID PANEL
CHOLESTEROL TOTAL: 151 mg/dL (ref 100–199)
Chol/HDL Ratio: 2.4 ratio (ref 0.0–5.0)
HDL: 64 mg/dL (ref >39)
LDL Calculated: 70 mg/dL (ref 0–99)
Triglycerides: 85 mg/dL (ref 0–149)
VLDL CHOLESTEROL CAL: 17 mg/dL (ref 5–40)

## 2017-08-30 LAB — CMP14 + ANION GAP
ALK PHOS: 74 IU/L (ref 39–117)
ALT: 13 IU/L (ref 0–44)
AST: 19 IU/L (ref 0–40)
Albumin/Globulin Ratio: 1.8 (ref 1.2–2.2)
Albumin: 4.5 g/dL (ref 3.5–5.5)
Anion Gap: 15 mmol/L (ref 10.0–18.0)
BILIRUBIN TOTAL: 0.6 mg/dL (ref 0.0–1.2)
BUN / CREAT RATIO: 13 (ref 9–20)
BUN: 18 mg/dL (ref 6–24)
CHLORIDE: 104 mmol/L (ref 96–106)
CO2: 22 mmol/L (ref 20–29)
Calcium: 9.6 mg/dL (ref 8.7–10.2)
Creatinine, Ser: 1.35 mg/dL — ABNORMAL HIGH (ref 0.76–1.27)
GFR calc Af Amer: 66 mL/min/{1.73_m2} (ref >59)
GFR calc non Af Amer: 57 mL/min/{1.73_m2} — ABNORMAL LOW (ref >59)
GLUCOSE: 72 mg/dL (ref 65–99)
Globulin, Total: 2.5 g/dL (ref 1.5–4.5)
Potassium: 4.2 mmol/L (ref 3.5–5.2)
Sodium: 141 mmol/L (ref 134–144)
Total Protein: 7 g/dL (ref 6.0–8.5)

## 2017-08-30 NOTE — Progress Notes (Signed)
Internal Medicine Clinic Attending  Case discussed with Dr. Wallace at the time of the visit.  We reviewed the resident's history and exam and pertinent patient test results.  I agree with the assessment, diagnosis, and plan of care documented in the resident's note.  

## 2017-09-02 ENCOUNTER — Other Ambulatory Visit: Payer: Self-pay | Admitting: Internal Medicine

## 2017-09-02 MED ORDER — ATORVASTATIN CALCIUM 40 MG PO TABS: 40.0000 mg | ORAL_TABLET | Freq: Every day | ORAL | 11 refills | Status: DC

## 2017-09-02 MED ORDER — ATORVASTATIN CALCIUM 20 MG PO TABS: 20.0000 mg | ORAL_TABLET | Freq: Every day | ORAL | 11 refills | Status: DC

## 2017-09-27 ENCOUNTER — Telehealth: Payer: Self-pay | Admitting: Internal Medicine

## 2017-09-27 NOTE — Telephone Encounter (Signed)
Patient is trying to get his cholest medicine Dr Juleen China wrote two prescription with two different mg and the pharmacy will not give him the medicine until the dr verify.  Patient is requesting that this be done today, patient been out of medicine for a while now.

## 2017-09-27 NOTE — Telephone Encounter (Signed)
Called pharm, spoke to pharmacist, he states yes, they should have cancelled the 40mg  as in message sent by dr Juleen China, they will do so now and 20mg  will be ready in appr 43mins, called and informed pt

## 2017-11-13 ENCOUNTER — Other Ambulatory Visit: Payer: Self-pay | Admitting: *Deleted

## 2017-11-13 DIAGNOSIS — I10 Essential (primary) hypertension: Secondary | ICD-10-CM

## 2017-11-14 MED ORDER — LISINOPRIL-HYDROCHLOROTHIAZIDE 20-25 MG PO TABS: 1.0000 | ORAL_TABLET | Freq: Every day | ORAL | 0 refills | Status: DC

## 2017-11-20 ENCOUNTER — Encounter: Payer: Self-pay | Admitting: Gastroenterology

## 2017-11-25 ENCOUNTER — Encounter: Payer: Self-pay | Admitting: Gastroenterology

## 2018-01-18 ENCOUNTER — Encounter: Payer: Self-pay | Admitting: *Deleted

## 2018-02-04 ENCOUNTER — Encounter: Payer: Self-pay | Admitting: Gastroenterology

## 2018-02-04 ENCOUNTER — Ambulatory Visit (AMBULATORY_SURGERY_CENTER): Payer: Self-pay | Admitting: *Deleted

## 2018-02-04 ENCOUNTER — Other Ambulatory Visit: Payer: Self-pay

## 2018-02-04 VITALS — Ht 73.5 in | Wt 164.8 lb

## 2018-02-04 DIAGNOSIS — Z1211 Encounter for screening for malignant neoplasm of colon: Secondary | ICD-10-CM

## 2018-02-04 MED ORDER — PEG 3350-KCL-NA BICARB-NACL 420 G PO SOLR: 4000.0000 mL | Freq: Once | ORAL | 0 refills | Status: AC

## 2018-02-04 NOTE — Progress Notes (Signed)
No egg or soy allergy known to patient  No issues with past sedation with any surgeries  or procedures, no intubation problems  No diet pills per patient No home 02 use per patient  No blood thinners per patient  Pt denies issues with constipation  No A fib or A flutter  EMMI video sent to pt's e mail pt declined   

## 2018-02-18 ENCOUNTER — Ambulatory Visit (AMBULATORY_SURGERY_CENTER): Payer: Medicare HMO | Admitting: Gastroenterology

## 2018-02-18 ENCOUNTER — Encounter: Payer: Self-pay | Admitting: Gastroenterology

## 2018-02-18 VITALS — BP 135/84 | HR 57 | Temp 98.7°F | Resp 16 | Ht 73.0 in | Wt 164.0 lb

## 2018-02-18 DIAGNOSIS — K219 Gastro-esophageal reflux disease without esophagitis: Secondary | ICD-10-CM | POA: Diagnosis not present

## 2018-02-18 DIAGNOSIS — D122 Benign neoplasm of ascending colon: Secondary | ICD-10-CM | POA: Diagnosis not present

## 2018-02-18 DIAGNOSIS — Z86010 Personal history of colonic polyps: Secondary | ICD-10-CM | POA: Diagnosis not present

## 2018-02-18 DIAGNOSIS — Z1211 Encounter for screening for malignant neoplasm of colon: Secondary | ICD-10-CM

## 2018-02-18 DIAGNOSIS — K5730 Diverticulosis of large intestine without perforation or abscess without bleeding: Secondary | ICD-10-CM

## 2018-02-18 DIAGNOSIS — D125 Benign neoplasm of sigmoid colon: Secondary | ICD-10-CM

## 2018-02-18 DIAGNOSIS — I10 Essential (primary) hypertension: Secondary | ICD-10-CM | POA: Diagnosis not present

## 2018-02-18 DIAGNOSIS — K649 Unspecified hemorrhoids: Secondary | ICD-10-CM

## 2018-02-18 MED ORDER — SODIUM CHLORIDE 0.9 % IV SOLN: 500.0000 mL | Freq: Once | INTRAVENOUS | Status: DC

## 2018-02-18 NOTE — Progress Notes (Signed)
Report given to PACU, vss 

## 2018-02-18 NOTE — Patient Instructions (Signed)
YOU HAD AN ENDOSCOPIC PROCEDURE TODAY AT THE [facility]:   Refer to the procedure report that was given to you for any specific questions about what was found during the examination.  If the procedure report does not answer your questions, please call your gastroenterologist to clarify.  If you requested that your care partner not be given the details of your procedure findings, then the procedure report has been included in a sealed envelope for you to review at your convenience later.  YOU SHOULD EXPECT: Some feelings of bloating in the abdomen. Passage of more gas than usual.  Walking can help get rid of the air that was put into your GI tract during the procedure and reduce the bloating. If you had a lower endoscopy (such as a colonoscopy or flexible sigmoidoscopy) you may notice spotting of blood in your stool or on the toilet paper. If you underwent a bowel prep for your procedure, you may not have a normal bowel movement for a few days.  Please Note:  You might notice some irritation and congestion in your nose or some drainage.  This is from the oxygen used during your procedure.  There is no need for concern and it should clear up in a day or so.  SYMPTOMS TO REPORT IMMEDIATELY:   Following lower endoscopy (colonoscopy or flexible sigmoidoscopy):  Excessive amounts of blood in the stool  Significant tenderness or worsening of abdominal pains  Swelling of the abdomen that is new, acute  Fever of 100F or higher   For urgent or emergent issues, a gastroenterologist can be reached at any hour by calling [phone].   DIET:  We do recommend a small meal at first, but then you may proceed to your regular diet.  Drink plenty of fluids but you should avoid alcoholic beverages for 24 hours. Try to increase the fiber in your diet, and drink plenty of water.  ACTIVITY:  You should plan to take it easy for the rest of today and you should NOT DRIVE or use heavy machinery until  tomorrow (because of the sedation medicines used during the test).    FOLLOW UP: Our staff will call the number listed on your records the next business day following your procedure to check on you and address any questions or concerns that you may have regarding the information given to you following your procedure. If we do not reach you, we will leave a message.  However, if you are feeling well and you are not experiencing any problems, there is no need to return our call.  We will assume that you have returned to your regular daily activities without incident.  If any biopsies were taken you will be contacted by phone or by letter within the next 1-3 weeks.  Please call us at [phone] if you have not heard about the biopsies in 3 weeks.    SIGNATURES/CONFIDENTIALITY: You and/or your care partner have signed paperwork which will be entered into your electronic medical record.  These signatures attest to the fact that that the information above on your After Visit Summary has been reviewed and is understood.  Full responsibility of the confidentiality of this discharge information lies with you and/or your care-partner.  Read all handouts given to you by your recovery room nurse. 

## 2018-02-18 NOTE — Progress Notes (Signed)
Called to room to assist during endoscopic procedure.  Patient ID and intended procedure confirmed with present staff. Received instructions for my participation in the procedure from the performing physician.  

## 2018-02-18 NOTE — Op Note (Signed)
[place] Patient Name: Jonathan [name] Procedure Date: 02/18/2018 10:56 AM MRN: [id number] Endoscopist: Milus Banister , MD Age: 62 Referring MD:  Date of Birth: 11/05/1955 Gender: Male Account #: [id number] Procedure:                Colonoscopy Indications:              Screening for colorectal malignant neoplasm Medicines:                Monitored Anesthesia Care Procedure:                Pre-Anesthesia Assessment:                           - Prior to the procedure, a History and Physical                            was performed, and patient medications and                            allergies were reviewed. The patient's tolerance of                            previous anesthesia was also reviewed. The risks                            and benefits of the procedure and the sedation                            options and risks were discussed with the patient.                            All questions were answered, and informed consent                            was obtained. Prior Anticoagulants: The patient has                            taken no previous anticoagulant or antiplatelet                            agents. ASA Grade Assessment: II - A patient with                            mild systemic disease. After reviewing the risks                            and benefits, the patient was deemed in                            satisfactory condition to undergo the procedure.                           After obtaining informed consent, the colonoscope  was passed under direct vision. Throughout the                            procedure, the patient's blood pressure, pulse, and                            oxygen saturations were monitored continuously. The                            Colonoscope was introduced through the anus and                            advanced to the the cecum, identified by                            appendiceal orifice and  ileocecal valve. The                            colonoscopy was performed without difficulty. The                            patient tolerated the procedure well. The quality                            of the bowel preparation was good. The ileocecal                            valve, appendiceal orifice, and rectum were                            photographed. Scope In: 11:02:08 AM Scope Out: 11:14:02 AM Scope Withdrawal Time: 0 hours 9 minutes 53 seconds  Total Procedure Duration: 0 hours 11 minutes 54 seconds  Findings:                 Two sessile polyps were found in the sigmoid colon                            and ascending colon. The polyps were 2 to 6 mm in                            size. These polyps were removed with a cold snare.                            Resection was complete, but the polyp tissue was                            only partially retrieved.                           Multiple small and large-mouthed diverticula were                            found in the left colon.  External and internal hemorrhoids were found. The                            hemorrhoids were small.                           The exam was otherwise without abnormality on                            direct and retroflexion views. Complications:            No immediate complications. Estimated blood loss:                            None. Estimated Blood Loss:     Estimated blood loss: none. Impression:               - Two 2 to 6 mm polyps in the sigmoid colon and in                            the ascending colon, removed with a cold snare.                            Complete resection. Partial retrieval.                           - Diverticulosis in the left colon.                           - External and internal hemorrhoids.                           - The examination was otherwise normal on direct                            and retroflexion views. Recommendation:            - Patient has a contact number available for                            emergencies. The signs and symptoms of potential                            delayed complications were discussed with the                            patient. Return to normal activities tomorrow.                            Written discharge instructions were provided to the                            patient.                           - Resume previous diet.                           -  Continue present medications.                           You will receive a letter within 2-3 weeks with the                            pathology results and my final recommendations.                           If the polyp(s) is proven to be 'pre-cancerous' on                            pathology, you will need repeat colonoscopy in 5                            years. If the polyp(s) is NOT 'precancerous' on                            pathology then you should repeat colon cancer                            screening in 10 years with colonoscopy without need                            for colon cancer screening by any method prior to                            then (including stool testing). Milus Banister, MD 02/18/2018 11:17:15 AM This report has been signed electronically.

## 2018-02-18 NOTE — Progress Notes (Signed)
[name] states no medical or surgical changes since previsit or office visit. 

## 2018-02-19 ENCOUNTER — Telehealth: Payer: Self-pay | Admitting: *Deleted

## 2018-02-19 NOTE — Telephone Encounter (Signed)
  Follow up Call-  Call back number 02/18/2018  Post procedure Call Back phone  # [phone]  Permission to leave phone message Yes  Some recent data might be hidden     Patient questions:  Do you have a fever, pain , or abdominal swelling? No. Pain Score  0 *  Have you tolerated food without any problems? Yes.    Have you been able to return to your normal activities? Yes.    Do you have any questions about your discharge instructions: Diet   No. Medications  No. Follow up visit  No.  Do you have questions or concerns about your Care? No.  Actions: * If pain score is 4 or above: No action needed, pain <4.

## 2018-02-20 ENCOUNTER — Encounter: Payer: Self-pay | Admitting: Gastroenterology

## 2018-02-27 ENCOUNTER — Encounter: Payer: Medicare HMO | Admitting: Internal Medicine

## 2018-03-07 ENCOUNTER — Encounter: Payer: Self-pay | Admitting: *Deleted

## 2018-03-22 ENCOUNTER — Other Ambulatory Visit: Payer: Self-pay | Admitting: Oncology

## 2018-03-22 DIAGNOSIS — I10 Essential (primary) hypertension: Secondary | ICD-10-CM

## 2018-03-25 NOTE — Telephone Encounter (Signed)
Next appt scheduled 9/26 with PCP. 

## 2018-03-27 ENCOUNTER — Encounter: Payer: Medicare HMO | Admitting: Internal Medicine

## 2018-04-16 NOTE — Assessment & Plan Note (Signed)
Flu shot given today.   Colonoscopy July 2019:  - Two 2 to 6 mm polyps in the sigmoid colon and in the ascending colon, removed with a cold snare. Complete resection. Partial retrieval. - Diverticulosis in the left colon. - External and internal hemorrhoids.  Recommended f/u is 5-10 years depending on pathology results

## 2018-04-16 NOTE — Assessment & Plan Note (Signed)
LFTs were normal in Feb 2019

## 2018-04-17 ENCOUNTER — Encounter: Payer: Self-pay | Admitting: Internal Medicine

## 2018-04-17 ENCOUNTER — Other Ambulatory Visit: Payer: Self-pay

## 2018-04-17 ENCOUNTER — Encounter (INDEPENDENT_AMBULATORY_CARE_PROVIDER_SITE_OTHER): Payer: Self-pay

## 2018-04-17 ENCOUNTER — Ambulatory Visit (INDEPENDENT_AMBULATORY_CARE_PROVIDER_SITE_OTHER): Payer: Medicare HMO | Admitting: Internal Medicine

## 2018-04-17 VITALS — BP 163/90 | HR 59 | Temp 97.8°F | Ht 73.0 in | Wt 162.0 lb

## 2018-04-17 DIAGNOSIS — L602 Onychogryphosis: Secondary | ICD-10-CM | POA: Diagnosis not present

## 2018-04-17 DIAGNOSIS — F1910 Other psychoactive substance abuse, uncomplicated: Secondary | ICD-10-CM

## 2018-04-17 DIAGNOSIS — L84 Corns and callosities: Secondary | ICD-10-CM | POA: Diagnosis not present

## 2018-04-17 DIAGNOSIS — F17210 Nicotine dependence, cigarettes, uncomplicated: Secondary | ICD-10-CM

## 2018-04-17 DIAGNOSIS — Z299 Encounter for prophylactic measures, unspecified: Secondary | ICD-10-CM

## 2018-04-17 DIAGNOSIS — K5730 Diverticulosis of large intestine without perforation or abscess without bleeding: Secondary | ICD-10-CM

## 2018-04-17 DIAGNOSIS — Z23 Encounter for immunization: Secondary | ICD-10-CM

## 2018-04-17 DIAGNOSIS — Z720 Tobacco use: Secondary | ICD-10-CM

## 2018-04-17 DIAGNOSIS — Z86010 Personal history of colonic polyps: Secondary | ICD-10-CM

## 2018-04-17 DIAGNOSIS — F1410 Cocaine abuse, uncomplicated: Secondary | ICD-10-CM

## 2018-04-17 DIAGNOSIS — I10 Essential (primary) hypertension: Secondary | ICD-10-CM | POA: Diagnosis not present

## 2018-04-17 DIAGNOSIS — K648 Other hemorrhoids: Secondary | ICD-10-CM | POA: Diagnosis not present

## 2018-04-17 DIAGNOSIS — G8929 Other chronic pain: Secondary | ICD-10-CM | POA: Diagnosis not present

## 2018-04-17 DIAGNOSIS — Z79899 Other long term (current) drug therapy: Secondary | ICD-10-CM

## 2018-04-17 DIAGNOSIS — M79672 Pain in left foot: Secondary | ICD-10-CM | POA: Diagnosis not present

## 2018-04-17 DIAGNOSIS — M4800 Spinal stenosis, site unspecified: Secondary | ICD-10-CM

## 2018-04-17 DIAGNOSIS — M79671 Pain in right foot: Secondary | ICD-10-CM | POA: Diagnosis not present

## 2018-04-17 DIAGNOSIS — K644 Residual hemorrhoidal skin tags: Secondary | ICD-10-CM | POA: Diagnosis not present

## 2018-04-17 DIAGNOSIS — B182 Chronic viral hepatitis C: Secondary | ICD-10-CM

## 2018-04-17 DIAGNOSIS — Z8612 Personal history of poliomyelitis: Secondary | ICD-10-CM

## 2018-04-17 DIAGNOSIS — Z791 Long term (current) use of non-steroidal anti-inflammatories (NSAID): Secondary | ICD-10-CM

## 2018-04-17 MED ORDER — MELOXICAM 7.5 MG PO TABS: 7.5000 mg | ORAL_TABLET | Freq: Every day | ORAL | 2 refills | Status: DC

## 2018-04-17 MED ORDER — LISINOPRIL 10 MG PO TABS: 10.0000 mg | ORAL_TABLET | Freq: Every day | ORAL | 0 refills | Status: DC

## 2018-04-17 NOTE — Progress Notes (Signed)
   CC: high blood pressure   HPI:  [name] is a 62 y.o. with HTN, spinal stenosis, polysubstance use who presents for follow up of chronic disease.   Please see encounter tab for full details of HPI.   Past Medical History:  Diagnosis Date  . Allergy   . Anxiety   . Blood transfusion without reported diagnosis    as baby  . Chronic Bil Foot Pain 05/27/2011   Lt > Rt - since age 69 2/2 to post polio syndrome  Corns and callosities on both feet  Reconstruction surgery at age 58. Uses a brace on left leg for many years On prn tylenol for pain  No candidate for opiates due to active substance abuse Podiatry and PT (for brace eval) on 04/23/2013    . Chronic viral hepatitis C ([place]) 06/05/2006   History of blood transfusion in childhood Dx 2010 Reports history of Rx while in [place], unknown yr Elevated LFTs since 2010 and repeat 2014 U/S 2009 - no cirrhosis, repeat in 12/2013 >> normal.  Fibrosure >> pending results  Referred to Hep C clinic 04/23/2013 >> appt in 06/2013 Hep C clinic [phone] Immunity to hep A per labs at Hep C clinic  Started on Hep B series 1 st dose 02/03/2014, 2 dose given 04/23/2014. Last dose about 09/2014.  Follow with Hep C clinic       . ERECTILE DYSFUNCTION 06/05/2006   Qualifier: Diagnosis of  By: Grandville Silos MD, Quillian Quince    . Essential hypertension 06/05/2006   Dx in 2008  Has BP machine at home  Checks erratically  Chronic no show issues      . GERD (gastroesophageal reflux disease)    occ s/s  . Hypertension   . Polysubstance abuse ([place]) 04/23/2013   Cocaine (sniffs powder) and marijuana No IVDU   . POST-POLIO SYNDROME 06/05/2006   Hx of polio at age 59 Has spasms in his legs on and off Used Flexeril on and off for spasms 2015 May >> sent to rehab/PT. rec new leg braces/ortho      Physical Exam:  Vitals:   04/17/18 1424 04/17/18 1535  BP: (!) 146/83 (!) 163/90  Pulse: 72 (!) 59  Temp: 97.8 F (36.6 C)   TempSrc: Oral   SpO2: 100% 100%  Weight: 162  lb (73.5 kg)   Height: 6\' 1"  (1.854 m)    Gen: Well appearing, NAD CV: RRR, no murmurs Pulm: Normal effort, CTA throughout, no wheezing Ext: No lower extremity edema. Calluses on bilateral feet near base of great toes. Toenails are thick and darkened in color. No open wounds or foul odor. No redness or swelling.   Assessment & Plan:   See Encounters Tab for problem based charting.  Patient seen with Dr. Beryle Beams

## 2018-04-17 NOTE — Assessment & Plan Note (Signed)
Using cocaine 3 times a month (sniffs). Denies IVDU. Smoking marijuana daily for pain relief. Not interested in quitting. Able to maintain part time job and take care of grandchildren.

## 2018-04-17 NOTE — Patient Instructions (Addendum)
It was nice seeing you today. Thank you for choosing [facility] for your Primary Care.   For you foot pain, start taking Meloxicam (Mobic) once a day. I placed a Podiatry referral for you.   For you high blood pressure, I sent a prescription for 10mg  Lisinopril. I'd like you to take this 10mg  lisinopril pill in addition to your other two blood pressure pills.    FOLLOW-UP INSTRUCTIONS When: 2-3 weeks For: blood pressure What to bring: all medications  Please contact the clinic if you have any problems, or need to be seen sooner.

## 2018-04-17 NOTE — Assessment & Plan Note (Signed)
Chronic. H/o polio and foot reconstruction surgeries. Smokes marijuana for pain relief. Reports worsening pain over calluses on bilateral feet and worsening pain due to long, thick toenails. States his pain feels like his feet are trying to "cramp up in a ball." Walks with a cane. Works part time.   Plan: - podiatry referral - meloxicam 7.5mg  daily. Given a good rx coupon. Can increase to 15mg  if needed - f/u 2-3 weeks

## 2018-04-17 NOTE — Assessment & Plan Note (Addendum)
Uncontrolled. 146/83 and 163/90 today. Reports adherence with lisinopril-HCTZ 20-25mg  combo and amlodipine 10mg . Admits to smoking tobacco today which he feels increases his blood pressure. Denies chest pain, headache, or sob.   Plan: - add 10mg  lisinopril (he has two bottle of his combo pill at home so I don't want him to waste what he's already paid for).  - check renal function today  - f/u 2-3 weeks for BP check

## 2018-04-17 NOTE — Assessment & Plan Note (Signed)
Smoking 1 pack every few days. Not interested in quitting.

## 2018-04-18 LAB — BMP8+ANION GAP
Anion Gap: 16 mmol/L (ref 10.0–18.0)
BUN/Creatinine Ratio: 16 (ref 10–24)
BUN: 23 mg/dL (ref 8–27)
CALCIUM: 9.6 mg/dL (ref 8.6–10.2)
CHLORIDE: 105 mmol/L (ref 96–106)
CO2: 23 mmol/L (ref 20–29)
Creatinine, Ser: 1.48 mg/dL — ABNORMAL HIGH (ref 0.76–1.27)
GFR calc Af Amer: 59 mL/min/{1.73_m2} — ABNORMAL LOW (ref >59)
GFR calc non Af Amer: 51 mL/min/{1.73_m2} — ABNORMAL LOW (ref >59)
Glucose: 85 mg/dL (ref 65–99)
POTASSIUM: 3.8 mmol/L (ref 3.5–5.2)
Sodium: 144 mmol/L (ref 134–144)

## 2018-04-18 NOTE — Progress Notes (Signed)
Medicine attending: I personally interviewed and briefly examined this patient on the day of the patient visit and reviewed pertinent clinical ,laboratory data  with resident physician Dr. Francesco Runner and we discussed a management plan. 62 y/o, childhood polio, chronic foot pain, callous formation, HTN, hepatitis C, substance abuse w occasional cocaine. We will increase lisinopril to total 30 mg by adding separate 10 mg tab to his 20/25 lisinopril/HCTZ combo pill since he just refilled the combo pill. Refer back to [facility] for chronic feet problems. Continue meloxicam for pain control.

## 2018-05-02 ENCOUNTER — Encounter: Payer: Self-pay | Admitting: Podiatry

## 2018-05-02 ENCOUNTER — Ambulatory Visit (INDEPENDENT_AMBULATORY_CARE_PROVIDER_SITE_OTHER): Payer: Medicare HMO | Admitting: Podiatry

## 2018-05-02 ENCOUNTER — Ambulatory Visit: Payer: Medicare HMO

## 2018-05-02 VITALS — BP 148/84 | HR 63

## 2018-05-02 DIAGNOSIS — M79671 Pain in right foot: Secondary | ICD-10-CM

## 2018-05-02 DIAGNOSIS — M79672 Pain in left foot: Principal | ICD-10-CM

## 2018-05-02 DIAGNOSIS — L84 Corns and callosities: Secondary | ICD-10-CM

## 2018-05-02 DIAGNOSIS — M7750 Other enthesopathy of unspecified foot: Secondary | ICD-10-CM

## 2018-05-15 ENCOUNTER — Encounter: Payer: Medicare HMO | Admitting: Internal Medicine

## 2018-05-15 NOTE — Progress Notes (Deleted)
   CC: ***  HPI:  [name] is a 62 y.o. male with PMHx as below who presents for blood pressure follow up. Please see encounter tab for full details of HPI  Past Medical History:  Diagnosis Date  . Allergy   . Anxiety   . Blood transfusion without reported diagnosis    as baby  . Chronic Bil Foot Pain 05/27/2011   Lt > Rt - since age 73 2/2 to post polio syndrome  Corns and callosities on both feet  Reconstruction surgery at age 32. Uses a brace on left leg for many years On prn tylenol for pain  No candidate for opiates due to active substance abuse Podiatry and PT (for brace eval) on 04/23/2013    . Chronic viral hepatitis C ([place]) 06/05/2006   History of blood transfusion in childhood Dx 2010 Reports history of Rx while in [place], unknown yr Elevated LFTs since 2010 and repeat 2014 U/S 2009 - no cirrhosis, repeat in 12/2013 >> normal.  Fibrosure >> pending results  Referred to Hep C clinic 04/23/2013 >> appt in 06/2013 Hep C clinic [phone] Immunity to hep A per labs at Hep C clinic  Started on Hep B series 1 st dose 02/03/2014, 2 dose given 04/23/2014. Last dose about 09/2014.  Follow with Hep C clinic       . ERECTILE DYSFUNCTION 06/05/2006   Qualifier: Diagnosis of  By: Grandville Silos MD, Quillian Quince    . Essential hypertension 06/05/2006   Dx in 2008  Has BP machine at home  Checks erratically  Chronic no show issues      . GERD (gastroesophageal reflux disease)    occ s/s  . Hypertension   . Polysubstance abuse ([place]) 04/23/2013   Cocaine (sniffs powder) and marijuana No IVDU   . POST-POLIO SYNDROME 06/05/2006   Hx of polio at age 83 Has spasms in his legs on and off Used Flexeril on and off for spasms 2015 May >> sent to rehab/PT. rec new leg braces/ortho      Physical Exam:  There were no vitals filed for this visit. ***  Assessment & Plan:   See Encounters Tab for problem based charting.  Patient {GC/GE:3044014::"discussed with","seen with"} Dr.  {NAMES:[id number]::"Butcher","Granfortuna","E. Hoffman","Klima","Mullen","Narendra","Raines","Furry"}

## 2018-06-04 NOTE — Progress Notes (Signed)
   CC: follow up high blood pressure  HPI:  Jonathan [name] is a 62 y.o. male with PMHx as listed below who presents for follow up of high blood pressure after increasing his lisinopril at last visit.  He reports adherence to medications and is able to list off the names and doses appropriately.  He denies headache, chest pain, shortness of breath.  He also asks about prostate cancer screening.  He does not have any known family history of prostate cancer and denies significant urinary hesitancy or weak stream.  He also inquired about lung cancer screening, but he does not meet criteria given his 20-pack-year smoking history.  He is not interested in cutting back or quitting smoking.  He denies cough, shortness of breath, wheezing, hemoptysis.  He has no family history of lung cancer.  Past Medical History:  Diagnosis Date  . Allergy   . Anxiety   . Blood transfusion without reported diagnosis    as baby  . Chronic Bil Foot Pain 05/27/2011   Lt > Rt - since age 63 2/2 to post polio syndrome  Corns and callosities on both feet  Reconstruction surgery at age 32. Uses a brace on left leg for many years On prn tylenol for pain  No candidate for opiates due to active substance abuse Podiatry and PT (for brace eval) on 04/23/2013    . Chronic viral hepatitis C ([place]) 06/05/2006   History of blood transfusion in childhood Dx 2010 Reports history of Rx while in [place], unknown yr Elevated LFTs since 2010 and repeat 2014 U/S 2009 - no cirrhosis, repeat in 12/2013 >> normal.  Fibrosure >> pending results  Referred to Hep C clinic 04/23/2013 >> appt in 06/2013 Hep C clinic [phone] Immunity to hep A per labs at Hep C clinic  Started on Hep B series 1 st dose 02/03/2014, 2 dose given 04/23/2014. Last dose about 09/2014.  Follow with Hep C clinic       . ERECTILE DYSFUNCTION 06/05/2006   Qualifier: Diagnosis of  By: Grandville Silos MD, Quillian Quince    . Essential hypertension 06/05/2006   Dx in 2008  Has BP machine at  home  Checks erratically  Chronic no show issues      . GERD (gastroesophageal reflux disease)    occ s/s  . Hypertension   . Polysubstance abuse ([place]) 04/23/2013   Cocaine (sniffs powder) and marijuana No IVDU   . POST-POLIO SYNDROME 06/05/2006   Hx of polio at age 76 Has spasms in his legs on and off Used Flexeril on and off for spasms 2015 May >> sent to rehab/PT. rec new leg braces/ortho      Physical Exam:  Vitals:   06/05/18 1446  BP: 119/69  Pulse: 66  Temp: 98.2 F (36.8 C)  TempSrc: Oral  SpO2: 100%  Weight: 161 lb 1.6 oz (73.1 kg)  Height: 6\' 1"  (1.854 m)   Gen: Well appearing, NAD CV: RRR, no murmurs Pulm: Normal effort, CTA throughout, no wheezing Ext: Warm, no edema  Assessment & Plan:   See Encounters Tab for problem based charting.  Patient seen with Dr. Eppie Gibson

## 2018-06-05 ENCOUNTER — Encounter: Payer: Self-pay | Admitting: Internal Medicine

## 2018-06-05 ENCOUNTER — Ambulatory Visit (INDEPENDENT_AMBULATORY_CARE_PROVIDER_SITE_OTHER): Payer: Medicare HMO | Admitting: Internal Medicine

## 2018-06-05 ENCOUNTER — Other Ambulatory Visit: Payer: Self-pay

## 2018-06-05 VITALS — BP 119/69 | HR 66 | Temp 98.2°F | Ht 73.0 in | Wt 161.1 lb

## 2018-06-05 DIAGNOSIS — Z79899 Other long term (current) drug therapy: Secondary | ICD-10-CM | POA: Diagnosis not present

## 2018-06-05 DIAGNOSIS — I10 Essential (primary) hypertension: Secondary | ICD-10-CM | POA: Diagnosis not present

## 2018-06-05 DIAGNOSIS — Z125 Encounter for screening for malignant neoplasm of prostate: Secondary | ICD-10-CM

## 2018-06-05 DIAGNOSIS — F17210 Nicotine dependence, cigarettes, uncomplicated: Secondary | ICD-10-CM

## 2018-06-05 MED ORDER — LISINOPRIL 10 MG PO TABS: 10.0000 mg | ORAL_TABLET | Freq: Every day | ORAL | 3 refills | Status: DC

## 2018-06-05 MED ORDER — LISINOPRIL-HYDROCHLOROTHIAZIDE 20-25 MG PO TABS: 1.0000 | ORAL_TABLET | Freq: Every day | ORAL | 3 refills | Status: DC

## 2018-06-05 MED ORDER — AMLODIPINE BESYLATE 10 MG PO TABS: 10.0000 mg | ORAL_TABLET | Freq: Every day | ORAL | 3 refills | Status: DC

## 2018-06-05 NOTE — Assessment & Plan Note (Addendum)
Chronic, well controlled on new regiment.  He is tolerating medications well.  Denies headache, blurry vision, chest pain, shortness of breath.  Plan to continue daily lisinopril-HCTZ 20-25 mg, lisinopril 10 mg, amlodipine 10 mg.  Kidney function has been stable, will check again at next visit.  Still not interested in smoking cessation

## 2018-06-05 NOTE — Patient Instructions (Addendum)
It was nice seeing you today. Thank you for choosing [facility] for your Primary Care.   Today we talked about:  1) Glad we got your blood pressure under better control!  I have given you plenty of refills.  Please continue taking your medications. 2) We will get blood work for prostate cancer screening. I will call you with these results  FOLLOW-UP INSTRUCTIONS When: 6 months For: blood pressure  Please contact the clinic if you have any problems, or need to be seen sooner.

## 2018-06-05 NOTE — Progress Notes (Signed)
I saw and evaluated the patient.  I personally confirmed the key portions of Dr. Cristal Generous history and exam and reviewed pertinent patient test results.  The assessment, diagnosis, and plan were formulated together and I agree with the documentation in the resident's note.

## 2018-06-05 NOTE — Assessment & Plan Note (Signed)
Patient requesting screening for prostate cancer.  No family history or concerning urinary symptoms.  Will check PSA today.

## 2018-06-06 LAB — PSA: Prostate Specific Ag, Serum: 1.2 ng/mL (ref 0.0–4.0)

## 2018-07-21 ENCOUNTER — Other Ambulatory Visit: Payer: Self-pay | Admitting: Internal Medicine

## 2018-07-21 DIAGNOSIS — M79672 Pain in left foot: Principal | ICD-10-CM

## 2018-07-21 DIAGNOSIS — M79671 Pain in right foot: Secondary | ICD-10-CM

## 2018-08-18 ENCOUNTER — Other Ambulatory Visit: Payer: Self-pay | Admitting: Internal Medicine

## 2018-08-18 DIAGNOSIS — M79672 Pain in left foot: Principal | ICD-10-CM

## 2018-08-18 DIAGNOSIS — M79671 Pain in right foot: Secondary | ICD-10-CM

## 2018-10-05 ENCOUNTER — Other Ambulatory Visit: Payer: Self-pay | Admitting: Internal Medicine

## 2018-10-07 ENCOUNTER — Other Ambulatory Visit: Payer: Self-pay

## 2018-10-07 NOTE — Telephone Encounter (Signed)
atorvastatin (LIPITOR) 20 MG tablet, refill request @  [place], [place] - [address] [phone] (Phone) [phone] (Fax)

## 2018-10-08 MED ORDER — ATORVASTATIN CALCIUM 20 MG PO TABS: 20.0000 mg | ORAL_TABLET | Freq: Every day | ORAL | 3 refills | Status: DC

## 2018-12-11 ENCOUNTER — Encounter: Payer: Medicare HMO | Admitting: Internal Medicine

## 2018-12-20 ENCOUNTER — Other Ambulatory Visit: Payer: Self-pay | Admitting: Internal Medicine

## 2018-12-20 DIAGNOSIS — M79672 Pain in left foot: Secondary | ICD-10-CM

## 2018-12-20 DIAGNOSIS — M79671 Pain in right foot: Secondary | ICD-10-CM

## 2019-01-10 ENCOUNTER — Encounter: Payer: Self-pay | Admitting: *Deleted

## 2019-01-19 ENCOUNTER — Other Ambulatory Visit: Payer: Self-pay | Admitting: Internal Medicine

## 2019-01-19 DIAGNOSIS — M79672 Pain in left foot: Secondary | ICD-10-CM

## 2019-01-19 DIAGNOSIS — M79671 Pain in right foot: Secondary | ICD-10-CM

## 2019-01-22 ENCOUNTER — Other Ambulatory Visit: Payer: Self-pay

## 2019-01-22 ENCOUNTER — Encounter: Payer: Self-pay | Admitting: Internal Medicine

## 2019-01-22 ENCOUNTER — Ambulatory Visit (INDEPENDENT_AMBULATORY_CARE_PROVIDER_SITE_OTHER): Payer: Medicare HMO | Admitting: Internal Medicine

## 2019-01-22 VITALS — BP 114/78 | HR 86 | Temp 98.2°F | Ht 73.5 in | Wt 167.0 lb

## 2019-01-22 DIAGNOSIS — F17200 Nicotine dependence, unspecified, uncomplicated: Secondary | ICD-10-CM | POA: Diagnosis not present

## 2019-01-22 DIAGNOSIS — Z0000 Encounter for general adult medical examination without abnormal findings: Secondary | ICD-10-CM

## 2019-01-22 DIAGNOSIS — Z79899 Other long term (current) drug therapy: Secondary | ICD-10-CM

## 2019-01-22 DIAGNOSIS — D369 Benign neoplasm, unspecified site: Secondary | ICD-10-CM | POA: Insufficient documentation

## 2019-01-22 DIAGNOSIS — B182 Chronic viral hepatitis C: Secondary | ICD-10-CM

## 2019-01-22 DIAGNOSIS — I10 Essential (primary) hypertension: Secondary | ICD-10-CM | POA: Diagnosis not present

## 2019-01-22 DIAGNOSIS — F17210 Nicotine dependence, cigarettes, uncomplicated: Secondary | ICD-10-CM

## 2019-01-22 DIAGNOSIS — M5416 Radiculopathy, lumbar region: Secondary | ICD-10-CM

## 2019-01-22 NOTE — Assessment & Plan Note (Signed)
Endorses right hip pain today. Has been intermittently present for the last 5 years. Of note, he does have post-polio syndrome affecting the left side which affect his gate. Lumbar xray in 2017 did exhibit some spinal stenosis and bone spurs of hip joint.  We discussed previous findings and affects of discomfort on his day to day life to guide management. Jonathan [name] has elected to hold off for now on further evaluation and will let us know if he would like to move forward with it in the future.

## 2019-01-22 NOTE — Assessment & Plan Note (Signed)
20 pack year history. Has attempted to quit several times in the past. We discussed importance of smoking cessation. He is considering it at this time and will let us know if he would like assistance with cessation in the future.

## 2019-01-22 NOTE — Patient Instructions (Addendum)
I have placed a referral to optometry for your vision screening.  As discussed, please let us know if your hip starts to cause you more discomfort.  Our office will call you with lab results.  F/u with me in 1 year for annual or sooner if the need arises.

## 2019-01-22 NOTE — Progress Notes (Signed)
CC: annual exam  HPI:  [name] is a 63 y.o. male who presents today for wellness exam.  He does wish to discuss some hypopigmentation of the scalp. It has been present for 2 years and has not changed. Non-purretic. No scaling or erythema.  Right hip pain for 5 years. Slowing worsening. Located primarily in right lower lumbar region and wraps to right inguinal region. Exacerbated with high activity. Pt does recognize that this is likely secondary to his spinal stenosis and abnormal gait from polio syndrome. Denies any shooting pain radiating to his feet, saddle anesthesia, or bowel/bladder dysfunction. Requests optometry referral.  Past Medical History:  Diagnosis Date  . Allergy   . Anxiety   . Blood transfusion without reported diagnosis    as baby  . Chronic Bil Foot Pain 05/27/2011   Lt > Rt - since age 5 2/2 to post polio syndrome  Corns and callosities on both feet  Reconstruction surgery at age 44. Uses a brace on left leg for many years On prn tylenol for pain  No candidate for opiates due to active substance abuse Podiatry and PT (for brace eval) on 04/23/2013    . Chronic viral hepatitis C ([place]) 06/05/2006   History of blood transfusion in childhood Dx 2010 Reports history of Rx while in [place], unknown yr Elevated LFTs since 2010 and repeat 2014 U/S 2009 - no cirrhosis, repeat in 12/2013 >> normal.  Fibrosure >> pending results  Referred to Hep C clinic 04/23/2013 >> appt in 06/2013 Hep C clinic [phone] Immunity to hep A per labs at Hep C clinic  Started on Hep B series 1 st dose 02/03/2014, 2 dose given 04/23/2014. Last dose about 09/2014.  Follow with Hep C clinic       . ERECTILE DYSFUNCTION 06/05/2006   Qualifier: Diagnosis of  By: Grandville Silos MD, Quillian Quince    . Essential hypertension 06/05/2006   Dx in 2008  Has BP machine at home  Checks erratically  Chronic no show issues      . GERD (gastroesophageal reflux disease)    occ s/s  . Hypertension   . Polysubstance  abuse ([place]) 04/23/2013   Cocaine (sniffs powder) and marijuana No IVDU   . POST-POLIO SYNDROME 06/05/2006   Hx of polio at age 59 Has spasms in his legs on and off Used Flexeril on and off for spasms 2015 May >> sent to rehab/PT. rec new leg braces/ortho     Review of Systems:  negative other than those stated in HPI  Physical Exam:  Vitals:   01/22/19 1502  BP: 114/78  Pulse: 86  Temp: 98.2 F (36.8 C)  TempSrc: Oral  SpO2: 100%  Weight: 167 lb (75.8 kg)  Height: 6' 1.5" (1.867 m)    GENERAL: well appearing. In no acute distress CARDIAC: RRR. No murmurs appreciated PULMONARY: lung sounds clear to auscultation ABDOMEN: abdomen soft, non-distended. Bowel sounds active.  SKIN: patchy hypigmentation on scalp. No scaling or erythema. Musculoskeletal--HIP: atraumatic. Full ROM. Some discomfort with right hip flexion and internal rotation. Neg straight leg test bilaterally. Strength 5/5 on right hip flexion and extension. NEURO: pinprick sensation intact to right lower extremity extending from foot to hip.   Assessment & Plan:   See Encounters Tab for problem based charting.  Pertinent labs & imaging results that were available during my care of the patient were reviewed by me and considered in my medical decision making  Diet and weight goals discussed. Patient is  in agreement with the plan and endorses no further questions at this time. Smoking cessation instruction/counseling given:  counseled patient on the dangers of tobacco use, advised patient to stop smoking, and reviewed strategies to maximize success  Patient seen with Dr. Clista Bernhardt, MD Internal Medicine Resident-PGY1

## 2019-01-22 NOTE — Assessment & Plan Note (Signed)
Currently maintained on amlodipine, lisinopril/htz and lisinopril. Denies any issues with hypotensive episodes. Will continue current regimen. F/u one year.

## 2019-01-23 LAB — BMP8+ANION GAP
Anion Gap: 16 mmol/L (ref 10.0–18.0)
BUN/Creatinine Ratio: 16 (ref 10–24)
BUN: 26 mg/dL (ref 8–27)
CO2: 22 mmol/L (ref 20–29)
Calcium: 9.7 mg/dL (ref 8.6–10.2)
Chloride: 107 mmol/L — ABNORMAL HIGH (ref 96–106)
Creatinine, Ser: 1.62 mg/dL — ABNORMAL HIGH (ref 0.76–1.27)
GFR calc Af Amer: 52 mL/min/{1.73_m2} — ABNORMAL LOW (ref >59)
GFR calc non Af Amer: 45 mL/min/{1.73_m2} — ABNORMAL LOW (ref >59)
Glucose: 73 mg/dL (ref 65–99)
Potassium: 4 mmol/L (ref 3.5–5.2)
Sodium: 145 mmol/L — ABNORMAL HIGH (ref 134–144)

## 2019-01-23 LAB — LIPID PANEL
Chol/HDL Ratio: 1.7 ratio (ref 0.0–5.0)
Cholesterol, Total: 120 mg/dL (ref 100–199)
HDL: 69 mg/dL (ref >39)
LDL Calculated: 31 mg/dL (ref 0–99)
Triglycerides: 102 mg/dL (ref 0–149)
VLDL Cholesterol Cal: 20 mg/dL (ref 5–40)

## 2019-01-26 NOTE — Addendum Note (Signed)
Addended by: Mitzi Hansen on: 01/26/2019 09:02 AM   Modules accepted: Orders

## 2019-01-26 NOTE — Progress Notes (Signed)
And also let him know that his cholesterol looked good! Thank you

## 2019-01-26 NOTE — Addendum Note (Signed)
Addended by: Lucious Groves on: 01/26/2019 08:54 AM   Modules accepted: Orders

## 2019-02-01 NOTE — Progress Notes (Signed)
Subjective:  Patient ID: Jonathan [name], male    DOB: [birth year],  MRN: [id number]  Chief Complaint  Patient presents with  . Foot Pain    bilateral foot pain/ corn/callous    63 y.o. male presents with the above complaint. Reports painfuol calluses to the bottom of his feet on the outside.  Would like to also note wearing a good comfortable shoes.   Review of Systems: Negative except as noted in the HPI. Denies N/V/F/Ch.  Past Medical History:  Diagnosis Date  . Allergy   . Anxiety   . Blood transfusion without reported diagnosis    as baby  . Chronic Bil Foot Pain 05/27/2011   Lt > Rt - since age 78 2/2 to post polio syndrome  Corns and callosities on both feet  Reconstruction surgery at age 61. Uses a brace on left leg for many years On prn tylenol for pain  No candidate for opiates due to active substance abuse Podiatry and PT (for brace eval) on 04/23/2013    . Chronic viral hepatitis C ([place]) 06/05/2006   History of blood transfusion in childhood Dx 2010 Reports history of Rx while in [place], unknown yr Elevated LFTs since 2010 and repeat 2014 U/S 2009 - no cirrhosis, repeat in 12/2013 >> normal.  Fibrosure >> pending results  Referred to Hep C clinic 04/23/2013 >> appt in 06/2013 Hep C clinic [phone] Immunity to hep A per labs at Hep C clinic  Started on Hep B series 1 st dose 02/03/2014, 2 dose given 04/23/2014. Last dose about 09/2014.  Follow with Hep C clinic       . ERECTILE DYSFUNCTION 06/05/2006   Qualifier: Diagnosis of  By: Grandville Silos MD, Quillian Quince    . Essential hypertension 06/05/2006   Dx in 2008  Has BP machine at home  Checks erratically  Chronic no show issues      . GERD (gastroesophageal reflux disease)    occ s/s  . Hypertension   . Polysubstance abuse ([place]) 04/23/2013   Cocaine (sniffs powder) and marijuana No IVDU   . POST-POLIO SYNDROME 06/05/2006   Hx of polio at age 61 Has spasms in his legs on and off Used Flexeril on and off for spasms 2015 May >> sent to  rehab/PT. rec new leg braces/ortho      Current Outpatient Medications:  .  acetaminophen (TYLENOL) 325 MG tablet, Take 650 mg by mouth every 6 (six) hours as needed., Disp: , Rfl:  .  aspirin-acetaminophen-caffeine (EXCEDRIN MIGRAINE) 250-250-65 MG tablet, Take by mouth every 6 (six) hours as needed for headache., Disp: , Rfl:  .  diclofenac sodium (VOLTAREN) 1 % GEL, Apply 2 g topically 4 (four) times daily., Disp: 100 g, Rfl: 2 .  ibuprofen (ADVIL,MOTRIN) 200 MG tablet, Take 200 mg by mouth every 6 (six) hours as needed., Disp: , Rfl:  .  amLODipine (NORVASC) 10 MG tablet, Take 1 tablet (10 mg total) by mouth daily., Disp: 90 tablet, Rfl: 3 .  atorvastatin (LIPITOR) 20 MG tablet, Take 1 tablet (20 mg total) by mouth daily., Disp: 90 tablet, Rfl: 3 .  gabapentin (NEURONTIN) 300 MG capsule, Take 1 capsule (300 mg total) by mouth at bedtime., Disp: 30 capsule, Rfl: 2 .  lisinopril (PRINIVIL,ZESTRIL) 10 MG tablet, Take 1 tablet (10 mg total) by mouth daily., Disp: 90 tablet, Rfl: 3 .  lisinopril-hydrochlorothiazide (PRINZIDE,ZESTORETIC) 20-25 MG tablet, Take 1 tablet by mouth daily., Disp: 90 tablet, Rfl: 3  Social History  Tobacco Use  Smoking Status Current Every Day Smoker  . Packs/day: 0.50  . Years: 35.00  . Pack years: 17.50  . Types: Cigarettes  Smokeless Tobacco Never Used    No Known Allergies Objective:   Vitals:   05/02/18 1511  BP: (!) 148/84  Pulse: 63   There is no height or weight on file to calculate BMI. Constitutional Well developed. Well nourished.  Vascular Dorsalis pedis pulses palpable bilaterally. Posterior tibial pulses palpable bilaterally. Capillary refill normal to all digits.  No cyanosis or clubbing noted. Pedal hair growth normal.  Neurologic Normal speech. Oriented to person, place, and time. Epicritic sensation to light touch grossly present bilaterally.  Dermatologic Nails well groomed and normal in appearance. No open wounds. Multiple  hypkeratosis 5th MPJ  Orthopedic: POP 5th MPJ Bilat   Radiographs: Taken and reviewed no acute fractures or dislocations Assessment:   1. Capsulitis of metatarsophalangeal (MTP) joint   2. Callus of foot    Plan:  Patient was evaluated and treated and all questions answered.  5th MPJ Capslitis with HPK -Educated on self-care and proper shoegear. -Courtesy debridement -F/u PRN.  No follow-ups on file.

## 2019-02-03 NOTE — Progress Notes (Signed)
Internal Medicine Clinic Attending  I saw and evaluated the patient.  I personally confirmed the key portions of the history and exam documented by Dr. Christian and I reviewed pertinent patient test results.  The assessment, diagnosis, and plan were formulated together and I agree with the documentation in the resident's note.  Alexander Raines, M.D., Ph.D.  

## 2019-02-04 ENCOUNTER — Ambulatory Visit (HOSPITAL_COMMUNITY)
Admission: EM | Admit: 2019-02-04 | Discharge: 2019-02-04 | Disposition: A | Discharge status: Home / Self Care | Payer: Medicare HMO | Attending: Family Medicine | Admitting: Family Medicine

## 2019-02-04 ENCOUNTER — Other Ambulatory Visit: Payer: Self-pay

## 2019-02-04 ENCOUNTER — Encounter (HOSPITAL_COMMUNITY): Payer: Self-pay | Admitting: Emergency Medicine

## 2019-02-04 ENCOUNTER — Telehealth: Payer: Self-pay | Admitting: *Deleted

## 2019-02-04 DIAGNOSIS — L0291 Cutaneous abscess, unspecified: Secondary | ICD-10-CM | POA: Diagnosis not present

## 2019-02-04 MED ORDER — DOXYCYCLINE HYCLATE 100 MG PO CAPS: 100.0000 mg | ORAL_CAPSULE | Freq: Two times a day (BID) | ORAL | 0 refills | Status: DC

## 2019-02-04 NOTE — ED Provider Notes (Signed)
Jonathan [name]   [id number] 02/04/19 Arrival Time: 1749  ASSESSMENT & PLAN:  1. Abscess    Declines I&D which I think he needs. Discussed. Prefers trial of antibiotic first.  Meds ordered this encounter  Medications  . doxycycline (VIBRAMYCIN) 100 MG capsule    Sig: Take 1 capsule (100 mg total) by mouth 2 (two) times daily.    Dispense:  20 capsule    Refill:  0   To return if not improving or worsening. OTC analgesics as needed.  Reviewed expectations re: course of current medical issues. Questions answered. Outlined signs and symptoms indicating need for more acute intervention. Patient verbalized understanding. After Visit Summary given.   SUBJECTIVE:  Jonathan [name] is a 63 y.o. male who presents with a possible infection of his L groin. Onset gradual, approximately a few days ago without active drainage and without active bleeding. Symptoms have gradually worsened since beginning. Fever: absent. OTC/home treatment: none reported. H/O similar last week 'but came to a head an busted'. No specific aggravating or alleviating factors reported. No urinary symptoms. No penile discharge.  ROS: As per HPI. All other systems negative.   OBJECTIVE:  Vitals:   02/04/19 1020  BP: 111/77  Pulse: 84  Resp: 16  Temp: 97.9 F (36.6 C)  TempSrc: Oral  SpO2: 98%    General appearance: alert; no distress Neck: supple with FROM CV: RRR Lungs: CTAB Abd: soft; non-tender Skin: 2x3 cm induration of his L groin; tender to touch; no active drainage or bleeding; mild overlying erythema; no inguinal LAD Ext: no edema Psychological: alert and cooperative; normal mood and affect  No Known Allergies  Past Medical History:  Diagnosis Date  . Allergy   . Anxiety   . Blood transfusion without reported diagnosis    as baby  . Chronic Bil Foot Pain 05/27/2011   Lt > Rt - since age 82 2/2 to post polio syndrome  Corns and callosities on both feet  Reconstruction surgery at  age 42. Uses a brace on left leg for many years On prn tylenol for pain  No candidate for opiates due to active substance abuse Podiatry and PT (for brace eval) on 04/23/2013    . Chronic viral hepatitis C ([place]) 06/05/2006   History of blood transfusion in childhood Dx 2010 Reports history of Rx while in [place], unknown yr Elevated LFTs since 2010 and repeat 2014 U/S 2009 - no cirrhosis, repeat in 12/2013 >> normal.  Fibrosure >> pending results  Referred to Hep C clinic 04/23/2013 >> appt in 06/2013 Hep C clinic [phone] Immunity to hep A per labs at Hep C clinic  Started on Hep B series 1 st dose 02/03/2014, 2 dose given 04/23/2014. Last dose about 09/2014.  Follow with Hep C clinic       . ERECTILE DYSFUNCTION 06/05/2006   Qualifier: Diagnosis of  By: Grandville Silos MD, Quillian Quince    . Essential hypertension 06/05/2006   Dx in 2008  Has BP machine at home  Checks erratically  Chronic no show issues      . GERD (gastroesophageal reflux disease)    occ s/s  . Hypertension   . Polysubstance abuse ([place]) 04/23/2013   Cocaine (sniffs powder) and marijuana No IVDU   . POST-POLIO SYNDROME 06/05/2006   Hx of polio at age 77 Has spasms in his legs on and off Used Flexeril on and off for spasms 2015 May >> sent to rehab/PT. rec new leg braces/ortho  Social History   Socioeconomic History  . Marital status: Single    Spouse name: Not on file  . Number of children: Not on file  . Years of education: Not on file  . Highest education level: Not on file  Occupational History  . Not on file  Social Needs  . Financial resource strain: Not on file  . Food insecurity    Worry: Not on file    Inability: Not on file  . Transportation needs    Medical: Not on file    Non-medical: Not on file  Tobacco Use  . Smoking status: Current Every Day Smoker    Packs/day: 0.50    Years: 35.00    Pack years: 17.50    Types: Cigarettes  . Smokeless tobacco: Never Used  Substance and Sexual Activity  . Alcohol use: Yes     Alcohol/week: 21.0 standard drinks    Types: 21 Standard drinks or equivalent per week    Comment: beer everyday  . Drug use: Yes    Types: Marijuana    Comment: marijuana  . Sexual activity: Not on file  Lifestyle  . Physical activity    Days per week: Not on file    Minutes per session: Not on file  . Stress: Not on file  Relationships  . Social Herbalist on phone: Not on file    Gets together: Not on file    Attends religious service: Not on file    Active member of club or organization: Not on file    Attends meetings of clubs or organizations: Not on file    Relationship status: Not on file  Other Topics Concern  . Not on file  Social History Narrative  . Not on file   Family History  Problem Relation Age of Onset  . Cancer Mother   . Breast cancer Mother   . Colon cancer Neg Hx   . Colon polyps Neg Hx    Past Surgical History:  Procedure Laterality Date  . COLONOSCOPY    . FRACTURE SURGERY    . KNEE ARTHROSCOPY Right            Vanessa Kick, MD 02/04/19 1040

## 2019-02-04 NOTE — Telephone Encounter (Addendum)
-----   Message from Mitzi Hansen, MD sent at 01/26/2019  9:14 AM EDT ----- And also let him know that his cholesterol looked good! Thank you    Notes recorded by Mitzi Hansen, MD on 01/26/2019 at 9:01 AM EDT  Jonathan [name], could you please call Mr. Terriquez and let him know that I would like to check a urine sample since his kidney function is a little higher than last time? [place] desk, if you could please get him scheduled to come in to do this, that would be great! Thank you!

## 2019-02-04 NOTE — ED Triage Notes (Signed)
PT has abscess to left groin for a few days.

## 2019-02-04 NOTE — Telephone Encounter (Signed)
Pt scheduled for 7/16 lab appt only-orders on chart..kg

## 2019-02-05 ENCOUNTER — Other Ambulatory Visit: Payer: Self-pay

## 2019-02-05 ENCOUNTER — Other Ambulatory Visit: Payer: Medicare HMO

## 2019-02-05 DIAGNOSIS — I10 Essential (primary) hypertension: Secondary | ICD-10-CM | POA: Diagnosis not present

## 2019-02-05 MED ORDER — ATORVASTATIN CALCIUM 20 MG PO TABS: 20.0000 mg | ORAL_TABLET | Freq: Every day | ORAL | 3 refills | Status: DC

## 2019-02-05 NOTE — Telephone Encounter (Signed)
atorvastatin (LIPITOR) 20 MG tablet, refill request @  [facility] ([place]), [place] - [address] [phone] (Phone) [phone] (Fax

## 2019-02-06 ENCOUNTER — Other Ambulatory Visit: Payer: Medicare HMO

## 2019-02-06 LAB — URINALYSIS, ROUTINE W REFLEX MICROSCOPIC
Bilirubin, UA: NEGATIVE
Glucose, UA: NEGATIVE
Ketones, UA: NEGATIVE
Leukocytes,UA: NEGATIVE
Nitrite, UA: NEGATIVE
Protein,UA: NEGATIVE
RBC, UA: NEGATIVE
Specific Gravity, UA: 1.015 (ref 1.005–1.030)
Urobilinogen, Ur: 0.2 mg/dL (ref 0.2–1.0)
pH, UA: 5 (ref 5.0–7.5)

## 2019-02-06 LAB — PROTEIN,TOTAL,URINE: Protein, Ur: 6.7 mg/dL

## 2019-02-06 LAB — CREATININE, URINE, RANDOM: Creatinine, Urine: 106.9 mg/dL

## 2019-02-09 ENCOUNTER — Telehealth: Payer: Self-pay | Admitting: *Deleted

## 2019-02-09 NOTE — Telephone Encounter (Signed)
-----   Message from Mitzi Hansen, MD sent at 02/09/2019  8:14 AM EDT ----- Please let Mr. Rietz know that his urine labs came back normal.

## 2019-02-09 NOTE — Telephone Encounter (Signed)
Pt called / informed " Please let Jonathan [name] know that his urine labs came back normal" per Dr Dr Darrick Meigs.

## 2019-02-19 DIAGNOSIS — H40033 Anatomical narrow angle, bilateral: Secondary | ICD-10-CM | POA: Diagnosis not present

## 2019-02-19 DIAGNOSIS — H18413 Arcus senilis, bilateral: Secondary | ICD-10-CM | POA: Diagnosis not present

## 2019-02-19 DIAGNOSIS — H2513 Age-related nuclear cataract, bilateral: Secondary | ICD-10-CM | POA: Diagnosis not present

## 2019-02-19 DIAGNOSIS — H11153 Pinguecula, bilateral: Secondary | ICD-10-CM | POA: Diagnosis not present

## 2019-02-19 DIAGNOSIS — H40023 Open angle with borderline findings, high risk, bilateral: Secondary | ICD-10-CM | POA: Diagnosis not present

## 2019-02-20 ENCOUNTER — Other Ambulatory Visit: Payer: Self-pay | Admitting: Internal Medicine

## 2019-02-20 DIAGNOSIS — M79672 Pain in left foot: Secondary | ICD-10-CM

## 2019-02-20 DIAGNOSIS — M79671 Pain in right foot: Secondary | ICD-10-CM

## 2019-02-23 NOTE — Telephone Encounter (Signed)
Requesting to speak with a nurse about getting a refill on meloxicam, pt is using [facility] on [address]. Please call pt back.

## 2019-02-23 NOTE — Telephone Encounter (Signed)
Pt called / informed "let [name] know that he should return to the clinic for evaluation. Meloxicam is less than ideal for his renal function so we should probably not keep using this. " per Dr Darrick Meigs. [facility] appt scheduled Wed 8/5.

## 2019-02-25 ENCOUNTER — Other Ambulatory Visit: Payer: Self-pay

## 2019-02-25 ENCOUNTER — Ambulatory Visit (INDEPENDENT_AMBULATORY_CARE_PROVIDER_SITE_OTHER): Payer: Medicare HMO | Admitting: Internal Medicine

## 2019-02-25 VITALS — BP 120/75 | HR 74 | Temp 98.0°F | Ht 73.5 in | Wt 167.1 lb

## 2019-02-25 DIAGNOSIS — B91 Sequelae of poliomyelitis: Secondary | ICD-10-CM

## 2019-02-25 DIAGNOSIS — M48061 Spinal stenosis, lumbar region without neurogenic claudication: Secondary | ICD-10-CM

## 2019-02-25 DIAGNOSIS — R531 Weakness: Secondary | ICD-10-CM | POA: Diagnosis not present

## 2019-02-25 DIAGNOSIS — G8929 Other chronic pain: Secondary | ICD-10-CM

## 2019-02-25 DIAGNOSIS — Z791 Long term (current) use of non-steroidal anti-inflammatories (NSAID): Secondary | ICD-10-CM | POA: Diagnosis not present

## 2019-02-25 DIAGNOSIS — Z79899 Other long term (current) drug therapy: Secondary | ICD-10-CM

## 2019-02-25 DIAGNOSIS — M545 Low back pain: Secondary | ICD-10-CM

## 2019-02-25 MED ORDER — DULOXETINE HCL 30 MG PO CPEP: 30.0000 mg | ORAL_CAPSULE | Freq: Every day | ORAL | 2 refills | Status: DC

## 2019-02-25 MED ORDER — TRAMADOL HCL 50 MG PO TABS: 50.0000 mg | ORAL_TABLET | Freq: Four times a day (QID) | ORAL | 0 refills | Status: AC | PRN

## 2019-02-26 ENCOUNTER — Encounter: Payer: Self-pay | Admitting: Internal Medicine

## 2019-02-26 NOTE — Progress Notes (Signed)
Internal Medicine Clinic Attending  Case discussed with Dr. Bloomfield at the time of the visit.  We reviewed the resident's history and exam and pertinent patient test results.  I agree with the assessment, diagnosis, and plan of care documented in the resident's note.  

## 2019-02-26 NOTE — Progress Notes (Signed)
   CC: back and leg pain   HPI:  [name] is a 63 y.o. gentleman with history of lumbar spinal stenosis, post-polio syndrome who presents for follow-up on low back and leg pain. Please see problem based charting for further details.   Past Medical History:  Diagnosis Date  . Allergy   . Anxiety   . Blood transfusion without reported diagnosis    as baby  . Chronic Bil Foot Pain 05/27/2011   Lt > Rt - since age 89 2/2 to post polio syndrome  Corns and callosities on both feet  Reconstruction surgery at age 44. Uses a brace on left leg for many years On prn tylenol for pain  No candidate for opiates due to active substance abuse Podiatry and PT (for brace eval) on 04/23/2013    . Chronic viral hepatitis C ([place]) 06/05/2006   History of blood transfusion in childhood Dx 2010 Reports history of Rx while in [place], unknown yr Elevated LFTs since 2010 and repeat 2014 U/S 2009 - no cirrhosis, repeat in 12/2013 >> normal.  Fibrosure >> pending results  Referred to Hep C clinic 04/23/2013 >> appt in 06/2013 Hep C clinic [phone] Immunity to hep A per labs at Hep C clinic  Started on Hep B series 1 st dose 02/03/2014, 2 dose given 04/23/2014. Last dose about 09/2014.  Follow with Hep C clinic       . ERECTILE DYSFUNCTION 06/05/2006   Qualifier: Diagnosis of  By: Grandville Silos MD, Quillian Quince    . Essential hypertension 06/05/2006   Dx in 2008  Has BP machine at home  Checks erratically  Chronic no show issues      . GERD (gastroesophageal reflux disease)    occ s/s  . Hypertension   . Polysubstance abuse ([place]) 04/23/2013   Cocaine (sniffs powder) and marijuana No IVDU   . POST-POLIO SYNDROME 06/05/2006   Hx of polio at age 82 Has spasms in his legs on and off Used Flexeril on and off for spasms 2015 May >> sent to rehab/PT. rec new leg braces/ortho     Review of Systems:   Constitutional: negative for fevers, chills, weight loss Neuro: negative for bowel or bladder incontinence, increased weakness  from baseline, falls MSK: negative for other joint swelling or pain Skin: no rashes or skin changes   Physical Exam:  Vitals:   02/25/19 1446  BP: 120/75  Pulse: 74  Temp: 98 F (36.7 C)  TempSrc: Oral  SpO2: 100%  Weight: 167 lb 1.6 oz (75.8 kg)  Height: 6' 1.5" (1.867 m)   General: alert, well-appearing gentleman in NAD MSK: paraspinal muscles to lumbar region are tender bilaterally, R>L Neuro: 5/5 strength in RLE, 3/5 LLE. Sensation intact. Antalgic gait.  Psych: pleasant mood and affect    Assessment & Plan:   See Encounters Tab for problem based charting.  Patient discussed with Dr. Evette Doffing

## 2019-02-26 NOTE — Assessment & Plan Note (Signed)
Patient presents for follow-up on pain management for chronic low back pain and RLE pain in the setting of severe lumbar spinal stenosis and post-polio syndrome with residual LLE weakness. He had been managing his pain with daily Meloxicam, but was instructed to discontinue after worsening renal function was noted on routine labs with most recent PCP visit.  No red flag symptoms or worsening pain. Pain limits him functionally, primarily with ambulation and requires use of a cane.  He was referred to neurosurgery in 2018 after MRI revealed severe lumbar spinal stenosis with right sided  nerve root encroachment, but patient declined any surgical intervention at this time.  We discussed that options for long-term medical therapy are limited for his type of pain. He was amenable to referral to orthopedic surgery to discuss further treatment options including spinal injections vs minimally invasive surgery.  Will initiate Tramadol and Cymbalta for better pain control until he can be seen by orthopedic surgeon.

## 2019-03-04 ENCOUNTER — Ambulatory Visit: Payer: Medicare HMO | Admitting: Family Medicine

## 2019-03-04 NOTE — Addendum Note (Signed)
Addended by: Hulan Fray on: 03/04/2019 07:34 PM   Modules accepted: Orders

## 2019-04-08 ENCOUNTER — Encounter (HOSPITAL_COMMUNITY): Payer: Self-pay | Admitting: Obstetrics and Gynecology

## 2019-04-08 ENCOUNTER — Emergency Department (HOSPITAL_COMMUNITY)
Admission: EM | Admit: 2019-04-08 | Discharge: 2019-04-08 | Disposition: A | Discharge status: Home / Self Care | Payer: Medicare HMO | Attending: Emergency Medicine | Admitting: Emergency Medicine

## 2019-04-08 ENCOUNTER — Emergency Department (HOSPITAL_COMMUNITY): Payer: Medicare HMO

## 2019-04-08 ENCOUNTER — Other Ambulatory Visit: Payer: Self-pay

## 2019-04-08 DIAGNOSIS — Y998 Other external cause status: Secondary | ICD-10-CM | POA: Insufficient documentation

## 2019-04-08 DIAGNOSIS — F17210 Nicotine dependence, cigarettes, uncomplicated: Secondary | ICD-10-CM | POA: Diagnosis not present

## 2019-04-08 DIAGNOSIS — F1210 Cannabis abuse, uncomplicated: Secondary | ICD-10-CM | POA: Insufficient documentation

## 2019-04-08 DIAGNOSIS — J984 Other disorders of lung: Secondary | ICD-10-CM | POA: Diagnosis not present

## 2019-04-08 DIAGNOSIS — M25511 Pain in right shoulder: Secondary | ICD-10-CM | POA: Diagnosis not present

## 2019-04-08 DIAGNOSIS — R079 Chest pain, unspecified: Secondary | ICD-10-CM | POA: Diagnosis not present

## 2019-04-08 DIAGNOSIS — Y92410 Unspecified street and highway as the place of occurrence of the external cause: Secondary | ICD-10-CM | POA: Diagnosis not present

## 2019-04-08 DIAGNOSIS — M25512 Pain in left shoulder: Secondary | ICD-10-CM | POA: Diagnosis not present

## 2019-04-08 DIAGNOSIS — Z79899 Other long term (current) drug therapy: Secondary | ICD-10-CM | POA: Insufficient documentation

## 2019-04-08 DIAGNOSIS — I10 Essential (primary) hypertension: Secondary | ICD-10-CM | POA: Diagnosis not present

## 2019-04-08 DIAGNOSIS — M7918 Myalgia, other site: Secondary | ICD-10-CM | POA: Diagnosis not present

## 2019-04-08 DIAGNOSIS — Y9389 Activity, other specified: Secondary | ICD-10-CM | POA: Insufficient documentation

## 2019-04-08 DIAGNOSIS — S4991XA Unspecified injury of right shoulder and upper arm, initial encounter: Secondary | ICD-10-CM | POA: Diagnosis not present

## 2019-04-08 DIAGNOSIS — S299XXA Unspecified injury of thorax, initial encounter: Secondary | ICD-10-CM | POA: Diagnosis not present

## 2019-04-08 MED ORDER — IBUPROFEN 800 MG PO TABS: 800.0000 mg | ORAL_TABLET | Freq: Three times a day (TID) | ORAL | 0 refills | Status: DC

## 2019-04-08 MED ORDER — METHOCARBAMOL 500 MG PO TABS: 1000.0000 mg | ORAL_TABLET | Freq: Four times a day (QID) | ORAL | 0 refills | Status: DC | PRN
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
## 2019-04-08 NOTE — Discharge Instructions (Addendum)
For breakthrough pain you may take Robaxin. Do not drink alcohol, drive or operate heavy machinery when taking Robaxin.  Please follow with your primary care doctor in the next 2 days for a check-up. They must obtain records for further management.   Do not hesitate to return to the Emergency Department for any new, worsening or concerning symptoms.

## 2019-04-08 NOTE — ED Notes (Signed)
Pt ambulatory to BR in triage

## 2019-05-30 ENCOUNTER — Other Ambulatory Visit: Payer: Self-pay | Admitting: Internal Medicine

## 2019-06-01 ENCOUNTER — Other Ambulatory Visit: Payer: Self-pay | Admitting: Internal Medicine

## 2019-06-01 MED ORDER — DULOXETINE HCL 30 MG PO CPEP: 30.0000 mg | ORAL_CAPSULE | Freq: Every day | ORAL | 2 refills | Status: DC

## 2019-06-26 ENCOUNTER — Other Ambulatory Visit: Payer: Self-pay | Admitting: Internal Medicine

## 2019-06-26 DIAGNOSIS — I10 Essential (primary) hypertension: Secondary | ICD-10-CM

## 2019-06-26 NOTE — Telephone Encounter (Signed)
Needs refill on lisinopril (PRINIVIL,ZESTRIL) 10 MG tablet lisinopril-hydrochlorothiazide (PRINZIDE,ZESTORETIC) 20-25 MG tablet  ;pt contact [facility] ([place]), [place] - [address]

## 2019-06-26 NOTE — Telephone Encounter (Signed)
Pt needs to have hypertension follow up appt with labs. Renal function concerning at last appt.

## 2019-06-28 ENCOUNTER — Other Ambulatory Visit: Payer: Self-pay | Admitting: Internal Medicine

## 2019-06-28 DIAGNOSIS — I10 Essential (primary) hypertension: Secondary | ICD-10-CM

## 2019-06-28 MED ORDER — LISINOPRIL 10 MG PO TABS: 10.0000 mg | ORAL_TABLET | Freq: Every day | ORAL | 3 refills | Status: DC

## 2019-06-28 MED ORDER — LISINOPRIL-HYDROCHLOROTHIAZIDE 20-25 MG PO TABS: 1.0000 | ORAL_TABLET | Freq: Every day | ORAL | 0 refills | Status: DC

## 2019-06-28 NOTE — Progress Notes (Unsigned)
   I got page on oncall pager from Jonathan [name] for refills of his anti HTN medications: Lisinopril and lisinopril-HCTZ. He is concern because he has been out of his medicines for 2 days and has not hear back form clinic. It appears to be appropriate refill per last PCP note.  I send refill and recommended top follow up for PCP.

## 2019-06-29 ENCOUNTER — Telehealth: Payer: Self-pay | Admitting: Internal Medicine

## 2019-06-29 NOTE — Telephone Encounter (Signed)
Needs refill on lisinopril-hydrochlorothiazide (ZESTORETIC) 20-25 MG tablet ;pt contact [facility] ([place]), [place] - [address]

## 2019-06-29 NOTE — Telephone Encounter (Signed)
Talked to pt - stated he's taking both Lisinopril and Lisinopril/HCTZ; stated d"oing what the doctor tells me to do". He does have an [facility] appt on Thursday 12/10.

## 2019-06-29 NOTE — Telephone Encounter (Signed)
Spoke with the pt.  He has sch an appt with [facility] on 07/02/2019 @ 9:15 am.

## 2019-06-29 NOTE — Telephone Encounter (Signed)
Called pt - stated he picked up Lisinopril 10 mg but not lisinopril-hydrochlorothiazide.   I called [facility] - they want to know if pt is on both medications? Informed per Dr Darcey Nora note "It appears to be appropriate refill per last PCP note."  They will get rx ready for pt.  Called pt - no answer; left message to call the office.

## 2019-07-02 ENCOUNTER — Encounter: Payer: Self-pay | Admitting: Internal Medicine

## 2019-07-02 ENCOUNTER — Other Ambulatory Visit: Payer: Self-pay

## 2019-07-02 ENCOUNTER — Ambulatory Visit: Payer: Medicare HMO

## 2019-07-02 ENCOUNTER — Ambulatory Visit (INDEPENDENT_AMBULATORY_CARE_PROVIDER_SITE_OTHER): Payer: Medicare HMO | Admitting: Internal Medicine

## 2019-07-02 VITALS — BP 124/71 | HR 66 | Temp 98.3°F | Ht 73.8 in | Wt 169.9 lb

## 2019-07-02 DIAGNOSIS — Z79899 Other long term (current) drug therapy: Secondary | ICD-10-CM | POA: Diagnosis not present

## 2019-07-02 DIAGNOSIS — I10 Essential (primary) hypertension: Secondary | ICD-10-CM | POA: Diagnosis not present

## 2019-07-02 NOTE — Progress Notes (Signed)
   CC: Hypertension follow-up  HPI:  [name] is a 63 y.o. year-old male with PMH listed below who presents to clinic for hypertension follow-up. Please see problem based assessment and plan for further details.   Past Medical History:  Diagnosis Date  . Allergy   . Anxiety   . Blood transfusion without reported diagnosis    as baby  . Chronic Bil Foot Pain 05/27/2011   Lt > Rt - since age 66 2/2 to post polio syndrome  Corns and callosities on both feet  Reconstruction surgery at age 23. Uses a brace on left leg for many years On prn tylenol for pain  No candidate for opiates due to active substance abuse Podiatry and PT (for brace eval) on 04/23/2013    . Chronic viral hepatitis C ([place]) 06/05/2006   History of blood transfusion in childhood Dx 2010 Reports history of Rx while in [place], unknown yr Elevated LFTs since 2010 and repeat 2014 U/S 2009 - no cirrhosis, repeat in 12/2013 >> normal.  Fibrosure >> pending results  Referred to Hep C clinic 04/23/2013 >> appt in 06/2013 Hep C clinic [phone] Immunity to hep A per labs at Hep C clinic  Started on Hep B series 1 st dose 02/03/2014, 2 dose given 04/23/2014. Last dose about 09/2014.  Follow with Hep C clinic       . ERECTILE DYSFUNCTION 06/05/2006   Qualifier: Diagnosis of  By: Grandville Silos MD, Quillian Quince    . Essential hypertension 06/05/2006   Dx in 2008  Has BP machine at home  Checks erratically  Chronic no show issues      . GERD (gastroesophageal reflux disease)    occ s/s  . Hypertension   . Polysubstance abuse ([place]) 04/23/2013   Cocaine (sniffs powder) and marijuana No IVDU   . POST-POLIO SYNDROME 06/05/2006   Hx of polio at age 23 Has spasms in his legs on and off Used Flexeril on and off for spasms 2015 May >> sent to rehab/PT. rec new leg braces/ortho     Review of Systems:   Review of Systems  Constitutional: Negative for chills, fever and weight loss.  Respiratory: Negative for shortness of breath.   Cardiovascular:  Negative for chest pain, palpitations and leg swelling.  Neurological: Negative for dizziness and headaches.    Physical Exam:  Vitals:   07/02/19 1033  BP: 124/71  Pulse: 66  Temp: 98.3 F (36.8 C)  TempSrc: Oral  SpO2: 100%  Weight: 169 lb 14.4 oz (77.1 kg)  Height: 6' 1.8" (1.875 m)    General: Well-appearing elderly male in no acute distress Cardiac: regular rate and rhythm, nl S1/S2, no murmurs, rubs or gallops Pulm: CTAB, no wheezes or crackles, no increased work of breathing on room air   Ext: warm and well perfused, no peripheral edema   Assessment & Plan:   See Encounters Tab for problem based charting.  Patient discussed with Dr. Dareen Piano

## 2019-07-02 NOTE — Patient Instructions (Signed)
Mr. Treichler,   Your blood pressure looks excellent today.  Continue taking lisinopril and the combination pill lisinopril/hydrochlorothiazide.  We will check your kidneys to make sure they are doing okay.   Follow-up with your regular doctor, Dr. Darrick Meigs, in 6 months. You can also make an appointment before then if you need to.    Call us if you have any questions or concerns.  - Dr. Frederico Hamman

## 2019-07-02 NOTE — Progress Notes (Signed)
Internal Medicine Clinic Attending  Case discussed with Dr. Santos-Sanchez at the time of the visit.  We reviewed the resident's history and exam and pertinent patient test results.  I agree with the assessment, diagnosis, and plan of care documented in the resident's note.    

## 2019-07-02 NOTE — Assessment & Plan Note (Signed)
Patient presents for HTN follow-up.  He is on lisinopril 10 mg and lisinopril-HCTZ 20-25 daily. BP is at goal. Presents after pharmacist asked him if he is supposed to be on both medications due to nephrotoxic effects. Discussed with patient he is on a safe dose of both medications and can continue taking them as he usually does. Ordered BMP to check renal function. Will follow.

## 2019-07-03 LAB — BMP8+ANION GAP
Anion Gap: 14 mmol/L (ref 10.0–18.0)
BUN/Creatinine Ratio: 12 (ref 10–24)
BUN: 18 mg/dL (ref 8–27)
CO2: 25 mmol/L (ref 20–29)
Calcium: 9.9 mg/dL (ref 8.6–10.2)
Chloride: 105 mmol/L (ref 96–106)
Creatinine, Ser: 1.5 mg/dL — ABNORMAL HIGH (ref 0.76–1.27)
GFR calc Af Amer: 57 mL/min/{1.73_m2} — ABNORMAL LOW (ref >59)
GFR calc non Af Amer: 50 mL/min/{1.73_m2} — ABNORMAL LOW (ref >59)
Glucose: 81 mg/dL (ref 65–99)
Potassium: 4.3 mmol/L (ref 3.5–5.2)
Sodium: 144 mmol/L (ref 134–144)

## 2019-07-13 ENCOUNTER — Telehealth: Payer: Self-pay | Admitting: Internal Medicine

## 2019-07-13 DIAGNOSIS — I10 Essential (primary) hypertension: Secondary | ICD-10-CM

## 2019-07-13 NOTE — Telephone Encounter (Signed)
Refill Request  amLODipine (NORVASC) 10 MG tablet(Expired)   [id number] - [place] ([place]), [place] - [address]

## 2019-07-14 NOTE — Telephone Encounter (Signed)
According to chart review, appears patient has been managed on lisinopril and hydrochlorothiazide combo pill since his last office visit.

## 2019-07-15 NOTE — Telephone Encounter (Signed)
That sounds good. Thank you. 

## 2019-07-15 NOTE — Telephone Encounter (Signed)
Call from pt asking why Amlodipine rx was not refilled. Informed according to last visit on 12/10 , his BP was ok on Lisinopril and Lisinopril-hydrochlorothiazide. Stated he was also taking Amlodipine and only ran out about 1 week ago. But stated his BP has been ok off Amlodipine (he could not tell me any readings). Pt stated he's able to check his BP at home; informed since he has an appt in Jan, to keep a record and if BP starts to increase, to call the office. Stated he will.

## 2019-08-04 ENCOUNTER — Encounter: Payer: Medicare HMO | Admitting: Internal Medicine

## 2019-08-11 ENCOUNTER — Encounter: Payer: Medicare HMO | Admitting: Internal Medicine

## 2019-08-30 ENCOUNTER — Other Ambulatory Visit: Payer: Self-pay | Admitting: Internal Medicine

## 2019-09-19 ENCOUNTER — Other Ambulatory Visit: Payer: Self-pay | Admitting: Internal Medicine

## 2019-09-19 DIAGNOSIS — I10 Essential (primary) hypertension: Secondary | ICD-10-CM

## 2019-11-13 ENCOUNTER — Ambulatory Visit (INDEPENDENT_AMBULATORY_CARE_PROVIDER_SITE_OTHER): Payer: Medicare HMO | Admitting: Internal Medicine

## 2019-11-13 VITALS — BP 153/99 | HR 60 | Temp 97.7°F | Ht 73.0 in | Wt 179.1 lb

## 2019-11-13 DIAGNOSIS — M2042 Other hammer toe(s) (acquired), left foot: Secondary | ICD-10-CM | POA: Diagnosis not present

## 2019-11-13 DIAGNOSIS — R21 Rash and other nonspecific skin eruption: Secondary | ICD-10-CM

## 2019-11-13 NOTE — Patient Instructions (Signed)
Jonathan [name], It was great meeting you!  I have placed the two referrals you requested, to podiatry and dermatology. Please let us know if there is anything else we can help with.   Take care, Dr. Koleen Distance

## 2019-11-19 ENCOUNTER — Encounter: Payer: Self-pay | Admitting: Internal Medicine

## 2019-11-19 DIAGNOSIS — R21 Rash and other nonspecific skin eruption: Secondary | ICD-10-CM | POA: Insufficient documentation

## 2019-11-19 DIAGNOSIS — M2042 Other hammer toe(s) (acquired), left foot: Secondary | ICD-10-CM | POA: Insufficient documentation

## 2019-11-19 NOTE — Assessment & Plan Note (Signed)
Patient has diffuse hammer toes on left foot as a result of polio. He has developed painful clavi on the dorsal aspect of his toes which make it difficult for him to wear shoes and walk. Will place referral to podiatry for further evaluation and treatment.

## 2019-11-19 NOTE — Progress Notes (Signed)
Acute Office Visit  Subjective:    Patient ID: Jonathan [name], male    DOB: 06-Jan-1956, 64 y.o.   MRN: [id number]  Chief Complaint  Patient presents with  . Bunions    feet problems, discolarition of skin on his head  . Hypertension    HPI Patient is in today for referrals to dermatology and podiatry. Please see problem based charting for further details.   Past Medical History:  Diagnosis Date  . Allergy   . Anxiety   . Blood transfusion without reported diagnosis    as baby  . Chronic Bil Foot Pain 05/27/2011   Lt > Rt - since age 38 2/2 to post polio syndrome  Corns and callosities on both feet  Reconstruction surgery at age 25. Uses a brace on left leg for many years On prn tylenol for pain  No candidate for opiates due to active substance abuse Podiatry and PT (for brace eval) on 04/23/2013    . Chronic viral hepatitis C ([place]) 06/05/2006   History of blood transfusion in childhood Dx 2010 Reports history of Rx while in [place], unknown yr Elevated LFTs since 2010 and repeat 2014 U/S 2009 - no cirrhosis, repeat in 12/2013 >> normal.  Fibrosure >> pending results  Referred to Hep C clinic 04/23/2013 >> appt in 06/2013 Hep C clinic [phone] Immunity to hep A per labs at Hep C clinic  Started on Hep B series 1 st dose 02/03/2014, 2 dose given 04/23/2014. Last dose about 09/2014.  Follow with Hep C clinic       . ERECTILE DYSFUNCTION 06/05/2006   Qualifier: Diagnosis of  By: Grandville Silos MD, Quillian Quince    . Essential hypertension 06/05/2006   Dx in 2008  Has BP machine at home  Checks erratically  Chronic no show issues      . GERD (gastroesophageal reflux disease)    occ s/s  . Hypertension   . Polysubstance abuse ([place]) 04/23/2013   Cocaine (sniffs powder) and marijuana No IVDU   . POST-POLIO SYNDROME 06/05/2006   Hx of polio at age 65 Has spasms in his legs on and off Used Flexeril on and off for spasms 2015 May >> sent to rehab/PT. rec new leg braces/ortho      Past Surgical History:   Procedure Laterality Date  . COLONOSCOPY    . FRACTURE SURGERY    . KNEE ARTHROSCOPY Right     Family History  Problem Relation Age of Onset  . Cancer Mother   . Breast cancer Mother   . Colon cancer Neg Hx   . Colon polyps Neg Hx     Social History   Socioeconomic History  . Marital status: Single    Spouse name: Not on file  . Number of children: Not on file  . Years of education: Not on file  . Highest education level: Not on file  Occupational History  . Not on file  Tobacco Use  . Smoking status: Current Every Day Smoker    Packs/day: 0.50    Years: 35.00    Pack years: 17.50    Types: Cigarettes  . Smokeless tobacco: Never Used  Substance and Sexual Activity  . Alcohol use: Yes    Alcohol/week: 21.0 standard drinks    Types: 21 Standard drinks or equivalent per week    Comment: beer everyday  . Drug use: Yes    Types: Marijuana    Comment: marijuana  . Sexual activity: Not on file  Other Topics Concern  . Not on file  Social History Narrative  . Not on file   Social Determinants of Health   Financial Resource Strain:   . Difficulty of Paying Living Expenses:   Food Insecurity:   . Worried About Charity fundraiser in the Last Year:   . Arboriculturist in the Last Year:   Transportation Needs:   . Film/video editor (Medical):   Marland Kitchen Lack of Transportation (Non-Medical):   Physical Activity:   . Days of Exercise per Week:   . Minutes of Exercise per Session:   Stress:   . Feeling of Stress :   Social Connections:   . Frequency of Communication with Friends and Family:   . Frequency of Social Gatherings with Friends and Family:   . Attends Religious Services:   . Active Member of Clubs or Organizations:   . Attends Archivist Meetings:   Marland Kitchen Marital Status:   Intimate Partner Violence:   . Fear of Current or Ex-Partner:   . Emotionally Abused:   Marland Kitchen Physically Abused:   . Sexually Abused:     Outpatient Medications Prior to Visit   Medication Sig Dispense Refill  . acetaminophen (TYLENOL) 325 MG tablet Take 650 mg by mouth every 6 (six) hours as needed.    Marland Kitchen amLODipine (NORVASC) 10 MG tablet Take 1 tablet (10 mg total) by mouth daily. 90 tablet 3  . aspirin-acetaminophen-caffeine (EXCEDRIN MIGRAINE) [id number] MG tablet Take by mouth every 6 (six) hours as needed for headache.    Marland Kitchen atorvastatin (LIPITOR) 20 MG tablet Take 1 tablet (20 mg total) by mouth daily. 90 tablet 3  . diclofenac sodium (VOLTAREN) 1 % GEL Apply 2 g topically 4 (four) times daily. 100 g 2  . doxycycline (VIBRAMYCIN) 100 MG capsule Take 1 capsule (100 mg total) by mouth 2 (two) times daily. 20 capsule 0  . DULoxetine (CYMBALTA) 30 MG capsule Take 1 capsule by mouth once daily 30 capsule 5  . gabapentin (NEURONTIN) 300 MG capsule Take 1 capsule (300 mg total) by mouth at bedtime. 30 capsule 2  . ibuprofen (ADVIL) 800 MG tablet Take 1 tablet (800 mg total) by mouth 3 (three) times daily. 21 tablet 0  . lisinopril (ZESTRIL) 10 MG tablet Take 1 tablet (10 mg total) by mouth daily. 90 tablet 3  . lisinopril-hydrochlorothiazide (ZESTORETIC) 20-25 MG tablet Take 1 tablet by mouth once daily 90 tablet 0  . methocarbamol (ROBAXIN) 500 MG tablet Take 2 tablets (1,000 mg total) by mouth 4 (four) times daily as needed (Pain). 20 tablet 0   No facility-administered medications prior to visit.    No Known Allergies  Review of Systems  Constitutional: Negative for activity change, chills and fever.  Musculoskeletal: Negative for gait problem and joint swelling.  Skin: Positive for color change. Negative for rash and wound.  Neurological: Negative for weakness, numbness and headaches.       Objective:    Physical Exam Constitutional:      General: He is not in acute distress. Feet:     Comments: Hammer toes on left foot as a result of Polio. Also has several clavi on the dorsal aspect of several toes that are painful.  Skin:    Comments: Patches of  hypopigmentation throughout scalp and extending down into neck   Neurological:     Mental Status: He is alert.     BP (!) 153/99 (BP Location: Right Arm, Patient Position:  Sitting, Cuff Size: Small)   Pulse 60   Temp 97.7 F (36.5 C) (Oral)   Ht 6\' 1"  (1.854 m)   Wt 179 lb 1.6 oz (81.2 kg)   SpO2 100%   BMI 23.63 kg/m  Wt Readings from Last 3 Encounters:  11/13/19 179 lb 1.6 oz (81.2 kg)  07/02/19 169 lb 14.4 oz (77.1 kg)  02/25/19 167 lb 1.6 oz (75.8 kg)    Health Maintenance Due  Topic Date Due  . COVID-19 Vaccine (1) Never done    There are no preventive care reminders to display for this patient.   Lab Results  Component Value Date   TSH 1.717 11/02/2010   Lab Results  Component Value Date   WBC 4.1 12/16/2015   HGB 13.4 12/16/2015   HCT 39.4 12/16/2015   MCV 92 12/16/2015   PLT 276 12/16/2015   Lab Results  Component Value Date   NA 144 07/02/2019   K 4.3 07/02/2019   CO2 25 07/02/2019   GLUCOSE 81 07/02/2019   BUN 18 07/02/2019   CREATININE 1.50 (H) 07/02/2019   BILITOT 0.6 08/29/2017   ALKPHOS 74 08/29/2017   AST 19 08/29/2017   ALT 13 08/29/2017   PROT 7.0 08/29/2017   ALBUMIN 4.5 08/29/2017   CALCIUM 9.9 07/02/2019   Lab Results  Component Value Date   CHOL 120 01/22/2019   Lab Results  Component Value Date   HDL 69 01/22/2019   Lab Results  Component Value Date   LDLCALC 31 01/22/2019   Lab Results  Component Value Date   TRIG 102 01/22/2019   Lab Results  Component Value Date   CHOLHDL 1.7 01/22/2019   Lab Results  Component Value Date   HGBA1C 5.0 08/29/2017       Assessment & Plan:   Problem List Items Addressed This Visit      Musculoskeletal and Integument   Hammer toe of left foot    Patient has diffuse hammer toes on left foot as a result of polio. He has developed painful clavi on the dorsal aspect of his toes which make it difficult for him to wear shoes and walk. Will place referral to podiatry for further  evaluation and treatment.       Relevant Orders   Ambulatory referral to Podiatry   Rash of face - Primary    Patient presents with patchy hypopigmentation of his scalp which has slowly progressed over the last couple of years. He has not noticed if it gets worse with sunlight exposure. It is not painful or pruritic, but seems to be a cosmetic concern for him. He would like a referral to dermatology prior to trying anything like an antifungal cream. Presentation most consistent with vitiligo vs tinea versicolor. Will refer to dermatology for further eval and management.       Relevant Orders   Ambulatory referral to Dermatology       No orders of the defined types were placed in this encounter.    Delice Bison, DO

## 2019-11-19 NOTE — Assessment & Plan Note (Signed)
Patient presents with patchy hypopigmentation of his scalp which has slowly progressed over the last couple of years. He has not noticed if it gets worse with sunlight exposure. It is not painful or pruritic, but seems to be a cosmetic concern for him. He would like a referral to dermatology prior to trying anything like an antifungal cream. Presentation most consistent with vitiligo vs tinea versicolor. Will refer to dermatology for further eval and management.

## 2019-11-20 NOTE — Progress Notes (Signed)
Internal Medicine Clinic Attending  Case discussed with Dr. Bloomfield at the time of the visit.  We reviewed the resident's history and exam and pertinent patient test results.  I agree with the assessment, diagnosis, and plan of care documented in the resident's note.  

## 2019-12-03 ENCOUNTER — Other Ambulatory Visit: Payer: Self-pay

## 2019-12-03 ENCOUNTER — Ambulatory Visit (INDEPENDENT_AMBULATORY_CARE_PROVIDER_SITE_OTHER): Payer: Medicare HMO | Admitting: Podiatry

## 2019-12-03 VITALS — Temp 96.4°F

## 2019-12-03 DIAGNOSIS — M7750 Other enthesopathy of unspecified foot: Secondary | ICD-10-CM

## 2019-12-03 DIAGNOSIS — M2011 Hallux valgus (acquired), right foot: Secondary | ICD-10-CM

## 2019-12-03 DIAGNOSIS — M2042 Other hammer toe(s) (acquired), left foot: Secondary | ICD-10-CM

## 2019-12-03 DIAGNOSIS — M2012 Hallux valgus (acquired), left foot: Secondary | ICD-10-CM | POA: Diagnosis not present

## 2019-12-03 DIAGNOSIS — M2041 Other hammer toe(s) (acquired), right foot: Secondary | ICD-10-CM | POA: Diagnosis not present

## 2019-12-29 ENCOUNTER — Other Ambulatory Visit: Payer: Self-pay | Admitting: Internal Medicine

## 2019-12-29 DIAGNOSIS — I10 Essential (primary) hypertension: Secondary | ICD-10-CM

## 2019-12-31 ENCOUNTER — Other Ambulatory Visit: Payer: Self-pay | Admitting: *Deleted

## 2019-12-31 DIAGNOSIS — I10 Essential (primary) hypertension: Secondary | ICD-10-CM

## 2020-01-01 MED ORDER — LISINOPRIL 10 MG PO TABS: 10.0000 mg | ORAL_TABLET | Freq: Every day | ORAL | 1 refills | Status: DC

## 2020-01-01 NOTE — Telephone Encounter (Signed)
Please have patient come in for blood pressure recheck. Thank you

## 2020-01-04 ENCOUNTER — Other Ambulatory Visit: Payer: Self-pay | Admitting: Internal Medicine

## 2020-01-04 DIAGNOSIS — I10 Essential (primary) hypertension: Secondary | ICD-10-CM

## 2020-01-04 NOTE — Telephone Encounter (Signed)
Per pt pharm was not filling both his meds for bp, called pharm spoke to pharmacist, she will get the 20/25 ready Called pt and informed him, sch appt [facility] 6/17 for f/u BP and handicapped plate

## 2020-01-04 NOTE — Telephone Encounter (Signed)
Need refill on blood pressure medicine  ;pt contact [facility] ([place]), [place] - [address]   Pt is completely out

## 2020-01-06 MED ORDER — AMLODIPINE BESYLATE 10 MG PO TABS: 10.0000 mg | ORAL_TABLET | Freq: Every day | ORAL | 3 refills | Status: DC

## 2020-01-07 ENCOUNTER — Encounter: Payer: Self-pay | Admitting: Internal Medicine

## 2020-01-07 ENCOUNTER — Ambulatory Visit (INDEPENDENT_AMBULATORY_CARE_PROVIDER_SITE_OTHER): Payer: Medicare HMO | Admitting: Internal Medicine

## 2020-01-07 VITALS — BP 163/109 | HR 56 | Temp 98.4°F | Wt 170.6 lb

## 2020-01-07 DIAGNOSIS — F17210 Nicotine dependence, cigarettes, uncomplicated: Secondary | ICD-10-CM

## 2020-01-07 DIAGNOSIS — I10 Essential (primary) hypertension: Secondary | ICD-10-CM | POA: Diagnosis not present

## 2020-01-07 DIAGNOSIS — Z0000 Encounter for general adult medical examination without abnormal findings: Secondary | ICD-10-CM

## 2020-01-07 DIAGNOSIS — R21 Rash and other nonspecific skin eruption: Secondary | ICD-10-CM

## 2020-01-07 DIAGNOSIS — K5900 Constipation, unspecified: Secondary | ICD-10-CM

## 2020-01-07 MED ORDER — BISACODYL 5 MG PO TBEC: 5.0000 mg | DELAYED_RELEASE_TABLET | Freq: Every day | ORAL | 2 refills | Status: DC | PRN

## 2020-01-07 NOTE — Assessment & Plan Note (Addendum)
Patient states that he has not had his antihypertensive for a week due to a mixup at his pharmacy.  He is normally on amlodipine 10 mg daily, lisinopril-HCTZ 20-25 mg daily, and lisinopril 10 mg daily.  His blood pressure has previously been well controlled, but today is elevated to 151/101 and 163/109.  He says that the mixup has been fixed and he has been taking his medications for the past 3 days straight.  I anticipate that his elevated BPs are likely due to the fact that he did not have them for a week.  I suspect it will be well controlled once he is on a consistent regimen again.  Plan: -Continue amlodipine 10 mg daily, lisinopril-HCTZ 20-25, and lisinopril 10 mg daily --BMP today  -Follow-up in 1 month

## 2020-01-07 NOTE — Assessment & Plan Note (Signed)
Patient wanted to know more about the Covid vaccine today.  We discussed the different vaccines out there with the risks and benefits are.  Patient states that he is going to get his vaccine later today.

## 2020-01-07 NOTE — Progress Notes (Signed)
Internal Medicine Clinic Attending  Case discussed with Dr. Alexander at the time of the visit.  We reviewed the resident's history and exam and pertinent patient test results.  I agree with the assessment, diagnosis, and plan of care documented in the resident's note.  

## 2020-01-07 NOTE — Assessment & Plan Note (Signed)
Patient has a patchy hypopigmented the rash upon the scalp and neck that is not painful or pruritic.  He states that he would like a dermatology referral so he can set up an appointment.  We have offered prescribing some antifungal medication, but the patient states he would rather see a dermatologist first.  Plan: -Dermatology referral ordered

## 2020-01-07 NOTE — Assessment & Plan Note (Signed)
Patient states that he smokes a half a pack of cigarettes daily.  He has thought about cutting back, but has been unsuccessful.  He has been smoking for over 40 years.  Plan: -Smoking cessation counseling performed -Discuss at next visit

## 2020-01-07 NOTE — Progress Notes (Signed)
CC: HTN follow up  HPI: [name] is a 64 y.o. with a hx as noted below who presents for HTN follow up. Please refer to the problem based charting for further details.  Past Medical History:  Diagnosis Date  . Allergy   . Anxiety   . Blood transfusion without reported diagnosis    as baby  . Chronic Bil Foot Pain 05/27/2011   Lt > Rt - since age 18 2/2 to post polio syndrome  Corns and callosities on both feet  Reconstruction surgery at age 76. Uses a brace on left leg for many years On prn tylenol for pain  No candidate for opiates due to active substance abuse Podiatry and PT (for brace eval) on 04/23/2013    . Chronic viral hepatitis C ([place]) 06/05/2006   History of blood transfusion in childhood Dx 2010 Reports history of Rx while in [place], unknown yr Elevated LFTs since 2010 and repeat 2014 U/S 2009 - no cirrhosis, repeat in 12/2013 >> normal.  Fibrosure >> pending results  Referred to Hep C clinic 04/23/2013 >> appt in 06/2013 Hep C clinic [phone] Immunity to hep A per labs at Hep C clinic  Started on Hep B series 1 st dose 02/03/2014, 2 dose given 04/23/2014. Last dose about 09/2014.  Follow with Hep C clinic       . ERECTILE DYSFUNCTION 06/05/2006   Qualifier: Diagnosis of  By: Grandville Silos MD, Quillian Quince    . Essential hypertension 06/05/2006   Dx in 2008  Has BP machine at home  Checks erratically  Chronic no show issues      . GERD (gastroesophageal reflux disease)    occ s/s  . Hypertension   . Polysubstance abuse ([place]) 04/23/2013   Cocaine (sniffs powder) and marijuana No IVDU   . POST-POLIO SYNDROME 06/05/2006   Hx of polio at age 89 Has spasms in his legs on and off Used Flexeril on and off for spasms 2015 May >> sent to rehab/PT. rec new leg braces/ortho     Review of Systems:  Negative unless mentioned in the HPI  Physical Exam: Vitals:   01/07/20 1010 01/07/20 1042  BP: (!) 151/101 (!) 163/109  Pulse: 65 (!) 56  Temp: 98.4 F (36.9 C)   TempSrc: Oral   SpO2:  100%   Weight: 170 lb 9.6 oz (77.4 kg)    Physical Exam Vitals reviewed.  Constitutional:      General: He is not in acute distress.    Appearance: Normal appearance. He is normal weight. He is not ill-appearing, toxic-appearing or diaphoretic.  HENT:     Head: Normocephalic and atraumatic.  Cardiovascular:     Rate and Rhythm: Normal rate and regular rhythm.     Pulses: Normal pulses.     Heart sounds: Normal heart sounds. No murmur heard.  No friction rub. No gallop.   Pulmonary:     Effort: Pulmonary effort is normal. No respiratory distress.     Breath sounds: Normal breath sounds. No wheezing or rales.  Skin:    Findings: Rash present.     Comments: Hypopigmented, non puritic rash upon the crown of the patient's head and neck  Neurological:     General: No focal deficit present.     Mental Status: He is alert.  Psychiatric:        Mood and Affect: Mood normal.    Assessment & Plan:   See Encounters Tab for problem based charting.  Patient discussed  with Dr. Philipp Ovens

## 2020-01-07 NOTE — Patient Instructions (Signed)
Thank you for visiting [place] in clinic today.  Below is a summary of what we discussed:  1.  Hypertension -Continue taking amlodipine 10 mg daily, lisinopril-HCTZ 20-25 and lisinopril 10 mg daily -Please come back in 1 month to recheck your blood pressures. -We also got blood work to check your kidney function  2.  Covid vaccine -We recommend that you get your Covid vaccine as soon as possible.  3.  Rash -We have provided a dermatology referral for you.  You can call the number below to schedule an appointment  4.  Follow-up -Please schedule an appointment in 1 month to follow-up on your blood pressures.

## 2020-01-08 LAB — BMP8+ANION GAP
Anion Gap: 15 mmol/L (ref 10.0–18.0)
BUN/Creatinine Ratio: 10 (ref 10–24)
BUN: 14 mg/dL (ref 8–27)
CO2: 22 mmol/L (ref 20–29)
Calcium: 9.6 mg/dL (ref 8.6–10.2)
Chloride: 106 mmol/L (ref 96–106)
Creatinine, Ser: 1.34 mg/dL — ABNORMAL HIGH (ref 0.76–1.27)
GFR calc Af Amer: 65 mL/min/{1.73_m2} (ref >59)
GFR calc non Af Amer: 56 mL/min/{1.73_m2} — ABNORMAL LOW (ref >59)
Glucose: 95 mg/dL (ref 65–99)
Potassium: 3.9 mmol/L (ref 3.5–5.2)
Sodium: 143 mmol/L (ref 134–144)

## 2020-01-14 ENCOUNTER — Ambulatory Visit (INDEPENDENT_AMBULATORY_CARE_PROVIDER_SITE_OTHER): Payer: Medicare HMO | Admitting: Internal Medicine

## 2020-01-14 ENCOUNTER — Other Ambulatory Visit: Payer: Self-pay

## 2020-01-14 DIAGNOSIS — I10 Essential (primary) hypertension: Secondary | ICD-10-CM | POA: Diagnosis not present

## 2020-01-14 NOTE — Patient Instructions (Signed)
It was nice seeing you today! Thank you for choosing [facility] for your Primary Care.    Today we talked about:   1. Blood Pressure   - Continue taking the combination pill (Lisinopril-HCTZ 20-25) daily - Start taking Amlodipine 10 mg daily.  - Stop taking Lisinopril 10 mg

## 2020-01-17 NOTE — Progress Notes (Signed)
CC: HTN follow up  HPI:  Mr.Brenyn L Saiki is a 64 y.o. with a PMHx as listed below who presents to the clinic for HTN follow up.   Please see the Encounters tab for problem-based Assessment & Plan regarding status of patient's chronic conditions.  Past Medical History:  Diagnosis Date  . Allergy   . Anxiety   . Blood transfusion without reported diagnosis    as baby  . Chronic Bil Foot Pain 05/27/2011   Lt > Rt - since age 26 2/2 to post polio syndrome  Corns and callosities on both feet  Reconstruction surgery at age 14. Uses a brace on left leg for many years On prn tylenol for pain  No candidate for opiates due to active substance abuse Podiatry and PT (for brace eval) on 04/23/2013    . Chronic viral hepatitis C ([place]) 06/05/2006   History of blood transfusion in childhood Dx 2010 Reports history of Rx while in [place], unknown yr Elevated LFTs since 2010 and repeat 2014 U/S 2009 - no cirrhosis, repeat in 12/2013 >> normal.  Fibrosure >> pending results  Referred to Hep C clinic 04/23/2013 >> appt in 06/2013 Hep C clinic [phone] Immunity to hep A per labs at Hep C clinic  Started on Hep B series 1 st dose 02/03/2014, 2 dose given 04/23/2014. Last dose about 09/2014.  Follow with Hep C clinic       . ERECTILE DYSFUNCTION 06/05/2006   Qualifier: Diagnosis of  By: Grandville Silos MD, Quillian Quince    . Essential hypertension 06/05/2006   Dx in 2008  Has BP machine at home  Checks erratically  Chronic no show issues      . GERD (gastroesophageal reflux disease)    occ s/s  . Hypertension   . Polysubstance abuse ([place]) 04/23/2013   Cocaine (sniffs powder) and marijuana No IVDU   . POST-POLIO SYNDROME 06/05/2006   Hx of polio at age 19 Has spasms in his legs on and off Used Flexeril on and off for spasms 2015 May >> sent to rehab/PT. rec new leg braces/ortho     Review of Systems: Review of Systems  Constitutional: Negative for chills and fever.  Respiratory: Negative for cough, shortness of breath  and wheezing.   Cardiovascular: Negative for chest pain, palpitations, claudication and leg swelling.  Gastrointestinal: Positive for constipation (chronic). Negative for abdominal pain, diarrhea, nausea and vomiting.  Neurological: Negative for dizziness and headaches.   Physical Exam:  Vitals:   01/14/20 0949 01/14/20 0954  BP:  140/86  Pulse:  (!) 56  Temp:  98 F (36.7 C)  TempSrc:  Oral  SpO2:  100%  Weight: 174 lb 4.8 oz (79.1 kg)   Height: 6' 1.5" (1.867 m)    Physical Exam Vitals and nursing note reviewed.  Constitutional:      General: He is not in acute distress.    Appearance: He is normal weight.  Cardiovascular:     Rate and Rhythm: Normal rate and regular rhythm.     Heart sounds: No murmur heard.  No gallop.   Pulmonary:     Effort: Pulmonary effort is normal. No respiratory distress.     Breath sounds: No wheezing.  Skin:    General: Skin is warm and dry.  Neurological:     General: No focal deficit present.     Mental Status: He is alert and oriented to person, place, and time. Mental status is at baseline.  Psychiatric:  Mood and Affect: Mood normal.        Behavior: Behavior normal.     Assessment & Plan:   See Encounters Tab for problem based charting.  Patient discussed with Dr. Rebeca Alert

## 2020-01-17 NOTE — Assessment & Plan Note (Addendum)
BP: 140/86   [name] presents today for hypertension follow-up.  At his last visit, he was instructed to continue amlodipine 10 mg daily, lisinopril-HCTZ 20-25 mg daily and lisinopril 10 mg daily.  He states he has picked up his amlodipine 10 mg, however has not started taking it yet.  He is taking the lisinopril combo pill and this lisinopril pill without any difficulty though.  Denies any chest pain, palpitations, dizziness, headache.  Assessment/plan: Blood pressure significantly improved today since he has resumed his lisinopril pills.  To simplify his current regimen, will instruct patient to start amlodipine 10 mg and continue lisinopril combination pill, however to discontinue the lisinopril 10 mg daily.  Patient is in agreement with plan.  -Amlodipine 10 mg daily -Lisinopril HCTZ 20-25 mg -1 month follow-up

## 2020-01-18 NOTE — Progress Notes (Signed)
Internal Medicine Clinic Attending  Case discussed with Dr. Basaraba at the time of the visit.  We reviewed the resident's history and exam and pertinent patient test results.  I agree with the assessment, diagnosis, and plan of care documented in the resident's note.  Delsie Amador, M.D., Ph.D.  

## 2020-01-26 DIAGNOSIS — L80 Vitiligo: Secondary | ICD-10-CM | POA: Diagnosis not present

## 2020-02-03 NOTE — Progress Notes (Signed)
  Subjective:  Patient ID: Jonathan [name], male    DOB: 09/08/1955,  MRN: [id number]  Chief Complaint  Patient presents with  . Callouses    Bilateral plantar forefoot and hallux, L second and 5th toes. xYears. Pt stated, "I had polio that affected my L foot. Had reconstructive surgery. I have bunions and hammertoes, but I'm not interested in more surgery".    64 y.o. male presents with the above complaint. History confirmed with patient.   Objective:  Physical Exam: warm, good capillary refill, no trophic changes or ulcerative lesions, normal DP and PT pulses and normal sensory exam. HAV bilat. HPK submet 2, left hallux bilat 5th toes  Assessment:   1. Capsulitis of metatarsophalangeal (MTP) joint   2. Acquired hallux valgus of both feet   3. Hammer toes of both feet      Plan:  Patient was evaluated and treated and all questions answered.  Bunion and Hammertoe -Calluses debrided, courtesy -Recommend revitaderm cream -Dispense toe spacer.   Return if symptoms worsen or fail to improve.

## 2020-02-17 ENCOUNTER — Other Ambulatory Visit: Payer: Self-pay | Admitting: Internal Medicine

## 2020-05-03 ENCOUNTER — Ambulatory Visit: Payer: Medicare HMO | Admitting: Physician Assistant

## 2020-05-20 ENCOUNTER — Other Ambulatory Visit: Payer: Self-pay | Admitting: Internal Medicine

## 2020-05-26 ENCOUNTER — Other Ambulatory Visit: Payer: Self-pay | Admitting: Internal Medicine

## 2020-05-26 DIAGNOSIS — I10 Essential (primary) hypertension: Secondary | ICD-10-CM

## 2020-05-26 NOTE — Telephone Encounter (Signed)
Next appt scheduled 12/1 with PCP.

## 2020-05-29 ENCOUNTER — Other Ambulatory Visit: Payer: Self-pay | Admitting: Internal Medicine

## 2020-05-29 DIAGNOSIS — I10 Essential (primary) hypertension: Secondary | ICD-10-CM

## 2020-06-22 ENCOUNTER — Ambulatory Visit (INDEPENDENT_AMBULATORY_CARE_PROVIDER_SITE_OTHER): Payer: Medicare HMO | Admitting: Internal Medicine

## 2020-06-22 VITALS — BP 125/73 | HR 91 | Temp 98.2°F | Wt 178.8 lb

## 2020-06-22 DIAGNOSIS — F17200 Nicotine dependence, unspecified, uncomplicated: Secondary | ICD-10-CM

## 2020-06-22 DIAGNOSIS — Z114 Encounter for screening for human immunodeficiency virus [HIV]: Secondary | ICD-10-CM | POA: Diagnosis not present

## 2020-06-22 DIAGNOSIS — I10 Essential (primary) hypertension: Secondary | ICD-10-CM

## 2020-06-22 DIAGNOSIS — D649 Anemia, unspecified: Secondary | ICD-10-CM | POA: Diagnosis not present

## 2020-06-22 DIAGNOSIS — Z0000 Encounter for general adult medical examination without abnormal findings: Secondary | ICD-10-CM | POA: Diagnosis not present

## 2020-06-22 DIAGNOSIS — Z23 Encounter for immunization: Secondary | ICD-10-CM | POA: Diagnosis not present

## 2020-06-22 DIAGNOSIS — M79604 Pain in right leg: Secondary | ICD-10-CM

## 2020-06-22 DIAGNOSIS — N1831 Chronic kidney disease, stage 3a: Secondary | ICD-10-CM

## 2020-06-22 DIAGNOSIS — Z7251 High risk heterosexual behavior: Secondary | ICD-10-CM

## 2020-06-22 NOTE — Patient Instructions (Signed)
You can try silicone toe spacers to prevent your toes from rubbing together and forming a calus.
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
## 2020-06-23 ENCOUNTER — Encounter: Payer: Self-pay | Admitting: Internal Medicine

## 2020-06-23 DIAGNOSIS — F17200 Nicotine dependence, unspecified, uncomplicated: Secondary | ICD-10-CM | POA: Insufficient documentation

## 2020-06-23 DIAGNOSIS — M79604 Pain in right leg: Secondary | ICD-10-CM | POA: Insufficient documentation

## 2020-06-23 LAB — CMP14 + ANION GAP
ALT: 16 IU/L (ref 0–44)
AST: 20 IU/L (ref 0–40)
Albumin/Globulin Ratio: 1.7 (ref 1.2–2.2)
Albumin: 4.3 g/dL (ref 3.8–4.8)
Alkaline Phosphatase: 69 IU/L (ref 44–121)
Anion Gap: 13 mmol/L (ref 10.0–18.0)
BUN/Creatinine Ratio: 16 (ref 10–24)
BUN: 24 mg/dL (ref 8–27)
Bilirubin Total: <0.2 mg/dL (ref 0.0–1.2)
CO2: 23 mmol/L (ref 20–29)
Calcium: 9.9 mg/dL (ref 8.6–10.2)
Chloride: 110 mmol/L — ABNORMAL HIGH (ref 96–106)
Creatinine, Ser: 1.49 mg/dL — ABNORMAL HIGH (ref 0.76–1.27)
GFR calc Af Amer: 57 mL/min/{1.73_m2} — ABNORMAL LOW (ref >59)
GFR calc non Af Amer: 50 mL/min/{1.73_m2} — ABNORMAL LOW (ref >59)
Globulin, Total: 2.6 g/dL (ref 1.5–4.5)
Glucose: 98 mg/dL (ref 65–99)
Potassium: 4.2 mmol/L (ref 3.5–5.2)
Sodium: 146 mmol/L — ABNORMAL HIGH (ref 134–144)
Total Protein: 6.9 g/dL (ref 6.0–8.5)

## 2020-06-23 LAB — CBC
Hematocrit: 36.1 % — ABNORMAL LOW (ref 37.5–51.0)
Hemoglobin: 12.3 g/dL — ABNORMAL LOW (ref 13.0–17.7)
MCH: 30.1 pg (ref 26.6–33.0)
MCHC: 34.1 g/dL (ref 31.5–35.7)
MCV: 88 fL (ref 79–97)
Platelets: 263 10*3/uL (ref 150–450)
RBC: 4.09 x10E6/uL — ABNORMAL LOW (ref 4.14–5.80)
RDW: 12.3 % (ref 11.6–15.4)
WBC: 7.2 10*3/uL (ref 3.4–10.8)

## 2020-06-23 LAB — LIPID PANEL
Chol/HDL Ratio: 2.4 ratio (ref 0.0–5.0)
Cholesterol, Total: 132 mg/dL (ref 100–199)
HDL: 55 mg/dL (ref >39)
LDL Chol Calc (NIH): 60 mg/dL (ref 0–99)
Triglycerides: 89 mg/dL (ref 0–149)
VLDL Cholesterol Cal: 17 mg/dL (ref 5–40)

## 2020-06-23 LAB — HIV ANTIBODY (ROUTINE TESTING W REFLEX): HIV Screen 4th Generation wRfx: NONREACTIVE

## 2020-06-23 LAB — RPR: RPR Ser Ql: NONREACTIVE

## 2020-06-23 NOTE — Progress Notes (Signed)
Internal Medicine Clinic Attending  Case discussed with Dr. Christian  At the time of the visit.  We reviewed the resident's history and exam and pertinent patient test results.  I agree with the assessment, diagnosis, and plan of care documented in the resident's note.  
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
## 2020-06-23 NOTE — Assessment & Plan Note (Addendum)
Pt is requesting CBC, LFTs, and HIV testing today. Will also obtain an RPR. Influenza vaccine administered at today's visit.  Addendum 06/24/2020 12:42 PM  -HIV and RPR are negative -LFTS are wnl.

## 2020-06-23 NOTE — Assessment & Plan Note (Signed)
Currently smoking about a half pack a day. He is not interested in cessation at this time. Counseled on risks of ongoing tobacco use.

## 2020-06-23 NOTE — Assessment & Plan Note (Addendum)
Current medications: amlodipine 10mg  daily, lisinopril-hctz 20-25mg  daily, lisinopril 10mg , atorvastatin 20mg  Denies adverse medication effects. Blood pressure is at goal in the office today. BP Readings from Last 3 Encounters:  06/22/20 125/73  01/14/20 140/86  01/07/20 (!) 163/109   Renal function/ electrolytes are stable 10y ASCVD risk: 20% Lipid panel indicates fairly good control Lipid Panel     Component Value Date/Time   CHOL 132 06/22/2020 1443   TRIG 89 06/22/2020 1443   HDL 55 06/22/2020 1443   CHOLHDL 2.4 06/22/2020 1443   CHOLHDL 3.1 11/02/2010 0924   VLDL 21 11/02/2010 0924   LDLCALC 60 06/22/2020 1443   LABVLDL 17 06/22/2020 1443    Plan -continue  amlodipine 10mg  daily, lisinopril-hctz 20-25mg  daily, lisinopril 10mg  daily -continue lipitor 20mg  for high risk ASCVD -f/u in 6 mo

## 2020-06-24 DIAGNOSIS — N183 Chronic kidney disease, stage 3 unspecified: Secondary | ICD-10-CM | POA: Insufficient documentation

## 2020-06-24 DIAGNOSIS — D649 Anemia, unspecified: Secondary | ICD-10-CM | POA: Insufficient documentation

## 2020-06-24 NOTE — Assessment & Plan Note (Signed)
No recent CBCs for comparison. Hgb 12.5 in 2012 and then 13.4 in 2017. Hgb in 12.3 today.  MCV is borderline microcytic at 88 Pt denies melena or other source for blood loss. Last colonoscopy was in 2019 which was significant for 2 tubular adenoma polyps and some hemorrhoids. He was recommended to follow up in 5y which would be July 2024. He does have CKD3 which may be playing a role.  Plan -recommend obtaining an iron panel at his next OV -he is UTD for his age appropriate cancer screening

## 2020-06-24 NOTE — Assessment & Plan Note (Addendum)
Likely hypertensive nephropathy Renal function is stable at today's visit.  Repeat BMP in 84mo

## 2020-06-27 ENCOUNTER — Encounter: Payer: Self-pay | Admitting: Internal Medicine

## 2020-08-16 ENCOUNTER — Other Ambulatory Visit: Payer: Self-pay | Admitting: Student

## 2020-09-25 ENCOUNTER — Other Ambulatory Visit: Payer: Self-pay | Admitting: Student

## 2020-09-25 DIAGNOSIS — I10 Essential (primary) hypertension: Secondary | ICD-10-CM

## 2020-09-26 ENCOUNTER — Other Ambulatory Visit: Payer: Self-pay

## 2020-09-26 DIAGNOSIS — I10 Essential (primary) hypertension: Secondary | ICD-10-CM

## 2020-09-26 MED ORDER — LISINOPRIL-HYDROCHLOROTHIAZIDE 20-25 MG PO TABS: 1.0000 | ORAL_TABLET | Freq: Every day | ORAL | 0 refills | Status: DC

## 2020-09-26 NOTE — Telephone Encounter (Signed)
Pt is requesting his lisinopril-hydrochlorothiazide (ZESTORETIC) 20-25 MG tablet sent to  [facility] ([place] [place] - [address] Phone:  [phone]  Fax:  [phone]

## 2020-09-27 ENCOUNTER — Encounter: Payer: Medicare HMO | Admitting: Internal Medicine

## 2020-09-27 NOTE — Progress Notes (Deleted)
   Office Visit   Patient ID: KAGEN KUNATH, male    DOB: [birth year], 65 y.o.   MRN: [id number]  Subjective:  CC: ***  HPI 65 y.o. presents today for ***      ACTIVE MEDICATIONS   Outpatient Medications Prior to Visit  Medication Sig Dispense Refill  . amLODipine (NORVASC) 10 MG tablet Take 1 tablet (10 mg total) by mouth daily. 90 tablet 3  . atorvastatin (LIPITOR) 20 MG tablet Take 1 tablet by mouth once daily 90 tablet 1  . DULoxetine (CYMBALTA) 30 MG capsule Take 1 capsule by mouth once daily 30 capsule 5  . lisinopril (ZESTRIL) 10 MG tablet Take 1 tablet (10 mg total) by mouth daily. 90 tablet 1  . lisinopril-hydrochlorothiazide (ZESTORETIC) 20-25 MG tablet Take 1 tablet by mouth daily. 90 tablet 0   No facility-administered medications prior to visit.     ROS  Review of Systems  Objective:   There were no vitals taken for this visit. Wt Readings from Last 3 Encounters:  06/22/20 178 lb 12.8 oz (81.1 kg)  01/14/20 174 lb 4.8 oz (79.1 kg)  01/07/20 170 lb 9.6 oz (77.4 kg)   BP Readings from Last 3 Encounters:  06/22/20 125/73  01/14/20 140/86  01/07/20 (!) 163/109   Physical Exam  Health Maintenance:   Health Maintenance  Topic Date Due  . COVID-19 Vaccine (2 - Pfizer 3-dose series) 01/28/2020  . TETANUS/TDAP  11/01/2020  . COLONOSCOPY (Pts 45-4yrs Insurance coverage will need to be confirmed)  02/19/2023  . INFLUENZA VACCINE  Completed  . Hepatitis C Screening  Completed  . HIV Screening  Completed  . HPV VACCINES  Aged Out     Assessment & Plan:   Problem List Items Addressed This Visit      Cardiovascular and Mediastinum   Essential hypertension (Chronic)    Current medications: amlodipine 10mg , lisinopril 10mg , lisiniopril-hctz 20-25mg             Pt discussed with ***  Mitzi Hansen, MD Internal Medicine Resident PGY-2 [facility] Pager: [phone] 09/27/2020 8:42 AM

## 2020-09-27 NOTE — Assessment & Plan Note (Deleted)
Current medications: amlodipine 10mg , lisinopril 10mg , lisiniopril-hctz 20-25mg 

## 2020-11-01 ENCOUNTER — Telehealth: Payer: Self-pay

## 2020-11-01 NOTE — Telephone Encounter (Signed)
Pt requesting an appt for c/o legs and arms numbness. Stated this has been going on for awhile just more frequently. Sometimes when he tries to pick up something, his hands start to shake. No c/o cp,sob,fever. He had his covid vaccines and booster. No appts until Thursday. Appt scheduled Thurs 4/14 @ 0915 AM with Dr Lisabeth Devoid. Pt instructed if symptoms worsen or develops cp/sob to call 911or go to the ED; verbalized understanding.

## 2020-11-01 NOTE — Telephone Encounter (Signed)
I agree

## 2020-11-03 ENCOUNTER — Other Ambulatory Visit: Payer: Self-pay

## 2020-11-03 ENCOUNTER — Ambulatory Visit (INDEPENDENT_AMBULATORY_CARE_PROVIDER_SITE_OTHER): Payer: Medicare Other | Admitting: Student

## 2020-11-03 VITALS — BP 99/68 | HR 79 | Temp 98.6°F | Ht 73.5 in | Wt 185.5 lb

## 2020-11-03 DIAGNOSIS — R200 Anesthesia of skin: Secondary | ICD-10-CM | POA: Diagnosis not present

## 2020-11-03 DIAGNOSIS — R202 Paresthesia of skin: Secondary | ICD-10-CM | POA: Diagnosis not present

## 2020-11-03 DIAGNOSIS — G35D Multiple sclerosis, unspecified: Secondary | ICD-10-CM

## 2020-11-03 DIAGNOSIS — G35 Multiple sclerosis: Secondary | ICD-10-CM

## 2020-11-03 NOTE — Patient Instructions (Signed)
It was a pleasure seeing you in clinic. Today we discussed:   Numbness and tingling in arms and legs: We will check labs and I will call you with the results. I have also ordered imaging of the neck and brain to further evaluate this. Please make an appointment to follow up after you get your imaging.   If you have any questions or concerns, please call our clinic at [phone] between 9am-5pm and after hours call [phone] and ask for the internal medicine resident on call. If you feel you are having a medical emergency please call 911.   Thank you, we look forward to helping you remain healthy!

## 2020-11-04 LAB — TSH: TSH: 0.902 u[IU]/mL (ref 0.450–4.500)

## 2020-11-04 LAB — VITAMIN B12: Vitamin B-12: 676 pg/mL (ref 232–1245)

## 2020-11-07 DIAGNOSIS — R202 Paresthesia of skin: Secondary | ICD-10-CM | POA: Insufficient documentation

## 2020-11-07 NOTE — Assessment & Plan Note (Addendum)
Patient presenting with 8-10 months of intermittent episodes of numbness and tingling in bilateral upper extremities and of the LLE. States hands are numb all over and radiate up to his shoulder, has trouble picking up objects like his cane during these episodes. Has no associated pain. Also notes LLE some times feels like "it does not exist" when standing. Upper extremities symptoms always bilateral. LE symptoms sometimes associated the UE symptoms but also occur independently of UE symptoms. States episodes occur 2-3 times week. Denies any changes in medication, denies supplement use or other illicit drug use. Not have symptoms during OV today. PE notable for decreased LE sensation R>L., UE strength and sensation intact. No history of diabetes, RPR in December negative. Differential for bilateral UE paraesthesia for cervical spine pathology, peripheral neuropathy, alcohol induced necropathy given significant alcohol use, also consider demyelinating disease given episodic nature of these episodes.   Plan  TSH, B12 - wnl  MRI cervical spine  MRI head  If imaging negative consider referral to neurology

## 2020-11-07 NOTE — Progress Notes (Signed)
Internal Medicine Clinic Attending ? ?Case discussed with Dr. Liang  At the time of the visit.  We reviewed the resident?s history and exam and pertinent patient test results.  I agree with the assessment, diagnosis, and plan of care documented in the resident?s note. ? ?

## 2020-11-07 NOTE — Progress Notes (Signed)
CC: Numbness and tingling of arms and left leg  HPI:  Jonathan [name] is a 65 y.o. male with history below presents for paraesthesia of upper extremities and left lower extremity for the past 8-10 months. Please refer to problem based charting for further details and assessment and plan of current problem and chronic medical conditions.   Past Medical History:  Diagnosis Date  . Allergy   . Anxiety   . Blood transfusion without reported diagnosis    as baby  . Chronic Bil Foot Pain 05/27/2011   Lt > Rt - since age 77 2/2 to post polio syndrome  Corns and callosities on both feet  Reconstruction surgery at age 20. Uses a brace on left leg for many years On prn tylenol for pain  No candidate for opiates due to active substance abuse Podiatry and PT (for brace eval) on 04/23/2013    . Chronic viral hepatitis C ([place]) 06/05/2006   History of blood transfusion in childhood Dx 2010 Reports history of Rx while in [place], unknown yr Elevated LFTs since 2010 and repeat 2014 U/S 2009 - no cirrhosis, repeat in 12/2013 >> normal.  Fibrosure >> pending results  Referred to Hep C clinic 04/23/2013 >> appt in 06/2013 Hep C clinic [phone] Immunity to hep A per labs at Hep C clinic  Started on Hep B series 1 st dose 02/03/2014, 2 dose given 04/23/2014. Last dose about 09/2014.  Follow with Hep C clinic       . ERECTILE DYSFUNCTION 06/05/2006   Qualifier: Diagnosis of  By: Grandville Silos MD, Quillian Quince    . Essential hypertension 06/05/2006   Dx in 2008  Has BP machine at home  Checks erratically  Chronic no show issues      . GERD (gastroesophageal reflux disease)    occ s/s  . Hypertension   . Polysubstance abuse ([place]) 04/23/2013   Cocaine (sniffs powder) and marijuana No IVDU   . POST-POLIO SYNDROME 06/05/2006   Hx of polio at age 66 Has spasms in his legs on and off Used Flexeril on and off for spasms 2015 May >> sent to rehab/PT. rec new leg braces/ortho     Review of Systems:  Negative as per  HPI  Physical Exam:  Vitals:   11/03/20 0925  BP: 99/68  Pulse: 79  Temp: 98.6 F (37 C)  TempSrc: Oral  SpO2: 99%  Weight: 185 lb 8 oz (84.1 kg)  Height: 6' 1.5" (1.867 m)   Physical Exam Constitutional:      Appearance: Normal appearance.  HENT:     Head: Normocephalic and atraumatic.     Nose: Nose normal.     Mouth/Throat:     Mouth: Mucous membranes are moist.     Pharynx: Oropharynx is clear.  Eyes:     Extraocular Movements: Extraocular movements intact.     Pupils: Pupils are equal, round, and reactive to light.  Cardiovascular:     Rate and Rhythm: Normal rate and regular rhythm.  Pulmonary:     Effort: Pulmonary effort is normal.     Breath sounds: Normal breath sounds.  Abdominal:     General: Abdomen is flat. Bowel sounds are normal.     Palpations: Abdomen is soft.  Musculoskeletal:        General: Deformity (reconstructive changes to left foot) present.     Cervical back: Normal range of motion. No tenderness.  Skin:    General: Skin is warm and dry.  Neurological:  Mental Status: He is alert and oriented to person, place, and time.     Cranial Nerves: No cranial nerve deficit.     Sensory: Sensory deficit present.     Coordination: Coordination is intact.     Comments: Decreased sensation to temperature of bilateral lower extremities, normal vibratory sense, normal sensation of upper extremities, 4/5 strength of LLE consistent with baseline  Psychiatric:        Mood and Affect: Mood normal.        Behavior: Behavior normal.      Assessment & Plan:   See Encounters Tab for problem based charting.  Patient discussed with Dr. Dareen Piano

## 2020-11-22 ENCOUNTER — Encounter: Payer: Self-pay | Admitting: *Deleted

## 2020-11-22 NOTE — Progress Notes (Unsigned)

## 2020-11-23 NOTE — Progress Notes (Unsigned)
Things That May Be Affecting Your Health: x Alcohol  Hearing loss  Pain    Depression  Home Safety  Sexual Health   Diabetes  Lack of physical activity  Stress   Difficulty with daily activities  Loneliness  Tiredness  x Drug use  Medicines  Tobacco use   Falls  Motor Vehicle Safety  Weight   Food choices x Oral Health  Other    YOUR PERSONALIZED HEALTH PLAN : 1. Schedule your next subsequent Medicare Wellness visit in one year 2. Attend all of your regular appointments to address your medical issues 3. Complete the preventative screenings and services   Annual Wellness Visit   Medicare Covered Preventative Screenings and [place] Men and Women Who How Often Need? Date of Last Service Action  Abdominal Aortic Aneurysm Adults with AAA risk factors Once      Alcohol Misuse and Counseling All Adults Screening once a year if no alcohol misuse. Counseling up to 4 face to face sessions. x    Bone Density Measurement  Adults at risk for osteoporosis Once every 2 yrs      Lipid Panel Z13.6 All adults without CV disease Once every 5 yrs x      Colorectal Cancer   Stool sample or  Colonoscopy All adults 38 and older   Once every year  Every 10 years        Depression All Adults Once a year  Today   Diabetes Screening Blood glucose, post glucose load, or GTT Z13.1  All adults at risk  Pre-diabetics  Once per year  Twice per year      Diabetes  Self-Management Training All adults Diabetics 10 hrs first year; 2 hours subsequent years. Requires Copay     Glaucoma  Diabetics  Family history of glaucoma  African Americans 28 yrs +  Hispanic Americans 69 yrs + Annually - requires coppay      Hepatitis C Z72.89 or F19.20  High Risk for HCV  Born between 1945 and 1965  Annually  Once      HIV Z11.4 All adults based on risk  Annually btw ages 18 & 92 regardless of risk  Annually > 65 yrs if at increased risk      Lung Cancer Screening  Asymptomatic adults aged 60-77 with 30 pack yr history and current smoker OR quit within the last 15 yrs Annually Must have counseling and shared decision making documentation before first screen x     Medical Nutrition Therapy Adults with   Diabetes  Renal disease  Kidney transplant within past 3 yrs 3 hours first year; 2 hours subsequent years     Obesity and Counseling All adults Screening once a year Counseling if BMI 30 or higher x Today   Tobacco Use Counseling Adults who use tobacco  Up to 8 visits in one year x    Vaccines Z23  Hepatitis B  Influenza   Pneumonia  Adults   Once  Once every flu season  Two different vaccines separated by one year x    Next Annual Wellness Visit People with Medicare Every year x Today     Services & Screenings Women Who How Often Need  Date of Last Service Action  Mammogram  Z12.31 Women over 58 One baseline ages 73-39. Annually ager 40 yrs+      Pap tests All women Annually if high risk. Every 2 yrs for normal risk women  Screening for cervical cancer with   Pap (Z01.419 nl or Z01.411abnl) &  HPV Z11.51 Women aged 27 to 30 Once every 5 yrs     Screening pelvic and breast exams All women Annually if high risk. Every 2 yrs for normal risk women     Sexually Transmitted Diseases  Chlamydia  Gonorrhea  Syphilis All at risk adults Annually for non pregnant females at increased risk         [place] Men Who How Ofter Need  Date of Last Service Action  Prostate Cancer - DRE & PSA Men over 50 Annually.  DRE might require a copay.        Sexually Transmitted Diseases  Syphilis All at risk adults Annually for men at increased risk      Health Maintenance List Health Maintenance  Topic Date Due  . COVID-19 Vaccine (2 - Pfizer 3-dose series) 01/28/2020  . TETANUS/TDAP  11/01/2020  . INFLUENZA VACCINE  02/20/2021  . COLONOSCOPY (Pts 45-85yrs Insurance coverage will need to be confirmed)  02/19/2023  .  Hepatitis C Screening  Completed  . HIV Screening  Completed  . HPV VACCINES  Aged Out

## 2020-12-19 ENCOUNTER — Other Ambulatory Visit: Payer: Self-pay | Admitting: Internal Medicine

## 2020-12-19 DIAGNOSIS — I10 Essential (primary) hypertension: Secondary | ICD-10-CM

## 2020-12-22 NOTE — Telephone Encounter (Signed)
Thank you :)

## 2020-12-22 NOTE — Telephone Encounter (Signed)
Kay--I see this refill is for 5 pills but he has another script that was sent in in part for 90 days. Does he need a refill now or can he be seen in the office first in case we need to adjust his dosing?

## 2020-12-22 NOTE — Telephone Encounter (Signed)
Please have patient arrange an OV with me for hypertension follow up. Thank you

## 2020-12-22 NOTE — Telephone Encounter (Addendum)
Not sure why qty was changed to #5.  Pt has been scheduled to see pcp on 12/27/20-pt states he has enough pills to last until that visit. CMA will refuse rx at this time and pt will get refill during visit.   Pt also inquired about MRI that was ordered during his last visit and will address during visit.Despina Hidden Cassady6/2/20221:30 PM

## 2020-12-23 NOTE — Telephone Encounter (Signed)
This patient has now corrected his Ins Information from [place] to [place].  MRI's have been Authorized and he has been sch for both Mri's on 12/26/2020 @ [facility].

## 2020-12-25 NOTE — Assessment & Plan Note (Addendum)
He had some mild anemia on his last CBC so will check an iron panel today. He denies any overt blood loss. He is up to date on age appropriate cancer screening.

## 2020-12-25 NOTE — Assessment & Plan Note (Addendum)
HYPERTENSION FOLLOW-UP: Current medications: amlodipine 10mg ,  lisinopril-hctz 20-25mg  Med Adherence: [x]  Yes    []  No Medication side effects: []  Yes    [x]  No Adherence with salt restriction: [x]  Yes    []  No  Assessment: Blood pressure is at goal goal in the office today. BP Readings from Last 3 Encounters:  12/27/20 128/80  11/03/20 99/68  06/22/20 125/73   Electrolytes and renal function on stable on BMP today.   Plan -switched to olmesartan-amlodipine-hctz 40-10-25mg  -follow up 3 mo

## 2020-12-26 ENCOUNTER — Ambulatory Visit (HOSPITAL_COMMUNITY)
Admission: RE | Admit: 2020-12-26 | Discharge: 2020-12-26 | Disposition: A | Discharge status: Home / Self Care | Payer: Medicare Other | Source: Ambulatory Visit | Attending: Internal Medicine | Admitting: Internal Medicine

## 2020-12-26 ENCOUNTER — Other Ambulatory Visit: Payer: Self-pay | Admitting: Internal Medicine

## 2020-12-26 ENCOUNTER — Other Ambulatory Visit: Payer: Self-pay

## 2020-12-26 DIAGNOSIS — R202 Paresthesia of skin: Secondary | ICD-10-CM | POA: Diagnosis present

## 2020-12-26 DIAGNOSIS — G35 Multiple sclerosis: Secondary | ICD-10-CM | POA: Insufficient documentation

## 2020-12-26 DIAGNOSIS — R200 Anesthesia of skin: Secondary | ICD-10-CM | POA: Insufficient documentation

## 2020-12-26 DIAGNOSIS — I10 Essential (primary) hypertension: Secondary | ICD-10-CM

## 2020-12-26 MED ORDER — GADOBUTROL 1 MMOL/ML IV SOLN
8.5000 mL | Freq: Once | INTRAVENOUS | Status: AC | PRN
  Administered 2020-12-26: 8.5 mL via INTRAVENOUS

## 2020-12-27 ENCOUNTER — Ambulatory Visit (INDEPENDENT_AMBULATORY_CARE_PROVIDER_SITE_OTHER): Payer: Medicare Other | Admitting: Internal Medicine

## 2020-12-27 ENCOUNTER — Encounter: Payer: Self-pay | Admitting: Internal Medicine

## 2020-12-27 ENCOUNTER — Other Ambulatory Visit: Payer: Self-pay

## 2020-12-27 ENCOUNTER — Other Ambulatory Visit: Payer: Self-pay | Admitting: *Deleted

## 2020-12-27 VITALS — BP 128/80 | HR 68 | Temp 97.9°F | Ht 73.0 in | Wt 184.5 lb

## 2020-12-27 DIAGNOSIS — M4807 Spinal stenosis, lumbosacral region: Secondary | ICD-10-CM

## 2020-12-27 DIAGNOSIS — B91 Sequelae of poliomyelitis: Secondary | ICD-10-CM | POA: Diagnosis not present

## 2020-12-27 DIAGNOSIS — Z23 Encounter for immunization: Secondary | ICD-10-CM | POA: Diagnosis not present

## 2020-12-27 DIAGNOSIS — I10 Essential (primary) hypertension: Secondary | ICD-10-CM

## 2020-12-27 DIAGNOSIS — Z0000 Encounter for general adult medical examination without abnormal findings: Secondary | ICD-10-CM | POA: Diagnosis not present

## 2020-12-27 DIAGNOSIS — D649 Anemia, unspecified: Secondary | ICD-10-CM

## 2020-12-27 DIAGNOSIS — N1831 Chronic kidney disease, stage 3a: Secondary | ICD-10-CM

## 2020-12-27 DIAGNOSIS — I129 Hypertensive chronic kidney disease with stage 1 through stage 4 chronic kidney disease, or unspecified chronic kidney disease: Secondary | ICD-10-CM | POA: Diagnosis not present

## 2020-12-27 DIAGNOSIS — I739 Peripheral vascular disease, unspecified: Secondary | ICD-10-CM | POA: Diagnosis not present

## 2020-12-27 MED ORDER — OLMESARTAN-AMLODIPINE-HCTZ 40-10-25 MG PO TABS: ORAL_TABLET | ORAL | 3 refills | Status: DC

## 2020-12-27 MED ORDER — ATORVASTATIN CALCIUM 20 MG PO TABS: 1.0000 | ORAL_TABLET | Freq: Every day | ORAL | 1 refills | Status: DC

## 2020-12-27 MED ORDER — AMLODIPINE BESYLATE 10 MG PO TABS: 10.0000 mg | ORAL_TABLET | Freq: Every day | ORAL | 3 refills | Status: DC

## 2020-12-27 NOTE — Patient Instructions (Addendum)
It was wonderful to see you in the office today.  The MRI of your brain was normal. The MRI of your cervical (upper) spine, did show some narrowing in your spine that may be contributing to your symptoms in your arm.  In regards to your blood pressure medicine, I have combined your pills into one pill that you will day daily. Please discard of the amlodipine, lisinopril, and lisinopril-hctz that you currently have.  Please come back and see me in 3 months!Marland Kitchen

## 2020-12-28 LAB — BASIC METABOLIC PANEL
BUN/Creatinine Ratio: 20 (ref 10–24)
BUN: 24 mg/dL (ref 8–27)
CO2: 20 mmol/L (ref 20–29)
Calcium: 9.8 mg/dL (ref 8.6–10.2)
Chloride: 106 mmol/L (ref 96–106)
Creatinine, Ser: 1.2 mg/dL (ref 0.76–1.27)
Glucose: 80 mg/dL (ref 65–99)
Potassium: 3.8 mmol/L (ref 3.5–5.2)
Sodium: 143 mmol/L (ref 134–144)
eGFR: 68 mL/min/{1.73_m2} (ref >59)

## 2020-12-28 LAB — IRON,TIBC AND FERRITIN PANEL
Ferritin: 103 ng/mL (ref 30–400)
Iron Saturation: 31 % (ref 15–55)
Iron: 99 ug/dL (ref 38–169)
Total Iron Binding Capacity: 321 ug/dL (ref 250–450)
UIBC: 222 ug/dL (ref 111–343)

## 2020-12-28 MED ORDER — SHINGRIX 50 MCG/0.5ML IM SUSR: 0.5000 mL | Freq: Once | INTRAMUSCULAR | 1 refills | Status: AC

## 2020-12-28 NOTE — Assessment & Plan Note (Signed)
I suspect that the paraesthesias and extremity weaknesses that he has been experiencing are a combination of his spinal stenosis and post polio syndrome.  He is not currently experiencing any respiratory or bulbar symptoms however these may occur over time, even if they were not present during the initial polio disease. Unfortunately, there is no treatment of the degenerative process of PPS so we will continue to treat supportively.

## 2020-12-28 NOTE — Progress Notes (Signed)
Office Visit   Patient ID: Jonathan [name], male    DOB: April 03, 1956, 65 y.o.   MRN: [id number]   PCP: Mitzi Hansen, MD   Subjective:  CC: hypertension, radiculopathy  Jonathan [name] is a 65 y.o. year old male who presents for follow up of his chronic medical conditions. Please refer to problem based charting for assessment and plan.      ACTIVE MEDICATIONS   Outpatient Medications Prior to Visit  Medication Sig Dispense Refill  . amLODipine (NORVASC) 10 MG tablet Take 1 tablet (10 mg total) by mouth daily. 90 tablet 3  . atorvastatin (LIPITOR) 20 MG tablet Take 1 tablet by mouth once daily 90 tablet 1  . DULoxetine (CYMBALTA) 30 MG capsule Take 1 capsule by mouth once daily 30 capsule 5  . lisinopril (ZESTRIL) 10 MG tablet Take 1 tablet (10 mg total) by mouth daily. 90 tablet 1  . lisinopril-hydrochlorothiazide (ZESTORETIC) 20-25 MG tablet Take 1 tablet by mouth daily. 90 tablet 0   No facility-administered medications prior to visit.     Objective:   BP 128/80 (BP Location: Right Arm, Patient Position: Sitting, Cuff Size: Normal)   Pulse 68   Temp 97.9 F (36.6 C) (Oral)   Ht 6\' 1"  (1.854 m)   Wt 184 lb 8 oz (83.7 kg)   SpO2 100%   BMI 24.34 kg/m  Wt Readings from Last 3 Encounters:  12/27/20 184 lb 8 oz (83.7 kg)  11/03/20 185 lb 8 oz (84.1 kg)  06/22/20 178 lb 12.8 oz (81.1 kg)   BP Readings from Last 3 Encounters:  12/27/20 128/80  11/03/20 99/68  06/22/20 125/73   Physical exam General: well appearing, in NAD CV: RRR, no LE edema. Lower legs are shiny and hairless bilaterally. Weak pedal pulses on palpation but appreciable by doppler.  Pulm: lungs clear  ABIs Left: 1.27 Right: 1.39  Health Maintenance:   Health Maintenance  Topic Date Due  . Pneumococcal Vaccine 54-12 Years old (1 of 2 - PPSV23) Never done  . Zoster Vaccines- Shingrix (1 of 2) Never done  . INFLUENZA VACCINE  02/20/2021  . COLONOSCOPY (Pts 45-53yrs Insurance coverage will  need to be confirmed)  02/19/2023  . TETANUS/TDAP  12/28/2030  . COVID-19 Vaccine  Completed  . Hepatitis C Screening  Completed  . HIV Screening  Completed  . HPV VACCINES  Aged Out     Assessment & Plan:   Problem List Items Addressed This Visit      Cardiovascular and Mediastinum   Essential hypertension (Chronic)    HYPERTENSION FOLLOW-UP: Current medications: amlodipine 10mg ,  lisinopril-hctz 20-25mg  Med Adherence: [x]  Yes    []  No Medication side effects: []  Yes    [x]  No Adherence with salt restriction: [x]  Yes    []  No  Assessment: Blood pressure is at goal goal in the office today. BP Readings from Last 3 Encounters:  12/27/20 128/80  11/03/20 99/68  06/22/20 125/73   Electrolytes and renal function on stable on BMP today.   Plan -switched to olmesartan-amlodipine-hctz 40-10-25mg  -follow up 3 mo       Relevant Medications   Olmesartan-amLODIPine-HCTZ 40-10-25 MG TABS   Other Relevant Orders   Basic metabolic panel (Completed)     Nervous and Auditory   POST-POLIO SYNDROME (Chronic)    I suspect that the paraesthesias and extremity weaknesses that he has been experiencing are a combination of his spinal stenosis and post polio syndrome.  He is not currently  experiencing any respiratory or bulbar symptoms however these may occur over time, even if they were not present during the initial polio disease. Unfortunately, there is no treatment of the degenerative process of PPS so we will continue to treat supportively.         Genitourinary   CKD (chronic kidney disease) stage 3, GFR 30-59 ml/min (HCC) (Chronic)    Chronic and stable on BMP today. No change in management at this time.        Other   Preventative health care (Chronic)    pneumococcal and Tdap vaccines provided at today's visit.       Spinal stenosis (Chronic)    Pt denies any new or worsening of his symptoms since his last OV 11/03/20 at which time brain and C-spine MRI were ordered.  I  reviewed the results of his MRI with him today. Brain MRI was unremarkable. His C-spine MRI revealed cord flattening at the C3-4 and C5-6 levels which may be accounting for the paraesthesias in his upper extremities that he has been experiencing.  He continues to endorse LLE radiculopathy.  I explained that his symptoms in the upper and lower extremities are likely from the spinal stenosis and recommended evaluation by neurosurgery. Pt declined the referral and was adamant that he would "never" have any surgery, injections, or other invasive procedures for his back. I explained that his symptoms are likely to worsen over time without intervention and he expressed understanding. He had previously been on duloxetine however does not wish to resume this. He has also been on gabapentin which he did not like.   Plan: no further workup at this time. We will continue to monitor his symptoms and treat supportively.      Anemia    He had some mild anemia on his last CBC so will check an iron panel today. He denies any overt blood loss. He is up to date on age appropriate cancer screening.       Relevant Orders   Iron, TIBC and Ferritin Panel (Completed)    Other Visit Diagnoses    Claudication of both lower extremities ([place])    -  Primary   Relevant Orders   POCT ABI Screening Pilot No Charge   Healthcare maintenance       Relevant Orders   Pneumococcal polysaccharide vaccine 23-valent greater than or equal to 2yo subcutaneous/IM (Completed)   Tdap vaccine greater than or equal to 7yo IM (Completed)       Return in about 3 months (around 03/29/2021).   Pt discussed with Dr. Venetia Maxon, MD Internal Medicine Resident PGY-2 [facility] Pager: [phone] 12/28/2020 10:18 AM

## 2020-12-28 NOTE — Assessment & Plan Note (Signed)
pneumococcal and Tdap vaccines provided at today's visit.

## 2020-12-28 NOTE — Assessment & Plan Note (Signed)
Pt denies any new or worsening of his symptoms since his last OV 11/03/20 at which time brain and C-spine MRI were ordered.  I reviewed the results of his MRI with him today. Brain MRI was unremarkable. His C-spine MRI revealed cord flattening at the C3-4 and C5-6 levels which may be accounting for the paraesthesias in his upper extremities that he has been experiencing.  He continues to endorse LLE radiculopathy.  I explained that his symptoms in the upper and lower extremities are likely from the spinal stenosis and recommended evaluation by neurosurgery. Pt declined the referral and was adamant that he would "never" have any surgery, injections, or other invasive procedures for his back. I explained that his symptoms are likely to worsen over time without intervention and he expressed understanding. He had previously been on duloxetine however does not wish to resume this. He has also been on gabapentin which he did not like.   Plan: no further workup at this time. We will continue to monitor his symptoms and treat supportively.

## 2020-12-28 NOTE — Assessment & Plan Note (Signed)
Chronic and stable on BMP today. No change in management at this time.

## 2020-12-30 NOTE — Progress Notes (Signed)
Internal Medicine Clinic Attending  Case discussed with Dr. Christian  At the time of the visit.  We reviewed the resident's history and exam and pertinent patient test results.  I agree with the assessment, diagnosis, and plan of care documented in the resident's note.  

## 2021-03-28 ENCOUNTER — Other Ambulatory Visit: Payer: Self-pay | Admitting: Internal Medicine

## 2021-03-28 DIAGNOSIS — I10 Essential (primary) hypertension: Secondary | ICD-10-CM

## 2021-04-04 ENCOUNTER — Other Ambulatory Visit: Payer: Self-pay | Admitting: Internal Medicine

## 2021-04-17 ENCOUNTER — Encounter: Payer: Self-pay | Admitting: Internal Medicine

## 2021-04-17 NOTE — Assessment & Plan Note (Addendum)
HYPERTENSION FOLLOW-UP: Current medications: olmesartan-amlodipine-hctz 40-10-25mg  Med Adherence: [x]  Yes    []  No Medication side effects: []  Yes    [x]  No Adherence with salt restriction: [x]  Yes    []  No Assessment: Blood pressure is at goal in the office today. BP Readings from Last 3 Encounters:  04/19/21 138/77  12/27/20 128/80  11/03/20 99/68   Plan -continue current management -BMP today -follow up January 2023 with me -SBP goal <172mmHg

## 2021-04-19 ENCOUNTER — Encounter: Payer: Self-pay | Admitting: Internal Medicine

## 2021-04-19 ENCOUNTER — Other Ambulatory Visit: Payer: Self-pay

## 2021-04-19 ENCOUNTER — Ambulatory Visit (INDEPENDENT_AMBULATORY_CARE_PROVIDER_SITE_OTHER): Payer: Medicare Other | Admitting: Internal Medicine

## 2021-04-19 VITALS — BP 138/77 | HR 87 | Temp 98.7°F | Ht 73.0 in | Wt 188.3 lb

## 2021-04-19 DIAGNOSIS — F17210 Nicotine dependence, cigarettes, uncomplicated: Secondary | ICD-10-CM

## 2021-04-19 DIAGNOSIS — Z0000 Encounter for general adult medical examination without abnormal findings: Secondary | ICD-10-CM | POA: Diagnosis not present

## 2021-04-19 DIAGNOSIS — I10 Essential (primary) hypertension: Secondary | ICD-10-CM

## 2021-04-19 DIAGNOSIS — K219 Gastro-esophageal reflux disease without esophagitis: Secondary | ICD-10-CM | POA: Insufficient documentation

## 2021-04-19 DIAGNOSIS — B91 Sequelae of poliomyelitis: Secondary | ICD-10-CM

## 2021-04-19 DIAGNOSIS — R202 Paresthesia of skin: Secondary | ICD-10-CM

## 2021-04-19 DIAGNOSIS — G14 Postpolio syndrome: Secondary | ICD-10-CM

## 2021-04-19 DIAGNOSIS — D649 Anemia, unspecified: Secondary | ICD-10-CM

## 2021-04-19 DIAGNOSIS — R12 Heartburn: Secondary | ICD-10-CM

## 2021-04-19 DIAGNOSIS — L989 Disorder of the skin and subcutaneous tissue, unspecified: Secondary | ICD-10-CM

## 2021-04-19 DIAGNOSIS — F17200 Nicotine dependence, unspecified, uncomplicated: Secondary | ICD-10-CM

## 2021-04-19 MED ORDER — PANTOPRAZOLE SODIUM 40 MG PO TBEC: 40.0000 mg | DELAYED_RELEASE_TABLET | Freq: Every day | ORAL | 1 refills | Status: DC

## 2021-04-19 NOTE — Assessment & Plan Note (Addendum)
Interm update: iron panel from June was not consistent with iron deficiency. He is up to date on age appropriate cancer screenings.  Suspect that this is related to his renal disease however due to this new onset gastric reflux that he is experiencing, we will repeat a CBC today to ensure there is not worsening of the anemia.

## 2021-04-19 NOTE — Progress Notes (Signed)
Office Visit   Patient ID: Jonathan [name], male    DOB: 11-19-1955, 65 y.o.   MRN: [id number]   PCP: Mitzi Hansen, MD   Subjective:  Jonathan [name] is a 65 y.o. year old male who presents for follow up of hypertension and post polio syndrome. Acute complaints include skin lesions, GERD. Please refer to problem based charting for assessment and plan.   ACTIVE MEDICATIONS   Outpatient Medications Prior to Visit  Medication Sig   atorvastatin (LIPITOR) 20 MG tablet Take 1 tablet (20 mg total) by mouth daily.   Olmesartan-amLODIPine-HCTZ 40-10-25 MG TABS Take 1 tablet by mouth once daily   No facility-administered medications prior to visit.     Objective:   BP 138/77 (BP Location: Right Arm, Patient Position: Sitting, Cuff Size: Normal)   Pulse 87   Temp 98.7 F (37.1 C) (Oral)   Ht 6\' 1"  (1.854 m)   Wt 188 lb 4.8 oz (85.4 kg)   SpO2 99%   BMI 24.84 kg/m  Wt Readings from Last 3 Encounters:  04/19/21 188 lb 4.8 oz (85.4 kg)  12/27/20 184 lb 8 oz (83.7 kg)  11/03/20 185 lb 8 oz (84.1 kg)    BP Readings from Last 3 Encounters:  04/19/21 138/77  12/27/20 128/80  11/03/20 99/68   General: Well-appearing, no distress Cardiac: Regular rate rhythm Pulm: Lungs clear throughout Lower extremities:  Flexor > extensor weakness present at the level of the hip knee ankle on the left.  Normal strength on the right. Diminished sensation of the left lower extremity Skin: #1 (photo on the left) Hyperpigmented papular lesion on the right upper inner thigh.  Regular borders.  Homogenous.  Approx less than 1 cm in size. #2 (photo on the right) Hyperpigmented papular lesion on the right calf.  Regular borders, homogenous, less than 1 cm in size         Health Maintenance:   Health Maintenance  Topic Date Due   Zoster Vaccines- Shingrix (1 of 2) Never done   COVID-19 Vaccine (4 - Booster for Pfizer series) 11/29/2020   INFLUENZA VACCINE  02/20/2021   COLONOSCOPY (Pts  45-84yrs Insurance coverage will need to be confirmed)  02/19/2023   TETANUS/TDAP  12/28/2030   Hepatitis C Screening  Completed   HIV Screening  Completed   HPV VACCINES  Aged Out    Assessment & Plan:   Problem List Items Addressed This Visit       Cardiovascular and Mediastinum   Essential hypertension (Chronic)    HYPERTENSION FOLLOW-UP: Current medications: olmesartan-amlodipine-hctz 40-10-25mg  Med Adherence: [x]  Yes    []  No Medication side effects: []  Yes    [x]  No Adherence with salt restriction: [x]  Yes    []  No Assessment: Blood pressure is at goal in the office today. BP Readings from Last 3 Encounters:  04/19/21 138/77  12/27/20 128/80  11/03/20 99/68  Plan -continue current management -BMP today -follow up January 2023 with me -SBP goal <176mmHg       Relevant Orders   Basic metabolic panel     Digestive   Gastroesophageal reflux disease    He notes today that he has been experiencing heartburn symptoms over the past 2 to 3 months.  Symptoms include epigastric pain, reflux, and intermittent nausea.  He also is noting an occasional sensation of something being stuck in his esophagus.  He denies any change in his appetite and weight is actually up 4 pounds from his last visit  with me.  He denies melena or hematochezia.  No prior similar symptoms in the past. Since this is new onset and he is experiencing sensation of food getting stuck in his esophagus, we will refer him to GI for EGD Start Protonix 40 mg daily Reviewed supportive measures for gastric reflux      Relevant Medications   pantoprazole (PROTONIX) 40 MG tablet   Other Relevant Orders   Ambulatory referral to Gastroenterology     Nervous and Auditory   POST-POLIO SYNDROME - Primary (Chronic)    He continues to experience similar neurological symptoms as noted from his visit with me in June. We revisted the natural history of PPS and discussed that his spinal stenosis may be contributing. He  remains adamant that he would not want surgical intervention for correction of spinal stenosis, so we will defer MRI imaging at this time. As far as I found in literature review, no treatment exists for this however, based on his concern regarding his symptoms, I would appreciate neurology's input. Referral to neurology placed      Relevant Orders   Ambulatory referral to Neurology     Musculoskeletal and Integument   Skin lesions    He notes the development of 2 different skin lesions on his right leg (upper thigh and calf) over the period of the last couple years.  See photos under media tab.  They do not appear to have any major alarming features however we will keep an eye on them and refer to dermatology if a greater concern arises.        Other   Preventative health care (Chronic)    Declines influenza vaccination today.  He is aware he can arrange a nurse only visit in the future to get this done here He has received appropriate COVID-19 vaccination including 2 vaccines and 1 posterior. He received 1 shingles vaccine but has not received the other.  He is aware to return to the pharmacy to get this done.      Tobacco use disorder (Chronic)    He continues to smoke roughly half a pack of cigarettes a day.  He acknowledges that he needs to quit smoking however does not seem overly invested in this at this time as he is primarily concerned about the left leg numbness and acid reflux.  We will continue ongoing discussions about smoking cessation over the next year      Anemia    Interm update: iron panel from June was not consistent with iron deficiency. He is up to date on age appropriate cancer screenings.  Suspect that this is related to his renal disease however due to this new onset gastric reflux that he is experiencing, we will repeat a CBC today to ensure there is not worsening of the anemia.      Relevant Orders   CBC no Diff     Return in about 3 months (around  07/25/2021) for Follow up of chronic medical conditions.   Pt discussed with Dr. Elwanda Brooklyn, MD Internal Medicine Resident PGY-3 [facility] 04/19/2021 2:56 PM

## 2021-04-19 NOTE — Patient Instructions (Signed)
Mr.Jahmir L Craun, it was a pleasure seeing you today!  Today we discussed:  Heartburn -I have placed a referral to GI for further evaluation -Start protonix 40mg  daily  Left Leg Numbness: I suspect this is related to your post-polio syndrome but spinal stenosis may be playing a role as well -I have placed a referral to neurology for further evaluation  Hypertension -Continue your olmesartan-amlodipine-hctz.  I will call you if there are any major lab abnormalities today.   Follow-up: January 2023  Please make sure to arrive 15 minutes prior to your next appointment. If you arrive late, you may be asked to reschedule.   We look forward to seeing you next time. Please call our clinic at [phone] if you have any questions or concerns. The best time to call is Monday-Friday from 9am-4pm, but there is someone available 24/7. If after hours or the weekend, call the main hospital number and ask for the Internal Medicine Resident On-Call. If you need medication refills, please notify your pharmacy one week in advance and they will send [place] a request.  Thank you for letting us take part in your care. Wishing you the best!

## 2021-04-19 NOTE — Assessment & Plan Note (Addendum)
He notes today that he has been experiencing heartburn symptoms over the past 2 to 3 months.  Symptoms include epigastric pain, reflux, and intermittent nausea.  He also is noting an occasional sensation of something being stuck in his esophagus.  He denies any change in his appetite and weight is actually up 4 pounds from his last visit with me.  He denies melena or hematochezia.  No prior similar symptoms in the past.  Since this is new onset and he is experiencing sensation of food getting stuck in his esophagus, we will refer him to GI for EGD  Start Protonix 40 mg daily  Reviewed supportive measures for gastric reflux

## 2021-04-19 NOTE — Assessment & Plan Note (Addendum)
He continues to experience similar neurological symptoms as noted from his visit with me in June. We revisted the natural history of PPS and discussed that his spinal stenosis may be contributing. He remains adamant that he would not want surgical intervention for correction of spinal stenosis, so we will defer MRI imaging at this time. As far as I found in literature review, no treatment exists for this however, based on his concern regarding his symptoms, I would appreciate neurology's input.  Referral to neurology placed

## 2021-04-19 NOTE — Assessment & Plan Note (Signed)
He notes the development of 2 different skin lesions on his right leg (upper thigh and calf) over the period of the last couple years.  See photos under media tab.  They do not appear to have any major alarming features however we will keep an eye on them and refer to dermatology if a greater concern arises.

## 2021-04-19 NOTE — Assessment & Plan Note (Signed)
He continues to smoke roughly half a pack of cigarettes a day.  He acknowledges that he needs to quit smoking however does not seem overly invested in this at this time as he is primarily concerned about the left leg numbness and acid reflux.  We will continue ongoing discussions about smoking cessation over the next year

## 2021-04-19 NOTE — Assessment & Plan Note (Signed)
Declines influenza vaccination today.  He is aware he can arrange a nurse only visit in the future to get this done here He has received appropriate COVID-19 vaccination including 2 vaccines and 1 posterior. He received 1 shingles vaccine but has not received the other.  He is aware to return to the pharmacy to get this done.

## 2021-04-20 ENCOUNTER — Encounter: Payer: Self-pay | Admitting: Internal Medicine

## 2021-04-20 LAB — CBC
Hematocrit: 38.9 % (ref 37.5–51.0)
Hemoglobin: 13.3 g/dL (ref 13.0–17.7)
MCH: 29.9 pg (ref 26.6–33.0)
MCHC: 34.2 g/dL (ref 31.5–35.7)
MCV: 87 fL (ref 79–97)
Platelets: 303 10*3/uL (ref 150–450)
RBC: 4.45 x10E6/uL (ref 4.14–5.80)
RDW: 12.8 % (ref 11.6–15.4)
WBC: 7.4 10*3/uL (ref 3.4–10.8)

## 2021-04-20 LAB — BASIC METABOLIC PANEL
BUN/Creatinine Ratio: 13 (ref 10–24)
BUN: 19 mg/dL (ref 8–27)
CO2: 20 mmol/L (ref 20–29)
Calcium: 10.4 mg/dL — ABNORMAL HIGH (ref 8.6–10.2)
Chloride: 102 mmol/L (ref 96–106)
Creatinine, Ser: 1.41 mg/dL — ABNORMAL HIGH (ref 0.76–1.27)
Glucose: 84 mg/dL (ref 70–99)
Potassium: 3.9 mmol/L (ref 3.5–5.2)
Sodium: 141 mmol/L (ref 134–144)
eGFR: 56 mL/min/{1.73_m2} — ABNORMAL LOW (ref >59)

## 2021-04-20 NOTE — Progress Notes (Signed)
Internal Medicine Clinic Attending  Case discussed with Dr. Christian  At the time of the visit.  We reviewed the resident's history and exam and pertinent patient test results.  I agree with the assessment, diagnosis, and plan of care documented in the resident's note.  

## 2021-04-20 NOTE — Assessment & Plan Note (Addendum)
Calcium is very mildly elevated on his labs today to 10.4. renal function is stable, no anemia on labs so low suspicion for myeloproliferative disease. Cancer screening is up to date. Will plan to evaluate on labs at his next office visit. If it remains elevated, can consider pursuing further workup.

## 2021-04-24 ENCOUNTER — Encounter: Payer: Self-pay | Admitting: Neurology

## 2021-05-29 ENCOUNTER — Other Ambulatory Visit: Payer: Self-pay | Admitting: Internal Medicine

## 2021-05-30 NOTE — Telephone Encounter (Signed)
Olmesartan-amLODIPine-HCTZ 40-10-25 MG TABS, refill request @ [facility] ([place]), [place] - [address]

## 2021-05-31 ENCOUNTER — Other Ambulatory Visit: Payer: Self-pay | Admitting: Internal Medicine

## 2021-06-19 ENCOUNTER — Other Ambulatory Visit: Payer: Self-pay | Admitting: Internal Medicine

## 2021-06-20 ENCOUNTER — Other Ambulatory Visit: Payer: Self-pay | Admitting: *Deleted

## 2021-06-21 MED ORDER — OLMESARTAN-AMLODIPINE-HCTZ 40-10-25 MG PO TABS: 1.0000 | ORAL_TABLET | Freq: Every day | ORAL | 0 refills | Status: DC

## 2021-07-03 ENCOUNTER — Other Ambulatory Visit: Payer: Self-pay

## 2021-07-03 ENCOUNTER — Encounter: Payer: Self-pay | Admitting: Neurology

## 2021-07-03 ENCOUNTER — Ambulatory Visit: Payer: Medicare Other | Admitting: Neurology

## 2021-07-03 VITALS — BP 131/86 | HR 85 | Ht 73.5 in | Wt 188.0 lb

## 2021-07-03 DIAGNOSIS — B91 Sequelae of poliomyelitis: Secondary | ICD-10-CM | POA: Diagnosis not present

## 2021-07-03 DIAGNOSIS — M48062 Spinal stenosis, lumbar region with neurogenic claudication: Secondary | ICD-10-CM

## 2021-07-03 NOTE — Progress Notes (Signed)
[facility] Clinic Note - Initial Visit   Date: 07/03/21  Jonathan [name] MRN: [id number] DOB: 1956-04-28   Dear Humberto Seals:  Thank you for your kind referral of Jonathan [name] for consultation of leg weakness. Although his history is well known to you, please allow [place] to reiterate it for the purpose of our medical record. The patient was accompanied to the clinic by self.   History of Present Illness: Jonathan [name] is a 65 y.o. right-handed male with hypertension, GERD, and post-polio syndrome presenting for evaluation left leg weakness.  He developed polio at birth and has residual left leg weakness, including left foot drop.  He wore a brace as a child and continues to have one, but does not wear it regularly.    Starting around 2018, he began having numbness/tingling and buckling sensation especially in the left leg. Prolonged standing and walking tends to trigger his leg to get numbness, so he has restricted walking. He also has low back pain and cramps. His weakness in the leg has become worse, especially with raising the left.  Foot drop has been unchanged.  No bowel or bladder dysfunction.  MRI lumbar spine from 02/11/2017 was personally viewed and shows severe spinal canal stenosis at L3-4. Patient was evaluated by neurosurgery, who recommended surgery, but he is not interested in this.   Out-side paper records, electronic medical record, and images have been reviewed where available and summarized as:  MRI brain wwo contrast 12/27/2020: No white matter disease or other explanation for symptoms.  MRI cervical spine 12/27/2020: 1. Generalized degenerative disease with reversal of cervical lordosis and multilevel listhesis. 2. Mild spinal stenosis at C4-5 and C5-6. 3. Right foraminal impingement at C4-5 to C6-7. 4. Left foraminal impingement at C3-4, C5-6, and C6-7.  MRI lumbar spine 02/11/2017: 1. At L2-3 there is a mild broad-based disc bulge  with a left lateral annular fissure. 2. At L3-4 there is a broad-based disc bulge flattening the ventral thecal sac. Severe bilateral facet arthropathy. Severe spinal stenosis. Severe right foraminal stenosis. Mild left foraminal stenosis. 3. At L4-5 there is a broad-based disc bulge. Mild bilateral facet arthropathy, left greater than right. Left lateral recess narrowing. Mild spinal stenosis. Moderate left foraminal stenosis. No right foraminal stenosis.   Lab Results  Component Value Date   HGBA1C 5.0 08/29/2017   Lab Results  Component Value Date   ACZYSAYT01 601 11/03/2020   Lab Results  Component Value Date   TSH 0.902 11/03/2020   No results found for: ESRSEDRATE, POCTSEDRATE  Past Medical History:  Diagnosis Date   Allergy    Anxiety    Blood transfusion without reported diagnosis    as baby   Chronic Bil Foot Pain 05/27/2011   Lt > Rt - since age 68 2/2 to post polio syndrome  Corns and callosities on both feet  Reconstruction surgery at age 62. Uses a brace on left leg for many years On prn tylenol for pain  No candidate for opiates due to active substance abuse Podiatry and PT (for brace eval) on 04/23/2013     Chronic Bil Foot Pain 05/27/2011   Lt > Rt - since age 25 2/2 to post polio syndrome  Corns and callosities on both feet  Reconstruction surgery at age 38. Uses a brace on left leg for many years On prn tylenol for pain  No candidate for opiates due to active substance abuse Podiatry and PT (for brace eval) on 04/23/2013  Chronic viral hepatitis C ([place]) 06/05/2006   History of blood transfusion in childhood Dx 2010 Reports history of Rx while in [place], unknown yr Elevated LFTs since 2010 and repeat 2014 U/S 2009 - no cirrhosis, repeat in 12/2013 >> normal.  Fibrosure >> pending results  Referred to Hep C clinic 04/23/2013 >> appt in 06/2013 Hep C clinic [phone] Immunity to hep A per labs at Hep C clinic  Started on Hep B series 1 st dose 02/03/2014, 2 dose given  04/23/2014. Last dose about 09/2014.  Follow with Hep C clinic        ERECTILE DYSFUNCTION 06/05/2006   Qualifier: Diagnosis of  By: Grandville Silos MD, Daniel     Essential hypertension 06/05/2006   Dx in 2008  Has BP machine at home  Checks erratically  Chronic no show issues       GERD (gastroesophageal reflux disease)    occ s/s   Hypertension    Polysubstance abuse ([place]) 04/23/2013   Cocaine (sniffs powder) and marijuana No IVDU    POST-POLIO SYNDROME 06/05/2006   Hx of polio at age 36 Has spasms in his legs on and off Used Flexeril on and off for spasms 2015 May >> sent to rehab/PT. rec new leg braces/ortho      Past Surgical History:  Procedure Laterality Date   COLONOSCOPY     FRACTURE SURGERY     KNEE ARTHROSCOPY Right      Medications:  Outpatient Encounter Medications as of 07/03/2021  Medication Sig   atorvastatin (LIPITOR) 20 MG tablet Take 1 tablet by mouth once daily   Olmesartan-amLODIPine-HCTZ 40-10-25 MG TABS Take 1 tablet by mouth daily.   pantoprazole (PROTONIX) 40 MG tablet Take 1 tablet by mouth once daily   No facility-administered encounter medications on file as of 07/03/2021.    Allergies: No Known Allergies  Family History: Family History  Problem Relation Age of Onset   Cancer Mother    Breast cancer Mother    Colon cancer Neg Hx    Colon polyps Neg Hx     Social History: Social History   Tobacco Use   Smoking status: Every Day    Packs/day: 0.50    Years: 35.00    Pack years: 17.50    Types: Cigarettes   Smokeless tobacco: Never   Tobacco comments:    .5 PPD  Substance Use Topics   Alcohol use: Yes    Alcohol/week: 21.0 standard drinks    Types: 21 Standard drinks or equivalent per week    Comment: beer everyday   Drug use: Yes    Types: Marijuana    Comment: marijuana   Social History   Social History Narrative   Right Handed    Lives in a one story home       Does not know his father   Has 2 daughters and 2 sons.    Vital  Signs:  BP 131/86   Pulse 85   Ht 6' 1.5" (1.867 m)   Wt 188 lb (85.3 kg)   SpO2 98%   BMI 24.47 kg/m   Neurological Exam: MENTAL STATUS including orientation to time, place, person, recent and remote memory, attention span and concentration, language, and fund of knowledge is normal.  Speech is not dysarthric.  CRANIAL NERVES: II:  No visual field defects.   III-IV-VI: Pupils equal round and reactive to light.  Normal conjugate, extra-ocular eye movements in all directions of gaze.  No nystagmus.  No ptosis.  V:  Normal facial sensation.    VII:  Normal facial symmetry and movements.   VIII:  Normal hearing and vestibular function.   IX-X:  Normal palatal movement.   XI:  Normal shoulder shrug and head rotation.   XII:  Normal tongue strength and range of motion, no deviation or fasciculation.  MOTOR:  Left TA atrophy, No fasciculations or abnormal movements.  No pronator drift.   Upper Extremity:  Right  Left  Deltoid  5/5   5/5   Biceps  5/5   5/5   Triceps  5/5   5/5   Wrist extensors  5/5   5/5   Wrist flexors  5/5   5/5   Finger extensors  5/5   5/5   Finger flexors  5/5   5/5   Dorsal interossei  5/5   5/5   Abductor pollicis  5/5   5/5   Tone (Ashworth scale)  0  0   Lower Extremity:  Right  Left  Hip flexors  5/5   4+/5   Hip extensors  5/5   5/5   Adductor 5/5  4+/5  Abductor 5/5  5/5  Knee flexors  5/5   5/5   Knee extensors  5/5   5/5   Dorsiflexors  5/5   3/5   Plantarflexors  5/5   3/5   Toe extensors  5/5   2/5   Toe flexors  5/5   2/5   Tone (Ashworth scale)  0  0   MSRs:  Right        Left                  brachioradialis 2+  2+  biceps 2+  2+  triceps 2+  2+  patellar 2+  2+  ankle jerk 2+  2+  Hoffman no  no  plantar response down  down   SENSORY:  Normal and symmetric perception of light touch, pinprick, vibration, and proprioception.   COORDINATION/GAIT: Normal finger-to- nose-finger.  Intact rapid alternating movements bilaterally.   He walks unassisted with high steppage gait on the left.  Stable.   IMPRESSION: Severe lumbar canal stenosis at L3-4 causing neurogenic claudication and left leg paresthesias/heaviness. Pt informed that his paresthesias and exertional weakness are not due to post-polio syndrome, the latter presents with only weakness, and therefore his lumbar canal stenosis which is severe is causing his new weakness and paresthesias.  Surgery would prevent numbness, tingling, low back pain, and exertional weakness, but would not improve weakness, which has been longstanding from polio.   - Pt does not want surgical intervention and understand that symptoms may progress and worsen over time  - He is interested in out-patient physical therapy for low back strengthening. Referral will be sent  Return to clinic as needed   Thank you for allowing me to participate in patient's care.  If I can answer any additional questions, I would be pleased to do so.    Sincerely,    Tearia Gibbs K. Posey Pronto, DO

## 2021-07-03 NOTE — Patient Instructions (Addendum)
We will refer you for physical therapy for low back strengthening

## 2021-07-26 ENCOUNTER — Other Ambulatory Visit: Payer: Self-pay | Admitting: Internal Medicine

## 2021-07-26 NOTE — Telephone Encounter (Signed)
Next appt scheduled 06/27/22 with PCP. 

## 2021-07-27 DIAGNOSIS — M5459 Other low back pain: Secondary | ICD-10-CM | POA: Diagnosis not present

## 2021-07-27 DIAGNOSIS — R531 Weakness: Secondary | ICD-10-CM | POA: Diagnosis not present

## 2021-08-01 DIAGNOSIS — R531 Weakness: Secondary | ICD-10-CM | POA: Diagnosis not present

## 2021-08-01 DIAGNOSIS — M5459 Other low back pain: Secondary | ICD-10-CM | POA: Diagnosis not present

## 2021-08-03 DIAGNOSIS — R531 Weakness: Secondary | ICD-10-CM | POA: Diagnosis not present

## 2021-08-03 DIAGNOSIS — M5459 Other low back pain: Secondary | ICD-10-CM | POA: Diagnosis not present

## 2021-08-08 DIAGNOSIS — M5459 Other low back pain: Secondary | ICD-10-CM | POA: Diagnosis not present

## 2021-08-08 DIAGNOSIS — R531 Weakness: Secondary | ICD-10-CM | POA: Diagnosis not present

## 2021-08-10 DIAGNOSIS — M5459 Other low back pain: Secondary | ICD-10-CM | POA: Diagnosis not present

## 2021-08-10 DIAGNOSIS — R531 Weakness: Secondary | ICD-10-CM | POA: Diagnosis not present

## 2021-08-15 DIAGNOSIS — R531 Weakness: Secondary | ICD-10-CM | POA: Diagnosis not present

## 2021-08-15 DIAGNOSIS — M5459 Other low back pain: Secondary | ICD-10-CM | POA: Diagnosis not present

## 2021-08-17 ENCOUNTER — Encounter: Payer: Medicare Other | Admitting: Internal Medicine

## 2021-08-17 DIAGNOSIS — R531 Weakness: Secondary | ICD-10-CM | POA: Diagnosis not present

## 2021-08-17 DIAGNOSIS — M5459 Other low back pain: Secondary | ICD-10-CM | POA: Diagnosis not present

## 2021-08-21 DIAGNOSIS — R531 Weakness: Secondary | ICD-10-CM | POA: Diagnosis not present

## 2021-08-21 DIAGNOSIS — M5459 Other low back pain: Secondary | ICD-10-CM | POA: Diagnosis not present

## 2021-08-22 ENCOUNTER — Ambulatory Visit (INDEPENDENT_AMBULATORY_CARE_PROVIDER_SITE_OTHER): Payer: Medicare HMO | Admitting: Internal Medicine

## 2021-08-22 ENCOUNTER — Encounter: Payer: Self-pay | Admitting: Internal Medicine

## 2021-08-22 VITALS — BP 131/79 | HR 62 | Wt 189.3 lb

## 2021-08-22 DIAGNOSIS — B91 Sequelae of poliomyelitis: Secondary | ICD-10-CM | POA: Diagnosis not present

## 2021-08-22 DIAGNOSIS — R69 Illness, unspecified: Secondary | ICD-10-CM | POA: Diagnosis not present

## 2021-08-22 DIAGNOSIS — I10 Essential (primary) hypertension: Secondary | ICD-10-CM

## 2021-08-22 DIAGNOSIS — F17200 Nicotine dependence, unspecified, uncomplicated: Secondary | ICD-10-CM | POA: Diagnosis not present

## 2021-08-22 DIAGNOSIS — K219 Gastro-esophageal reflux disease without esophagitis: Secondary | ICD-10-CM

## 2021-08-22 MED ORDER — NICOTINE POLACRILEX 4 MG MT LOZG: 4.0000 mg | LOZENGE | OROMUCOSAL | 0 refills | Status: DC | PRN

## 2021-08-22 MED ORDER — OLMESARTAN-AMLODIPINE-HCTZ 40-10-25 MG PO TABS: 1.0000 | ORAL_TABLET | Freq: Every day | ORAL | 1 refills | Status: DC

## 2021-08-22 MED ORDER — PANTOPRAZOLE SODIUM 40 MG PO TBEC: 40.0000 mg | DELAYED_RELEASE_TABLET | Freq: Every day | ORAL | 1 refills | Status: DC

## 2021-08-22 MED ORDER — NICOTINE 21 MG/24HR TD PT24: 21.0000 mg | MEDICATED_PATCH | TRANSDERMAL | 1 refills | Status: AC

## 2021-08-22 MED ORDER — ATORVASTATIN CALCIUM 20 MG PO TABS: 20.0000 mg | ORAL_TABLET | Freq: Every day | ORAL | 3 refills | Status: DC

## 2021-08-22 NOTE — Progress Notes (Signed)
Internal Medicine Clinic Attending  Case discussed with Dr. Christian  At the time of the visit.  We reviewed the resident's history and exam and pertinent patient test results.  I agree with the assessment, diagnosis, and plan of care documented in the resident's note.  

## 2021-08-22 NOTE — Progress Notes (Signed)
Office Visit   Patient ID: Jonathan [name], male    DOB: 13-Apr-1956, 66 y.o.   MRN: [id number]   PCP: Mitzi Hansen, MD   Subjective:  Jonathan [name] is a 66 y.o. year old male who presents for follow up of hypertension and tobacco cessation.  LOV with me was September 2022. -he had noted more issues with left leg numbness and weakness. Referred to neurology -new onset heartburn. Started on protonix and referred to GI  Interm history 06/2021: Seen by Dr. Posey Pronto with neurology who felt that leg numbness was more attributable to known spinal stenosis rather than post polio. Referred to PT  He has no acute complaints at today's visit.   He has been attending PT twice weekly since seeing neurology. He feels that this has been beneficial for him and notes that he no longer experiences numbness in his left leg.    ACTIVE MEDICATIONS   Outpatient Medications Prior to Visit  Medication Sig   [DISCONTINUED] atorvastatin (LIPITOR) 20 MG tablet Take 1 tablet by mouth once daily   [DISCONTINUED] Olmesartan-amLODIPine-HCTZ 40-10-25 MG TABS Take 1 tablet by mouth daily.   [DISCONTINUED] pantoprazole (PROTONIX) 40 MG tablet Take 1 tablet by mouth once daily   No facility-administered medications prior to visit.   Objective:   BP 131/79 (BP Location: Left Arm, Patient Position: Sitting, Cuff Size: Normal)    Pulse 62    Wt 189 lb 4.8 oz (85.9 kg)    SpO2 100%    BMI 24.64 kg/m  Wt Readings from Last 3 Encounters:  08/22/21 189 lb 4.8 oz (85.9 kg)  07/03/21 188 lb (85.3 kg)  04/19/21 188 lb 4.8 oz (85.4 kg)    BP Readings from Last 3 Encounters:  08/22/21 131/79  07/03/21 131/86  04/19/21 138/77   General: well appearing, no distress Cardiac: RRR, no LE edema Pulm: lungs clear throughout Gait: analgesic gait  Assessment & Plan:     Essential hypertension - Primary (Chronic)    Current medications: olmesartan-amlodipine-hctz 40-10-25mg  daily Blood pressure is at goal in  the office today--131/79. Denies adverse medication effects. Reports medication adherence, congruent with dispense report. Continue current management Follow up 6 months      Gastroesophageal reflux disease    Medications:Protonix 40mg  daily Symptomatically improved after starting protonix at [place]. He has not followed up with GI as previously discussed. Denies melena, hematochezia, dysphagia, odyndophagia.   Encouraged f/u with GI Protonix refilled and sent to pharmacy       POST-POLIO SYNDROME (Chronic)    Seen by neurology in October who felt that lumbar stenosis was the primary cause to his numbness in his leg and note that post-polio syndrome is purely weakness. He was referred to PT.  He has been attending PT 2 x/week and reports resolution of left leg numbness. One session is stretching, the other aquatics. He feels that PT has been beneficial but feels that he could continue doing the exercising and stretching on his own. He plans on signing of for silver sneakers at the [facility].      Tobacco use disorder (Chronic)    He is currently smoking 0.5 pack of cigarettes per day. He is interested in cessation. We discussed the various treatment options and he has elected to go for nicotine replacement therapy. In terms of assistance with medication cost, I encouraged him to call the 1-800-QUIT-NOW line. Exactly 7 minutes were spent on smoking cessation counseling. Will start him on 21mg  nicotine patches and  prn lozenges.  He will touch base with me in 2 weeks to let me know how he is doing with this      Return in about 6 months (around 02/19/2022).  Pt discussed with Dr. Tressa Busman, MD Internal Medicine Resident PGY-3 [facility] 08/22/2021 11:48 AM

## 2021-08-22 NOTE — Patient Instructions (Signed)
[name], it was a pleasure seeing you today!  Today we discussed:  Heartburn -I have sent in a refill on the protonix -I would like you to follow up with the gastroenterologist (stomach doctor) for additional evaluation  Smoking -Start using nicotine patches each day. When you have increased cravings during times that you would typically smoke, use a nicotine lozenge instead. -I would like to touch base with you 2-4 weeks into this to see how you are doing with it and if there is anything else we should do  Hypertension -Your blood pressure looks great! Continue taking your current medications  Spinal stenosis -Check with the [facility] and inquire into the silver sneakers program. If your symptoms return, return to PT.  Follow-up: 6 months   Please make sure to arrive 15 minutes prior to your next appointment. If you arrive late, you may be asked to reschedule.   We look forward to seeing you next time. Please call our clinic at [phone] if you have any questions or concerns. The best time to call is Monday-Friday from 9am-4pm, but there is someone available 24/7. If after hours or the weekend, call the main hospital number and ask for the Internal Medicine Resident On-Call. If you need medication refills, please notify your pharmacy one week in advance and they will send [place] a request.  Thank you for letting us take part in your care. Wishing you the best!

## 2021-08-22 NOTE — Assessment & Plan Note (Signed)
Medications:Protonix 40mg  daily Symptomatically improved after starting protonix at [place]. He has not followed up with GI as previously discussed. Denies melena, hematochezia, dysphagia, odyndophagia.    Encouraged f/u with GI  Protonix refilled and sent to pharmacy

## 2021-08-22 NOTE — Assessment & Plan Note (Addendum)
Current medications: olmesartan-amlodipine-hctz 40-10-25mg  daily Blood pressure is at goal in the office today--131/79. Denies adverse medication effects. Reports medication adherence, congruent with dispense report.  Continue current management  Follow up 6 months

## 2021-08-22 NOTE — Assessment & Plan Note (Addendum)
Seen by neurology in October who felt that lumbar stenosis was the primary cause to his numbness in his leg and note that post-polio syndrome is purely weakness. He was referred to PT.  He has been attending PT 2 x/week and reports resolution of left leg numbness. One session is stretching, the other aquatics. He feels that PT has been beneficial but feels that he could continue doing the exercising and stretching on his own. He plans on signing of for silver sneakers at the [facility] [employer]

## 2021-08-22 NOTE — Assessment & Plan Note (Signed)
He is currently smoking 0.5 pack of cigarettes per day. He is interested in cessation. We discussed the various treatment options and he has elected to go for nicotine replacement therapy. In terms of assistance with medication cost, I encouraged him to call the 1-800-QUIT-NOW line.   Will start him on 21mg  nicotine patches and prn lozenges.   He will touch base with me in 2 weeks to let me know how he is doing with this

## 2021-08-23 LAB — CMP14 + ANION GAP
ALT: 22 IU/L (ref 0–44)
AST: 26 IU/L (ref 0–40)
Albumin/Globulin Ratio: 2 (ref 1.2–2.2)
Albumin: 4.6 g/dL (ref 3.8–4.8)
Alkaline Phosphatase: 67 IU/L (ref 44–121)
Anion Gap: 14 mmol/L (ref 10.0–18.0)
BUN/Creatinine Ratio: 17 (ref 10–24)
BUN: 22 mg/dL (ref 8–27)
Bilirubin Total: 0.2 mg/dL (ref 0.0–1.2)
CO2: 21 mmol/L (ref 20–29)
Calcium: 9.2 mg/dL (ref 8.6–10.2)
Chloride: 110 mmol/L — ABNORMAL HIGH (ref 96–106)
Creatinine, Ser: 1.28 mg/dL — ABNORMAL HIGH (ref 0.76–1.27)
Globulin, Total: 2.3 g/dL (ref 1.5–4.5)
Glucose: 110 mg/dL — ABNORMAL HIGH (ref 70–99)
Potassium: 3.8 mmol/L (ref 3.5–5.2)
Sodium: 145 mmol/L — ABNORMAL HIGH (ref 134–144)
Total Protein: 6.9 g/dL (ref 6.0–8.5)
eGFR: 63 mL/min/{1.73_m2} (ref >59)

## 2021-08-23 NOTE — Progress Notes (Signed)
Lab results and follow up recommendations discussed with patient.

## 2021-08-24 DIAGNOSIS — R531 Weakness: Secondary | ICD-10-CM | POA: Diagnosis not present

## 2021-08-24 DIAGNOSIS — M5459 Other low back pain: Secondary | ICD-10-CM | POA: Diagnosis not present

## 2021-08-28 DIAGNOSIS — R531 Weakness: Secondary | ICD-10-CM | POA: Diagnosis not present

## 2021-08-28 DIAGNOSIS — M5459 Other low back pain: Secondary | ICD-10-CM | POA: Diagnosis not present

## 2021-09-01 DIAGNOSIS — R531 Weakness: Secondary | ICD-10-CM | POA: Diagnosis not present

## 2021-09-01 DIAGNOSIS — M5459 Other low back pain: Secondary | ICD-10-CM | POA: Diagnosis not present

## 2021-09-08 DIAGNOSIS — M5459 Other low back pain: Secondary | ICD-10-CM | POA: Diagnosis not present

## 2021-09-08 DIAGNOSIS — R531 Weakness: Secondary | ICD-10-CM | POA: Diagnosis not present

## 2021-09-12 DIAGNOSIS — R531 Weakness: Secondary | ICD-10-CM | POA: Diagnosis not present

## 2021-09-12 DIAGNOSIS — M5459 Other low back pain: Secondary | ICD-10-CM | POA: Diagnosis not present

## 2021-10-03 DIAGNOSIS — R32 Unspecified urinary incontinence: Secondary | ICD-10-CM | POA: Diagnosis not present

## 2021-10-03 DIAGNOSIS — B91 Sequelae of poliomyelitis: Secondary | ICD-10-CM | POA: Diagnosis not present

## 2021-10-03 DIAGNOSIS — E785 Hyperlipidemia, unspecified: Secondary | ICD-10-CM | POA: Diagnosis not present

## 2021-10-03 DIAGNOSIS — Z791 Long term (current) use of non-steroidal anti-inflammatories (NSAID): Secondary | ICD-10-CM | POA: Diagnosis not present

## 2021-10-03 DIAGNOSIS — R269 Unspecified abnormalities of gait and mobility: Secondary | ICD-10-CM | POA: Diagnosis not present

## 2021-10-03 DIAGNOSIS — K219 Gastro-esophageal reflux disease without esophagitis: Secondary | ICD-10-CM | POA: Diagnosis not present

## 2021-10-03 DIAGNOSIS — Z008 Encounter for other general examination: Secondary | ICD-10-CM | POA: Diagnosis not present

## 2021-10-03 DIAGNOSIS — Z720 Tobacco use: Secondary | ICD-10-CM | POA: Diagnosis not present

## 2021-10-03 DIAGNOSIS — I10 Essential (primary) hypertension: Secondary | ICD-10-CM | POA: Diagnosis not present

## 2021-10-03 DIAGNOSIS — G8929 Other chronic pain: Secondary | ICD-10-CM | POA: Diagnosis not present

## 2021-10-03 DIAGNOSIS — M8969 Osteopathy after poliomyelitis, multiple sites: Secondary | ICD-10-CM | POA: Diagnosis not present

## 2021-12-14 ENCOUNTER — Ambulatory Visit (INDEPENDENT_AMBULATORY_CARE_PROVIDER_SITE_OTHER): Payer: Medicare HMO | Admitting: Internal Medicine

## 2021-12-14 ENCOUNTER — Encounter: Payer: Self-pay | Admitting: Internal Medicine

## 2021-12-14 VITALS — BP 127/90 | HR 63 | Wt 189.2 lb

## 2021-12-14 DIAGNOSIS — I10 Essential (primary) hypertension: Secondary | ICD-10-CM

## 2021-12-14 DIAGNOSIS — F17210 Nicotine dependence, cigarettes, uncomplicated: Secondary | ICD-10-CM | POA: Diagnosis not present

## 2021-12-14 DIAGNOSIS — K219 Gastro-esophageal reflux disease without esophagitis: Secondary | ICD-10-CM | POA: Diagnosis not present

## 2021-12-14 DIAGNOSIS — F17200 Nicotine dependence, unspecified, uncomplicated: Secondary | ICD-10-CM

## 2021-12-14 DIAGNOSIS — R69 Illness, unspecified: Secondary | ICD-10-CM | POA: Diagnosis not present

## 2021-12-14 MED ORDER — PANTOPRAZOLE SODIUM 40 MG PO TBEC: 40.0000 mg | DELAYED_RELEASE_TABLET | Freq: Every day | ORAL | 1 refills | Status: DC

## 2021-12-14 MED ORDER — OLMESARTAN-AMLODIPINE-HCTZ 40-10-25 MG PO TABS: 1.0000 | ORAL_TABLET | Freq: Every day | ORAL | 1 refills | Status: DC

## 2021-12-14 MED ORDER — VARENICLINE TARTRATE 0.5 MG PO TABS: ORAL_TABLET | ORAL | 0 refills | Status: AC

## 2021-12-14 MED ORDER — ATORVASTATIN CALCIUM 20 MG PO TABS: 20.0000 mg | ORAL_TABLET | Freq: Every day | ORAL | 3 refills | Status: DC

## 2021-12-14 NOTE — Patient Instructions (Addendum)
Jonathan [name], it was a pleasure seeing you today!  Today we discussed:  GERD - Please call the GI office to arrange an appointment [facility] G[facility]enterologist in [place], [facility] Address: [address], [place], [place] [address] Phone: [phone]  Smoking cessation -Call the 1-800 QUIT NOW line to obtain free nicotine patches and lozenges. Use the patch every day. When you crave a cigarette, use a lozenge. -Start Chantix. If this is not covered by your insurance or if you develop size effects from it, please let me know so we can try an alternative.   I will be graduating in June. You will be assigned a new PCP through our clinic who you will hopefully see at your next appointment in 6 months.   Thank you so much for placing your trust in me as your PCP and it has been an absolute pleasure getting to know you over the past few years. I wish you only but the very best!   Follow-up: 6 months   Please make sure to arrive 15 minutes prior to your next appointment. If you arrive late, you may be asked to reschedule.   We look forward to seeing you next time. Please call our clinic at [phone] if you have any questions or concerns. The best time to call is Monday-Friday from 9am-4pm, but there is someone available 24/7. If after hours or the weekend, call the main hospital number and ask for the Internal Medicine Resident On-Call. If you need medication refills, please notify your pharmacy one week in advance and they will send [place] a request.  Thank you for letting us take part in your care. Wishing you the best!

## 2021-12-14 NOTE — Progress Notes (Signed)
   Office Visit   Patient ID: NYGEL PROKOP, male    DOB: March 30, 1956, 66 y.o.   MRN: [id number]   PCP: Mitzi Hansen, MD   Subjective:   Jonathan [name] is a 66 y.o. year old male who presents for follow up of hypertension, GERD, and tobacco use disorder.  Please refer to problem based charting for assessment and plan.   ACTIVE MEDICATIONS   Lipitor '20mg'$  daily Protonix '40mg'$  daily Olmesartan-amlodipine-hctz 40-10-'25mg'$  daily  Objective:   BP 127/90 (BP Location: Left Arm, Patient Position: Sitting, Cuff Size: Normal)   Pulse 63   Wt 189 lb 3.2 oz (85.8 kg)   SpO2 100%   BMI 24.62 kg/m  Wt Readings from Last 3 Encounters:  12/14/21 189 lb 3.2 oz (85.8 kg)  08/22/21 189 lb 4.8 oz (85.9 kg)  07/03/21 188 lb (85.3 kg)    BP Readings from Last 3 Encounters:  12/14/21 127/90  08/22/21 131/79  07/03/21 131/86   General: well appearing in no distress Cardiac: RRR Pulm: lungs clear   Assessment & Plan:   Problem List Items Addressed This Visit       Cardiovascular and Mediastinum   Essential hypertension - Primary (Chronic)    Chronic and stable. BP 127/90.  -Continue olmesartan-amlodipine-hctz 40-10-'25mg'$  daily       Relevant Orders   Lipid panel   Basic metabolic panel     Digestive   Gastroesophageal reflux disease    Symptoms are persistent but stable since LOV. He missed the call from the GI office.  -Contact information provided for GI office  -Continue Protonix         Other   Tobacco use disorder (Chronic)    9 minutes of today's visit was spent on smoking cessation counseling. He continues to smoke cigarettes--1/2 ppd. He is ready to undergo cessation. -he will call the 1-800-QUIT NOW line for patches and lozenges. -Start chantix -follow up in 4-8w -does not qualify for lung cancer screening         Return in about 6 months (around 06/16/2022).   Pt discussed with Dr. Adolm Joseph, MD Internal Medicine Resident  PGY-3 [facility] 12/14/2021 10:41 AM

## 2021-12-14 NOTE — Assessment & Plan Note (Signed)
Symptoms are persistent but stable since LOV. He missed the call from the GI office.  -Contact information provided for GI office  -Continue Protonix

## 2021-12-14 NOTE — Assessment & Plan Note (Addendum)
9 minutes of today's visit was spent on smoking cessation counseling. He continues to smoke cigarettes--1/2 ppd. He is ready to undergo cessation. -he will call the 1-800-QUIT NOW line for patches and lozenges. -Start chantix -follow up in 4-8w -does not qualify for lung cancer screening

## 2021-12-14 NOTE — Assessment & Plan Note (Addendum)
Chronic and stable. BP 127/90.  -Continue olmesartan-amlodipine-hctz 40-10-'25mg'$  daily

## 2021-12-15 LAB — BASIC METABOLIC PANEL
BUN/Creatinine Ratio: 13 (ref 10–24)
BUN: 17 mg/dL (ref 8–27)
CO2: 23 mmol/L (ref 20–29)
Calcium: 9.6 mg/dL (ref 8.6–10.2)
Chloride: 107 mmol/L — ABNORMAL HIGH (ref 96–106)
Creatinine, Ser: 1.28 mg/dL — ABNORMAL HIGH (ref 0.76–1.27)
Glucose: 88 mg/dL (ref 70–99)
Potassium: 3.8 mmol/L (ref 3.5–5.2)
Sodium: 146 mmol/L — ABNORMAL HIGH (ref 134–144)
eGFR: 62 mL/min/{1.73_m2} (ref >59)

## 2021-12-15 LAB — LIPID PANEL
Chol/HDL Ratio: 2.4 ratio (ref 0.0–5.0)
Cholesterol, Total: 120 mg/dL (ref 100–199)
HDL: 49 mg/dL (ref >39)
LDL Chol Calc (NIH): 45 mg/dL (ref 0–99)
Triglycerides: 154 mg/dL — ABNORMAL HIGH (ref 0–149)
VLDL Cholesterol Cal: 26 mg/dL (ref 5–40)

## 2021-12-19 ENCOUNTER — Encounter: Payer: Self-pay | Admitting: Internal Medicine

## 2021-12-20 NOTE — Progress Notes (Signed)
Internal Medicine Clinic Attending  Case discussed with Dr. Christian  At the time of the visit.  We reviewed the resident's history and exam and pertinent patient test results.  I agree with the assessment, diagnosis, and plan of care documented in the resident's note.  

## 2022-05-21 ENCOUNTER — Ambulatory Visit (INDEPENDENT_AMBULATORY_CARE_PROVIDER_SITE_OTHER): Payer: Medicare HMO | Admitting: Student

## 2022-05-21 ENCOUNTER — Encounter: Payer: Self-pay | Admitting: Student

## 2022-05-21 VITALS — BP 118/80 | HR 78 | Temp 98.1°F | Wt 181.2 lb

## 2022-05-21 DIAGNOSIS — G44209 Tension-type headache, unspecified, not intractable: Secondary | ICD-10-CM | POA: Diagnosis not present

## 2022-05-21 DIAGNOSIS — M791 Myalgia, unspecified site: Secondary | ICD-10-CM | POA: Diagnosis not present

## 2022-05-21 MED ORDER — NAPROXEN 500 MG PO TABS: 500.0000 mg | ORAL_TABLET | Freq: Two times a day (BID) | ORAL | 0 refills | Status: AC

## 2022-05-21 NOTE — Patient Instructions (Signed)
[name], it was a pleasure seeing you today!  Today we discussed: - Tension: I would like for you to take naproxen for your neck pain/headaches. Take '500mg'$ , once in the morning and once in the evening for the next few days. It will also be important for you to avoid any triggers or stressors that could be causing this.   - Your primary care physician will be in clinic November 20 - December 17 - you can make an appointment during this time.  I have ordered the following medication/changed the following medications:   Start the following medications: Meds ordered this encounter  Medications   naproxen (NAPROSYN) 500 MG tablet    Sig: Take 1 tablet (500 mg total) by mouth 2 (two) times daily with a meal for 5 days.    Dispense:  10 tablet    Refill:  0     Follow-up:  if symptoms do not resolve    Please make sure to arrive 15 minutes prior to your next appointment. If you arrive late, you may be asked to reschedule.   We look forward to seeing you next time. Please call our clinic at [phone] if you have any questions or concerns. The best time to call is Monday-Friday from 9am-4pm, but there is someone available 24/7. If after hours or the weekend, call the main hospital number and ask for the Internal Medicine Resident On-Call. If you need medication refills, please notify your pharmacy one week in advance and they will send [place] a request.  Thank you for letting us take part in your care. Wishing you the best!  Thank you, Sanjuan Dame, MD

## 2022-05-22 DIAGNOSIS — M791 Myalgia, unspecified site: Secondary | ICD-10-CM | POA: Insufficient documentation

## 2022-05-22 NOTE — Progress Notes (Signed)
CC: neck tension  HPI:  Jonathan [name] is a 66 y.o. person with medical history as below presenting to [facility] for neck tension.  Please see problem-based list for further details, assessments, and plans.  Past Medical History:  Diagnosis Date   Allergy    Anxiety    Blood transfusion without reported diagnosis    as baby   Chronic Bil Foot Pain 05/27/2011   Lt > Rt - since age 67 2/2 to post polio syndrome  Corns and callosities on both feet  Reconstruction surgery at age 61. Uses a brace on left leg for many years On prn tylenol for pain  No candidate for opiates due to active substance abuse Podiatry and PT (for brace eval) on 04/23/2013     Chronic Bil Foot Pain 05/27/2011   Lt > Rt - since age 63 2/2 to post polio syndrome  Corns and callosities on both feet  Reconstruction surgery at age 81. Uses a brace on left leg for many years On prn tylenol for pain  No candidate for opiates due to active substance abuse Podiatry and PT (for brace eval) on 04/23/2013    Chronic viral hepatitis C ([name]) 06/05/2006   History of blood transfusion in childhood Dx 2010 Reports history of Rx while in [place], unknown yr Elevated LFTs since 2010 and repeat 2014 U/S 2009 - no cirrhosis, repeat in 12/2013 >> normal.  Fibrosure >> pending results  Referred to Hep C clinic 04/23/2013 >> appt in 06/2013 Hep C clinic [phone] Immunity to hep A per labs at Hep C clinic  Started on Hep B series 1 st dose 02/03/2014, 2 dose given 04/23/2014. Last dose about 09/2014.  Follow with Hep C clinic        ERECTILE DYSFUNCTION 06/05/2006   Qualifier: Diagnosis of  By: Grandville Silos MD, Daniel     Essential hypertension 06/05/2006   Dx in 2008  Has BP machine at home  Checks erratically  Chronic no show issues       GERD (gastroesophageal reflux disease)    occ s/s   Hypertension    Polysubstance abuse ([place]) 04/23/2013   Cocaine (sniffs powder) and marijuana No IVDU    POST-POLIO SYNDROME 06/05/2006   Hx of polio at age 56  Has spasms in his legs on and off Used Flexeril on and off for spasms 2015 May >> sent to rehab/PT. rec new leg braces/ortho     Review of Systems:  As per HPI  Physical Exam:  Vitals:   05/21/22 1357 05/21/22 1425  BP: (!) 116/95 118/80  Pulse: 91 78  Temp: 98.1 F (36.7 C)   TempSrc: Oral   SpO2: 100%   Weight: 181 lb 3.2 oz (82.2 kg)    General: Resting comfortably in no acute distress. CV: Regular rate, rhythm. No murmurs. Pulm: Normal work of breathing on room air. Clear to ausculation bilaterally.  MSK: No cervical bony tenderness. Neck passive and active range of motion normal. Bilateral shoulder joints without bony tenderness.  Skin: Awake, alert, conversing appropriately.  Neuro: Awake, alert, conversing appropriately. Motor 5/5 throughout, sensation in tact throughout.   Assessment & Plan:   Muscle tension pain Mr. [name] is reporting to clinic today to discuss recent head and neck pain over the last few weeks. He does note that he has a significant history of cervical spinal stenosis with lower extremity radiculopathy, but this pain has been different. Describes dull, achy pain in his neck and throughout his  head that intermittently occurs throughout the day. Mentions his children often try and depend on him for their problems, which increases his stress. States that this problem has increased recently over the last few weeks. Has tried a couple ibuprofen which has offered him some relief.  Discussed with [name] he is likely experiencing tension headaches and tension pain in his neck. We discussed avoiding triggers and stressors and different ways to handle stress, including exercise. Will prescribe short course of NSAID's as needed. Discussed potential side effects and harmful effects of NSAID's, patient verbalized understanding.  - Avoidance of stressors - Naproxen '500mg'$  twice daily as needed x5d  Patient discussed with Dr. Andrey Spearman, MD Internal  Medicine PGY-3 Pager: [phone]

## 2022-05-22 NOTE — Assessment & Plan Note (Signed)
Mr. Tengan is reporting to clinic today to discuss recent head and neck pain over the last few weeks. He does note that he has a significant history of cervical spinal stenosis with lower extremity radiculopathy, but this pain has been different. Describes dull, achy pain in his neck and throughout his head that intermittently occurs throughout the day. Mentions his children often try and depend on him for their problems, which increases his stress. States that this problem has increased recently over the last few weeks. Has tried a couple ibuprofen which has offered him some relief.  Discussed with Mr. Rivero he is likely experiencing tension headaches and tension pain in his neck. We discussed avoiding triggers and stressors and different ways to handle stress, including exercise. Will prescribe short course of NSAID's as needed. Discussed potential side effects and harmful effects of NSAID's, patient verbalized understanding.  - Avoidance of stressors - Naproxen '500mg'$  twice daily as needed x5d

## 2022-05-27 NOTE — Progress Notes (Signed)
Internal Medicine Clinic Attending ? ?Case discussed with Dr. Braswell  at the time of the visit.  We reviewed the resident?s history and exam and pertinent patient test results.  I agree with the assessment, diagnosis, and plan of care documented in the resident?s note.  ?

## 2022-08-13 ENCOUNTER — Encounter: Payer: Medicare HMO | Admitting: Internal Medicine

## 2022-09-10 ENCOUNTER — Other Ambulatory Visit: Payer: Self-pay | Admitting: Internal Medicine

## 2022-09-23 ENCOUNTER — Other Ambulatory Visit: Payer: Self-pay | Admitting: Internal Medicine

## 2022-10-08 ENCOUNTER — Ambulatory Visit (INDEPENDENT_AMBULATORY_CARE_PROVIDER_SITE_OTHER): Payer: Medicare Other | Admitting: Internal Medicine

## 2022-10-08 ENCOUNTER — Ambulatory Visit: Payer: Medicare Other

## 2022-10-08 ENCOUNTER — Other Ambulatory Visit: Payer: Self-pay

## 2022-10-08 ENCOUNTER — Encounter: Payer: Self-pay | Admitting: Internal Medicine

## 2022-10-08 VITALS — BP 121/72 | HR 80 | Temp 97.5°F | Ht 73.0 in | Wt 178.4 lb

## 2022-10-08 DIAGNOSIS — K219 Gastro-esophageal reflux disease without esophagitis: Secondary | ICD-10-CM

## 2022-10-08 DIAGNOSIS — I129 Hypertensive chronic kidney disease with stage 1 through stage 4 chronic kidney disease, or unspecified chronic kidney disease: Secondary | ICD-10-CM

## 2022-10-08 DIAGNOSIS — D369 Benign neoplasm, unspecified site: Secondary | ICD-10-CM

## 2022-10-08 DIAGNOSIS — Z131 Encounter for screening for diabetes mellitus: Secondary | ICD-10-CM

## 2022-10-08 DIAGNOSIS — N1831 Chronic kidney disease, stage 3a: Secondary | ICD-10-CM | POA: Diagnosis not present

## 2022-10-08 DIAGNOSIS — R202 Paresthesia of skin: Secondary | ICD-10-CM

## 2022-10-08 DIAGNOSIS — E785 Hyperlipidemia, unspecified: Secondary | ICD-10-CM | POA: Diagnosis not present

## 2022-10-08 DIAGNOSIS — F17200 Nicotine dependence, unspecified, uncomplicated: Secondary | ICD-10-CM

## 2022-10-08 DIAGNOSIS — R7303 Prediabetes: Secondary | ICD-10-CM | POA: Insufficient documentation

## 2022-10-08 DIAGNOSIS — B91 Sequelae of poliomyelitis: Secondary | ICD-10-CM

## 2022-10-08 DIAGNOSIS — C220 Liver cell carcinoma: Secondary | ICD-10-CM | POA: Insufficient documentation

## 2022-10-08 DIAGNOSIS — Z125 Encounter for screening for malignant neoplasm of prostate: Secondary | ICD-10-CM

## 2022-10-08 DIAGNOSIS — M48061 Spinal stenosis, lumbar region without neurogenic claudication: Secondary | ICD-10-CM

## 2022-10-08 DIAGNOSIS — I10 Essential (primary) hypertension: Secondary | ICD-10-CM

## 2022-10-08 DIAGNOSIS — F17210 Nicotine dependence, cigarettes, uncomplicated: Secondary | ICD-10-CM

## 2022-10-08 MED ORDER — ATORVASTATIN CALCIUM 20 MG PO TABS: 20.0000 mg | ORAL_TABLET | Freq: Every day | ORAL | 3 refills | Status: DC

## 2022-10-08 MED ORDER — GABAPENTIN 300 MG PO CAPS: 300.0000 mg | ORAL_CAPSULE | Freq: Every day | ORAL | 3 refills | Status: DC

## 2022-10-08 MED ORDER — VARENICLINE TARTRATE 0.5 MG PO TABS: ORAL_TABLET | ORAL | 0 refills | Status: DC

## 2022-10-08 NOTE — Assessment & Plan Note (Signed)
[name] was interested in prostate cancer screening. We discussed next steps if PSA is elevated, including referral to urology for possible biopsy.   Plan -PSA

## 2022-10-08 NOTE — Assessment & Plan Note (Signed)
Patient is fasting today. Obtain fasting glucose for diabetes screening.

## 2022-10-08 NOTE — Assessment & Plan Note (Addendum)
Significant lumbar and cervical spine stenosis from prior imaging and evaluation. Continues to have lower extremity paresthesias and neuropathic pain. Previously declined surgical intervention in 2018 and was not interested in referral after new cervical findings in 2022. He is currently taking ibuprofen for pain but was interested in alternative options. He had been on gabapentin in the past and wanted to try this again. 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 He also wanted to return to water aerobics as he felt this significantly helped his symptoms after prior course. I have encouraged him to ask about the [place] program at a [employer] that offers aquatics classes.  Plan -Start gabapentin 300 mg nightly -Encourage water exercise at [facility]

## 2022-10-08 NOTE — Assessment & Plan Note (Signed)
Continues to smoke 1/2 ppd. He is questioning readiness for cessation and notes prior trials of NRT without success. He does not think he ever tried Chantix and would be open to this if affordable. I have sent a prescription and encouraged him to start when ready and if financially feasible.

## 2022-10-08 NOTE — Assessment & Plan Note (Signed)
Chronic and stable. Well managed with amlodipine-hctz-olmesartan 05-17-39 mg daily. Continue current regimen.  Plan -BMP today -Continue combination pill

## 2022-10-08 NOTE — Assessment & Plan Note (Signed)
Remains on atorvastatin 20 mg daily. Last lipid panel in 2023 with well managed cholesterol. Repeat panel today.  Plan -Lipid panel

## 2022-10-08 NOTE — Patient Instructions (Addendum)
It was wonderful to see you today!  We will be starting gabapentin 300 mg (one tablet) at night for leg pain.   Please choose a smoking quit date and start Chantix. If it is too expensive, please let me know.  Start including at least one fruit and/or vegetable into your diet daily. Call the [facility] to see if you can start Silver Sneakers to do water aerobics!  Please contact your pharmacy about scheduling the shingles (Shingrix) and updated COVID-19 vaccines.

## 2022-10-08 NOTE — Assessment & Plan Note (Signed)
Continues to have paresthesias and neuropathic pain in lower extremities, previously evaluated and felt secondary to lumbar stenosis > post-polio syndrome. [name] reports good functional ability and has a cane today to help with mobility. He is interested in attending water aerobics for further strengthening and mobility. I have encouraged him to discuss Silver Sneakers with his insurance and the [employer] in order to start classes.

## 2022-10-08 NOTE — Assessment & Plan Note (Signed)
CSY due 01/2023. Referral sent to GI today for colonoscopy and EGD evaluation.

## 2022-10-08 NOTE — Progress Notes (Signed)
Established Patient Office Visit  Subjective   Patient ID: Jonathan [name], male    DOB: [birth year]  Age: 67 y.o. MRN: [id number]  Chief Complaint  Patient presents with   Check-up Visit    Leg pain.    Jonathan [name] returns to clinic today to discuss chronic conditions and leg pain, and first visist with me. Please see assessment/plan in problem-based charting for further details of today's visit.    Patient Active Problem List   Diagnosis Date Noted   Prostate cancer screening 10/08/2022   Diabetes mellitus screening 10/08/2022   Stage 3a chronic kidney disease ([place]) 10/08/2022   Hyperlipidemia 10/08/2022   Muscle tension pain 05/22/2022   Gastroesophageal reflux disease 04/19/2021   Tobacco use disorder 06/23/2020   Hammer toe of left foot 11/19/2019   Tubular adenoma 01/22/2019   Spinal stenosis 12/16/2015   POST-POLIO SYNDROME 06/05/2006   Essential hypertension 06/05/2006     Objective:     BP 121/72 (BP Location: Right Arm, Patient Position: Sitting, Cuff Size: Normal)   Pulse 80   Temp (!) 97.5 F (36.4 C) (Oral)   Ht 6\' 1"  (1.854 m)   Wt 178 lb 6.4 oz (80.9 kg)   SpO2 100% Comment: RA  BMI 23.54 kg/m  BP Readings from Last 3 Encounters:  10/08/22 121/72  05/21/22 118/80  12/14/21 127/90   Wt Readings from Last 3 Encounters:  10/08/22 178 lb 6.4 oz (80.9 kg)  05/21/22 181 lb 3.2 oz (82.2 kg)  12/14/21 189 lb 3.2 oz (85.8 kg)      Physical Exam Vitals reviewed.  Constitutional:      General: He is not in acute distress.    Appearance: Normal appearance. He is normal weight.  Cardiovascular:     Rate and Rhythm: Normal rate and regular rhythm.  Pulmonary:     Effort: Pulmonary effort is normal.  Neurological:     General: No focal deficit present.     Mental Status: He is alert.  Psychiatric:        Mood and Affect: Mood normal.        Behavior: Behavior normal.      Assessment & Plan:   Problem List Items Addressed This Visit        Cardiovascular and Mediastinum   Essential hypertension - Primary (Chronic)    Chronic and stable. Well managed with amlodipine-hctz-olmesartan 05-17-39 mg daily. Continue current regimen.  Plan -BMP today -Continue combination pill      Relevant Medications   atorvastatin (LIPITOR) 20 MG tablet   Other Relevant Orders   BMP8+Anion Gap     Digestive   Gastroesophageal reflux disease    Remains on pantoprazole 40 mg daily for GERD. Continues to have intermittent worsening of symptoms requiring two tablets/day. Referral to GI for consideration of upper endoscopy.  Plan -Continue PPI -Ambulatory referral to GI        Nervous and Auditory   POST-POLIO SYNDROME (Chronic)    Continues to have paresthesias and neuropathic pain in lower extremities, previously evaluated and felt secondary to lumbar stenosis > post-polio syndrome. Jonathan [name] reports good functional ability and has a cane today to help with mobility. He is interested in attending water aerobics for further strengthening and mobility. I have encouraged him to discuss [employer] with his insurance and the [employer] in order to start classes.      Relevant Medications   gabapentin (NEURONTIN) 300 MG capsule     Genitourinary  Stage 3a chronic kidney disease (HCC)    GFR within CKD3a range as far back as 2017. Will update BMP today and obtain uACR. Remains on ARB, hypertension well controlled. Further discussion of pharmacologic therapy pending results.  Plan -BMP, uACR -Continue olmesartan      Relevant Orders   Microalbumin / Creatinine Urine Ratio     Other   Spinal stenosis (Chronic)    Significant lumbar and cervical spine stenosis from prior imaging and evaluation. Continues to have lower extremity paresthesias and neuropathic pain. Previously declined surgical intervention in 2018 and was not interested in referral after new cervical findings in 2022. He is currently taking ibuprofen for pain but was  interested in alternative options. He had been on gabapentin in the past and wanted to try this again. 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 He also wanted to return to water aerobics as he felt this significantly helped his symptoms after prior course. I have encouraged him to ask about the [place] program at a [employer] that offers aquatics classes.  Plan -Start gabapentin 300 mg nightly -Encourage water exercise at [facility]      Tubular adenoma (Chronic)    CSY due 01/2023. Referral sent to GI today for colonoscopy and EGD evaluation.      Relevant Orders   Ambulatory referral to Gastroenterology   Tobacco use disorder (Chronic)    Continues to smoke 1/2 ppd. He is questioning readiness for cessation and notes prior trials of NRT without success. He does not think he ever tried Chantix and would be open to this if affordable. I have sent a prescription and encouraged him to start when ready and if financially feasible.       Relevant Medications   varenicline (CHANTIX) 0.5 MG tablet   Prostate cancer screening    Jonathan [name] was interested in prostate cancer screening. We discussed next steps if PSA is elevated, including referral to urology for possible biopsy.   Plan -PSA      Relevant Orders   PSA   Diabetes mellitus screening    Patient is fasting today. Obtain fasting glucose for diabetes screening.      Hyperlipidemia    Remains on atorvastatin 20 mg daily. Last lipid panel in 2023 with well managed cholesterol. Repeat panel today.  Plan -Lipid panel       Relevant Medications   atorvastatin (LIPITOR) 20 MG tablet   Other Relevant Orders   Lipid Profile   Other Visit Diagnoses     Paresthesias       Relevant Medications   gabapentin (NEURONTIN) 300 MG capsule       Return in about 6 months (around 04/10/2023) for fu.    Charise Killian, MD

## 2022-10-08 NOTE — Assessment & Plan Note (Signed)
Remains on pantoprazole 40 mg daily for GERD. Continues to have intermittent worsening of symptoms requiring two tablets/day. Referral to GI for consideration of upper endoscopy.  Plan -Continue PPI -Ambulatory referral to GI

## 2022-10-08 NOTE — Assessment & Plan Note (Addendum)
GFR within CKD3a range as far back as 2017. Will update BMP today and obtain uACR. Remains on ARB, hypertension well controlled. Further discussion of pharmacologic therapy pending results.  Plan -BMP, uACR -Continue olmesartan

## 2022-10-09 LAB — MICROALBUMIN / CREATININE URINE RATIO
Creatinine, Urine: 111.7 mg/dL
Microalb/Creat Ratio: <3 mg/g creat (ref 0–29)
Microalbumin, Urine: <3 ug/mL

## 2022-10-10 LAB — LIPID PANEL
Chol/HDL Ratio: 2.2 ratio (ref 0.0–5.0)
Cholesterol, Total: 119 mg/dL (ref 100–199)
HDL: 53 mg/dL (ref >39)
LDL Chol Calc (NIH): 43 mg/dL (ref 0–99)
Triglycerides: 130 mg/dL (ref 0–149)
VLDL Cholesterol Cal: 23 mg/dL (ref 5–40)

## 2022-10-10 LAB — BMP8+ANION GAP
Anion Gap: 17 mmol/L (ref 10.0–18.0)
BUN/Creatinine Ratio: 15 (ref 10–24)
BUN: 21 mg/dL (ref 8–27)
CO2: 21 mmol/L (ref 20–29)
Calcium: 10 mg/dL (ref 8.6–10.2)
Chloride: 107 mmol/L — ABNORMAL HIGH (ref 96–106)
Creatinine, Ser: 1.37 mg/dL — ABNORMAL HIGH (ref 0.76–1.27)
Glucose: 100 mg/dL — ABNORMAL HIGH (ref 70–99)
Potassium: 3.7 mmol/L (ref 3.5–5.2)
Sodium: 145 mmol/L — ABNORMAL HIGH (ref 134–144)
eGFR: 58 mL/min/{1.73_m2} — ABNORMAL LOW (ref >59)

## 2022-10-10 LAB — PSA: Prostate Specific Ag, Serum: 2.7 ng/mL (ref 0.0–4.0)

## 2022-10-11 ENCOUNTER — Telehealth: Payer: Self-pay

## 2022-10-11 NOTE — Telephone Encounter (Signed)
Returned patient's call and reviewed results.

## 2022-10-11 NOTE — Progress Notes (Signed)
Reviewed results with Mr. Goldenstein.

## 2022-10-11 NOTE — Progress Notes (Signed)
Stable kidney function and mild hypernatremia. No albuminuria. Unable to reach Jonathan [name] to discuss 10/11/22.

## 2022-10-11 NOTE — Progress Notes (Signed)
Cholesterol within goal on moderate-intensity statin. Continue. Unable to reach Jonathan [name] 10/11/22.

## 2022-10-11 NOTE — Progress Notes (Signed)
Screening PSA <3. Repeat in one year. Unable to reach Jonathan [name] 10/11/22.

## 2022-10-11 NOTE — Telephone Encounter (Signed)
Requesting lab results, please call pt back.  

## 2022-12-19 ENCOUNTER — Other Ambulatory Visit: Payer: Self-pay | Admitting: Internal Medicine

## 2022-12-31 ENCOUNTER — Encounter: Payer: Medicare Other | Admitting: Internal Medicine

## 2023-01-08 IMAGING — MR MR CERVICAL SPINE W/O CM
6 of 9 series · 26 of 48 positions shown · non-contrast
Comparison: None.

CLINICAL DATA: Cervical radiculopathy with numbness and tingling.

EXAM:
MRI CERVICAL SPINE WITHOUT CONTRAST
TECHNIQUE: Multiplanar, multisequence MR imaging of the cervical spine was
performed. No intravenous contrast was administered.

[Series 5: T1 · sagittal · 3.0mm · 0.69mm/px · 3 of 17 slices shown (1 of 2)]
[im 1/17]
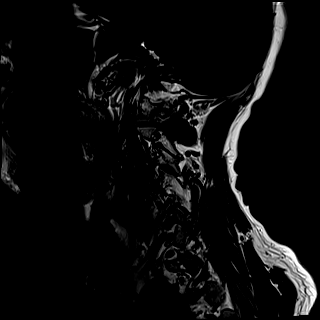
[im 9/17]
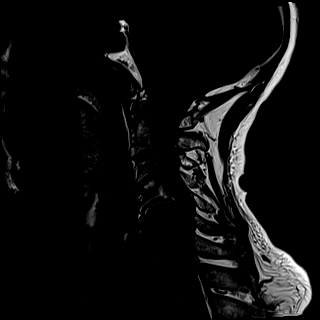
[im 17/17]
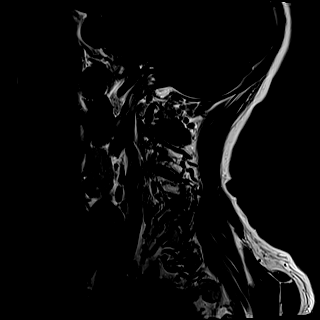

[Series 6: T2 · sagittal · 3.0mm · 0.69mm/px · 3 of 17 slices shown (1 of 2)]
[im 1/17]
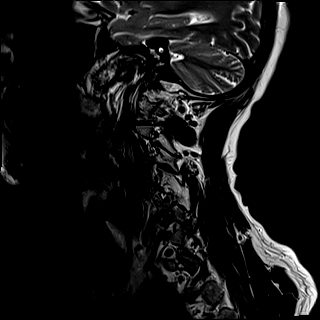
[im 9/17]
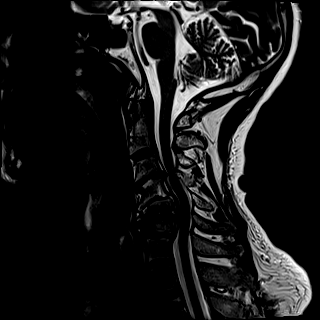
[im 17/17]
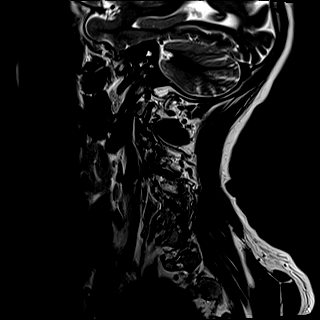

[Series 7: STIR · sagittal · 3.0mm · 0.86mm/px · 3 of 17 slices shown]
[im 1/17]
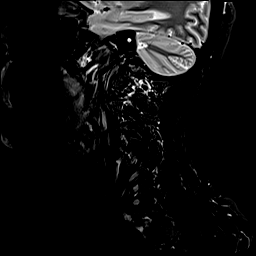
[im 9/17]
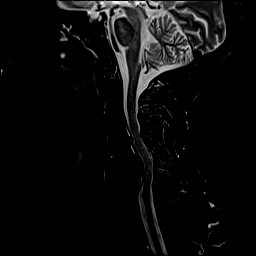
[im 17/17]
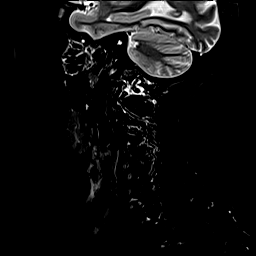

[Series 8: T2 · axial · 3.0mm · 0.70mm/px · z∈[-96,+22]mm · 6 of 36 slices shown (2 of 2)]
[im 1/36]
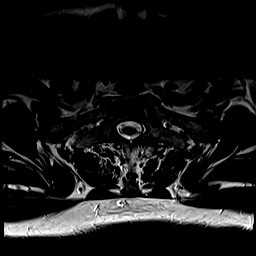
[im 8/36]
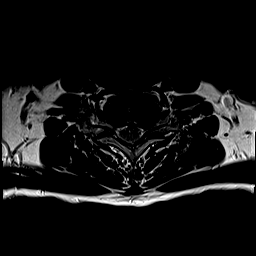
[im 15/36]
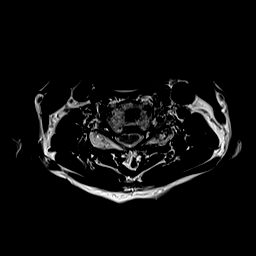
[im 22/36]
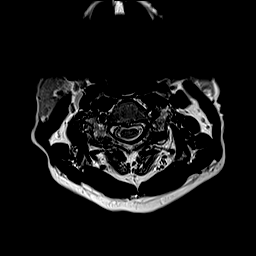
[im 29/36]
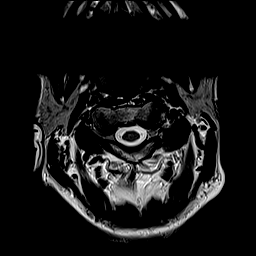
[im 36/36]
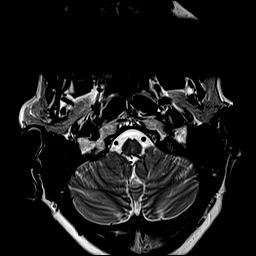

[Series 9: T1 · axial · 3.0mm · 0.35mm/px · z∈[-96,+22]mm · 6 of 36 slices shown (2 of 2)]
[im 1/36]
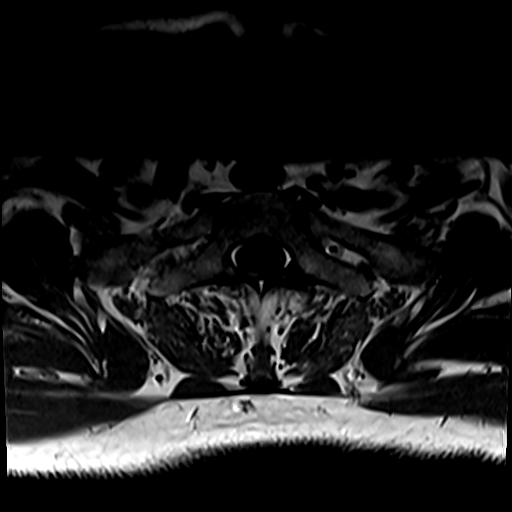
[im 8/36]
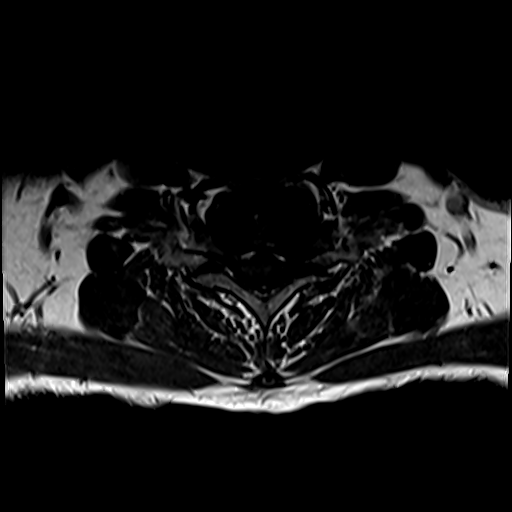
[im 15/36]
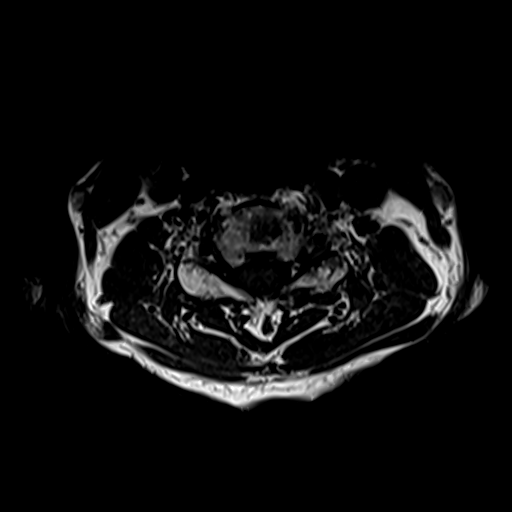
[im 22/36]
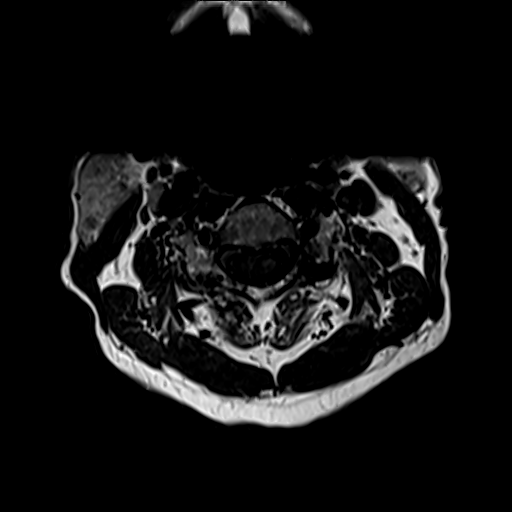
[im 29/36]
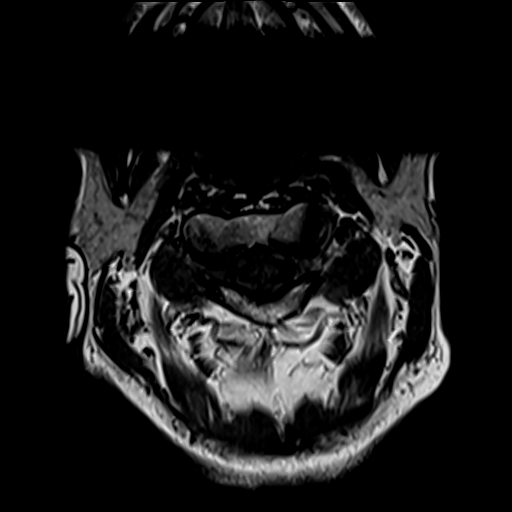
[im 36/36]
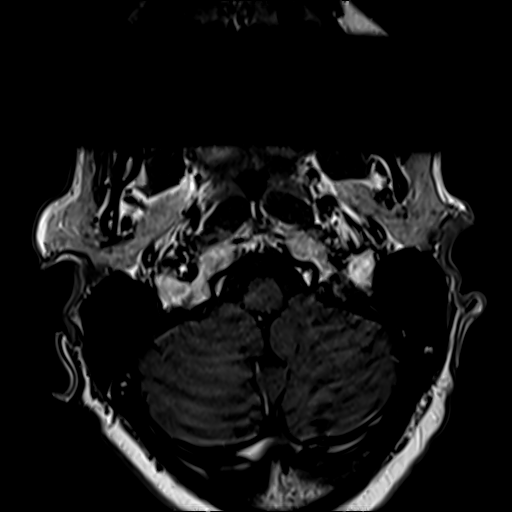

[Series 10: GRE · axial · 3.0mm · 0.35mm/px · z∈[-96,-5]mm · 5 of 28 slices shown]
[im 1/28]
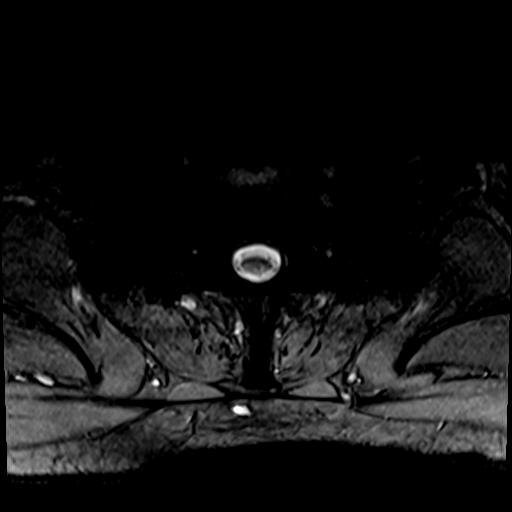
[im 7/28]
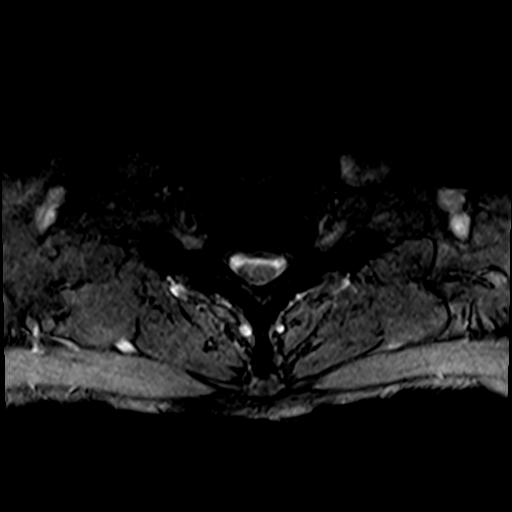
[im 14/28]
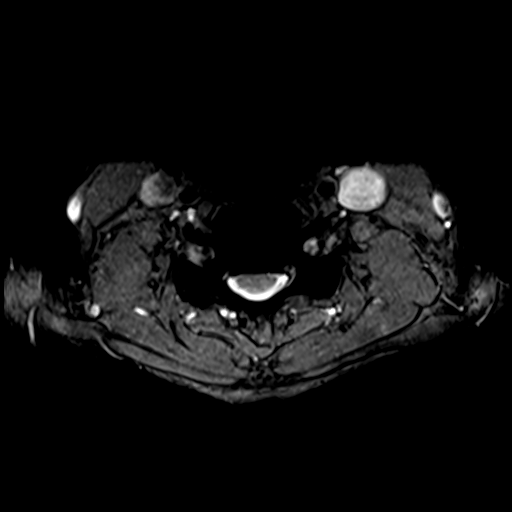
[im 21/28]
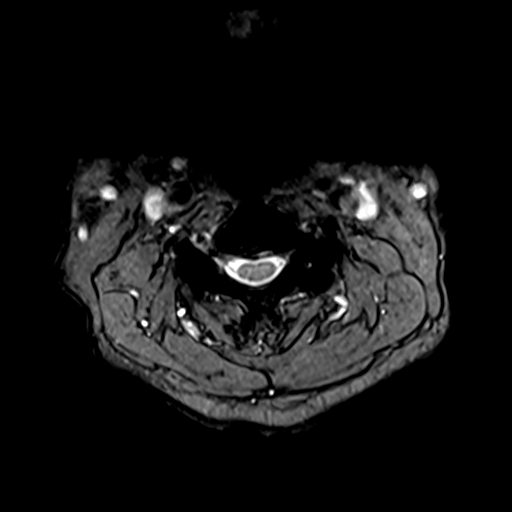
[im 28/28]
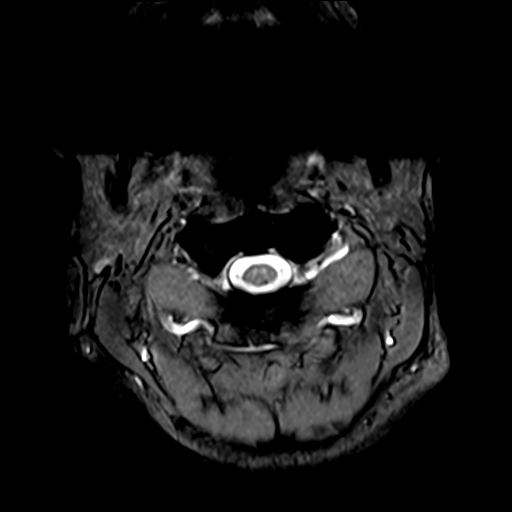

[26 of 48 positions shown; findings below may reference images not displayed]

FINDINGS: Alignment: Degenerative reversal of cervical lordosis with
degenerative anterolisthesis at C4-5 that measures 5 mm. Mild C2-3
anterolisthesis and C5-6 retrolisthesis.

Vertebrae: No fracture, evidence of discitis, or bone lesion.

Cord: Normal signal and morphology.

Posterior Fossa, vertebral arteries, paraspinal tissues: Negative.

Disc levels:

C2-3: Facet osteoarthritis with moderate spurring and slight
anterolisthesis. The disc is bulging with uncovertebral spurring
asymmetric to the right. Mild right foraminal narrowing.

C3-4: Disc narrowing and bulging with endplate and uncovertebral
ridging. Superimposed central protrusion mildly flattening the cord.
Uncovertebral spurring on the left more than right. Severe left
foraminal impingement. Mild or moderate right foraminal narrowing

C4-5: Disc collapse and facet degeneration with spurring. There is
fused anterolisthesis with moderate right foraminal stenosis and
mild spinal stenosis.

C5-6: Disc collapse and endplate degeneration with circumferential
disc bulging and ridging. Degenerative facet spurring. Spinal
stenosis with mild cord flattening. Severe biforaminal impingement.

C6-7: Disc narrowing and bulging with endplate and uncovertebral
ridging asymmetric to the left. Advanced left more than right
foraminal impingement. Mild spinal stenosis.

C7-T1:Unremarkable.
IMPRESSION: 1. Generalized degenerative disease with reversal of cervical
lordosis and multilevel listhesis.
2. Mild spinal stenosis at C4-5 and C5-6.
3. Right foraminal impingement at C4-5 to C6-7.
4. Left foraminal impingement at C3-4, C5-6, and C6-7.

## 2023-01-08 IMAGING — MR MR HEAD WO/W CM
16 of 20 series · 41 of 48 positions shown · IV contrast (gadavist)
Comparison: Head CT 07/21/2008

CLINICAL DATA: Bilateral hand numbness and tingling, evaluate for
demyelinating disease

EXAM:
MRI HEAD WITHOUT AND WITH CONTRAST
TECHNIQUE: Multiplanar, multiecho pulse sequences of the brain and surrounding
structures were obtained without and with intravenous contrast.
CONTRAST:  8.5mL GADAVIST GADOBUTROL 1 MMOL/ML IV SOLN

[Series 9: DWI · axial · 3.0mm · 1.36mm/px · z∈[+83,+205]mm · 3 of 84 slices shown (1 of 4)]
[im 1/84]
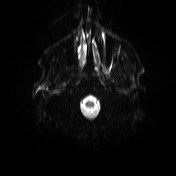
[im 42/84]
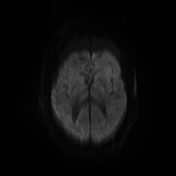
[im 84/84]
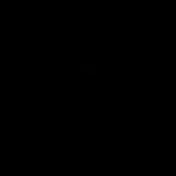

[Series 10: DWI · axial · 3.0mm · 1.36mm/px · z∈[+83,+196]mm · 2 of 39 slices shown (2 of 4)]
[im 1/39]
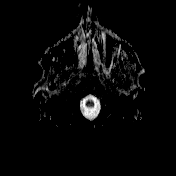
[im 39/39]
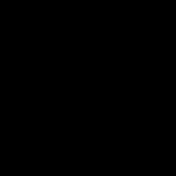

[Series 11: T1 · sagittal · 5.0mm · 0.75mm/px · 1 of 24 slices shown (1 of 2)]
[im 1/24]
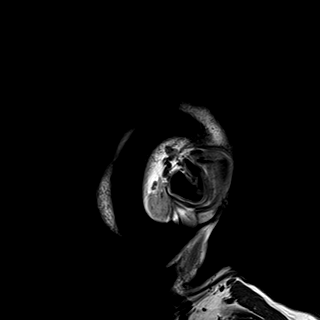

[Series 12: FLAIR · sagittal · 1.1mm · 0.50mm/px · 5 of 128 slices shown (1 of 2)]
[im 1/128]
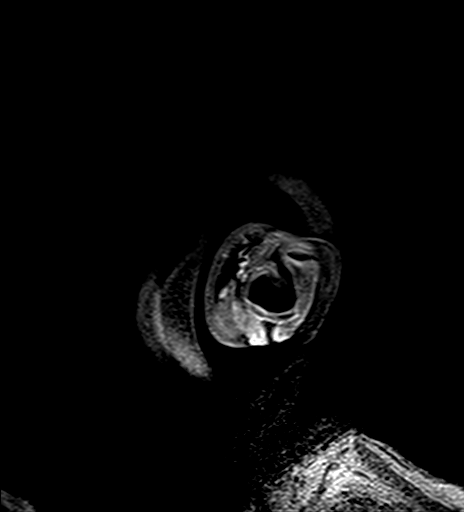
[im 32/128]
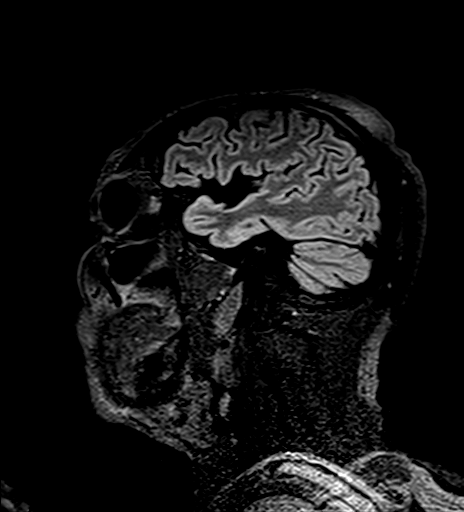
[im 64/128]
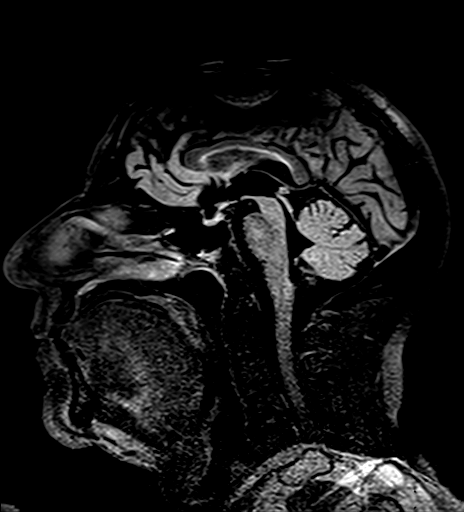
[im 96/128]
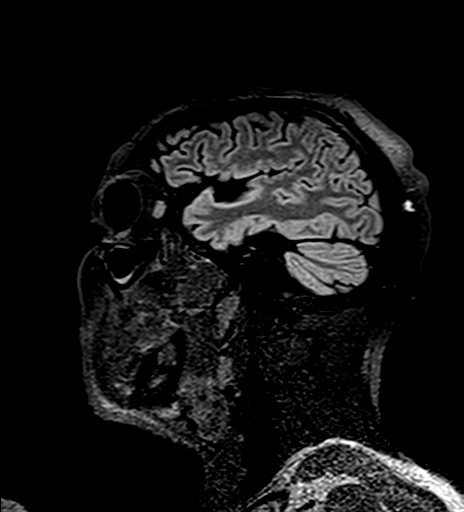
[im 128/128]
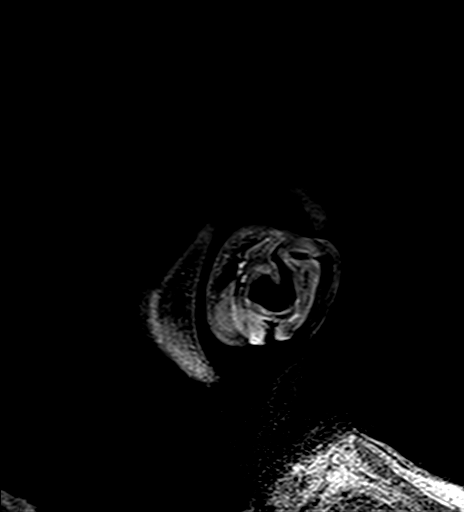

[Series 13: T2 · sagittal · 1.5mm · 1.46mm/px · 4 of 96 slices shown (1 of 2)]
[im 1/96]
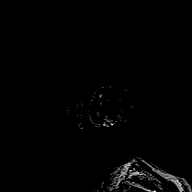
[im 32/96]
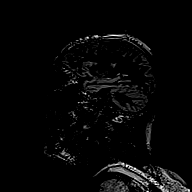
[im 64/96]
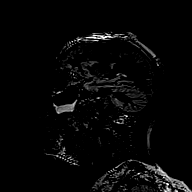
[im 96/96]
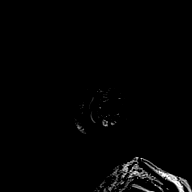

[Series 14: T2 · axial · 5.0mm · 0.62mm/px · 1 of 23 slices shown (2 of 2)]
[im 1/23]
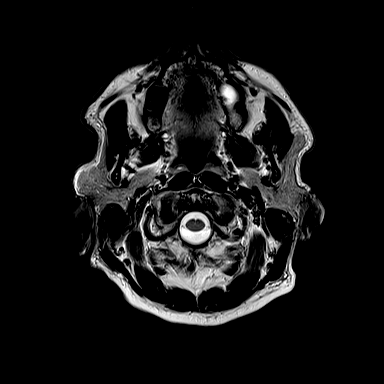

[Series 15: swi_images · axial · 3.0mm · 0.75mm/px · z∈[+64,+227]mm · 2 of 56 slices shown]
[im 1/56]
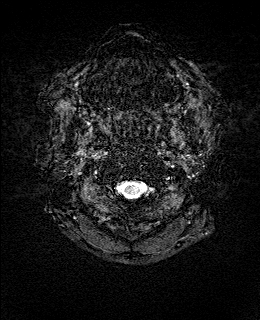
[im 56/56]
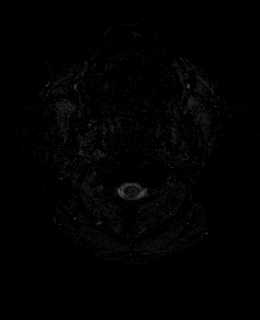

[Series 16: mip_images(sw) · axial · 24.0mm · 0.75mm/px · z∈[+74,+217]mm · 2 of 49 slices shown]
[im 1/49]
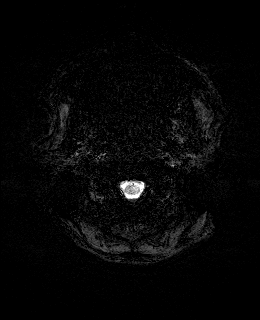
[im 49/49]
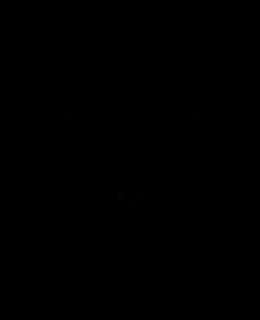

[Series 17: FLAIR · axial · 3.0mm · 0.75mm/px · z∈[+80,+201]mm · 2 of 42 slices shown (2 of 2)]
[im 1/42]
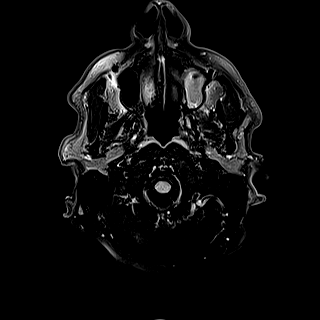
[im 42/42]
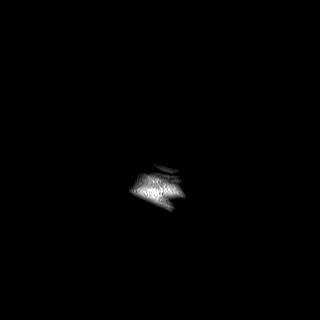

[Series 18: T1 · axial · 1.0mm · 0.94mm/px · z∈[+75,+216]mm · 6 of 144 slices shown (2 of 2)]
[im 1/144]
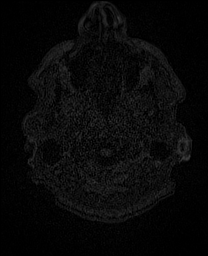
[im 29/144]
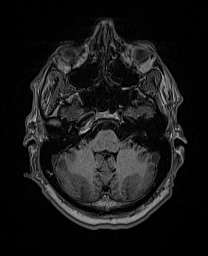
[im 58/144]
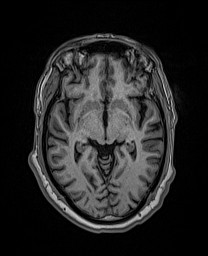
[im 86/144]
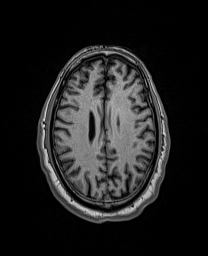
[im 115/144]
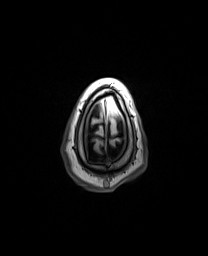
[im 144/144]
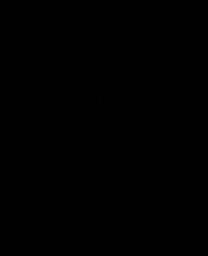

[Series 19: DWI · coronal · 5.0mm · 1.31mm/px · 3 of 64 slices shown (3 of 4)]
[im 1/64]
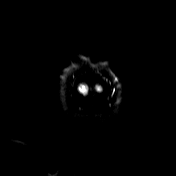
[im 32/64]
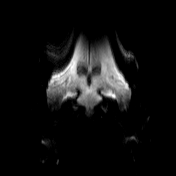
[im 64/64]
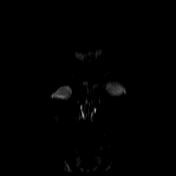

[Series 20: DWI · coronal · 5.0mm · 1.31mm/px · 1 of 32 slices shown (4 of 4)]
[im 1/32]
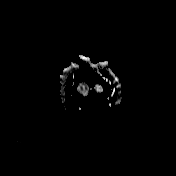

[Series 21: T2 post-contrast · coronal · 5.0mm · 0.57mm/px · 1 of 26 slices shown]
[im 1/26]
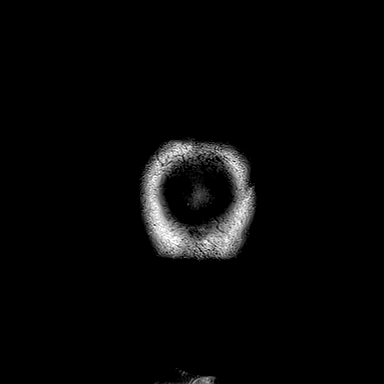

[Series 22: T1 post-contrast · axial · 1.0mm · 0.94mm/px · z∈[+73,+215]mm · 6 of 144 slices shown (1 of 3)]
[im 1/144]
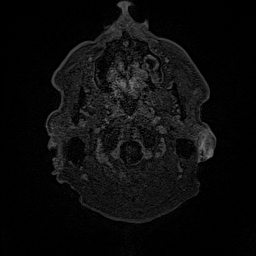
[im 29/144]
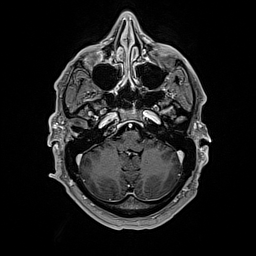
[im 58/144]
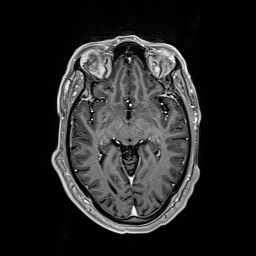
[im 86/144]
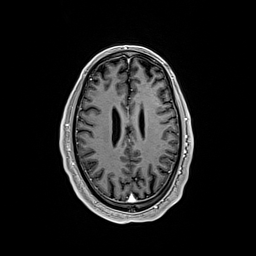
[im 115/144]
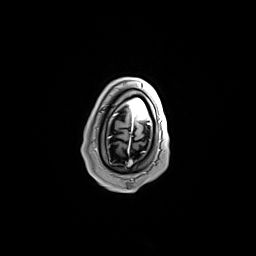
[im 144/144]
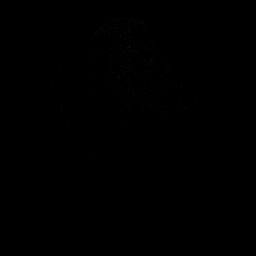

[Series 23: T1 post-contrast · coronal · 5.0mm · 0.43mm/px · 1 of 28 slices shown (2 of 3)]
[im 1/28]
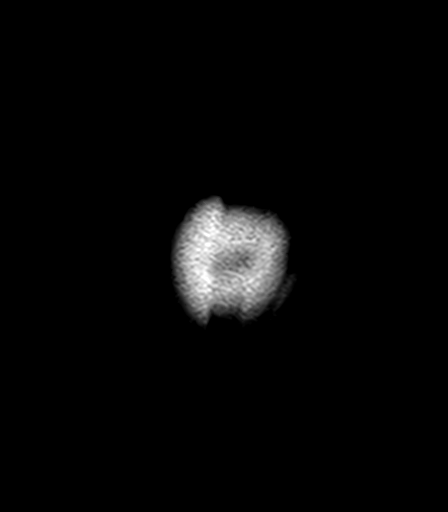

[Series 24: T1 post-contrast · sagittal · 5.0mm · 0.75mm/px · 1 of 24 slices shown (3 of 3)]
[im 1/24]
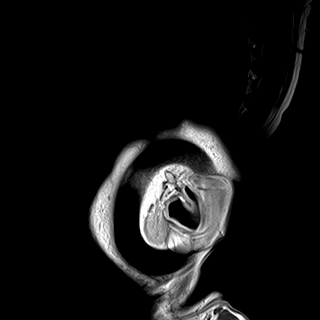

[41 of 48 positions shown; findings below may reference images not displayed]

FINDINGS: Brain: No white matter disease, infarction, hemorrhage,
hydrocephalus, extra-axial collection or mass lesion. No abnormal
enhancement.

Vascular: Preserved flow voids and vascular enhancements

Skull and upper cervical spine: Normal marrow signal

Sinuses/Orbits: Negative.
IMPRESSION: No white matter disease or other explanation for symptoms.

## 2023-01-23 ENCOUNTER — Encounter: Payer: Medicare Other | Admitting: Internal Medicine

## 2023-02-04 ENCOUNTER — Ambulatory Visit (INDEPENDENT_AMBULATORY_CARE_PROVIDER_SITE_OTHER): Payer: Medicare Other | Admitting: Internal Medicine

## 2023-02-04 ENCOUNTER — Encounter: Payer: Self-pay | Admitting: Internal Medicine

## 2023-02-04 ENCOUNTER — Ambulatory Visit (INDEPENDENT_AMBULATORY_CARE_PROVIDER_SITE_OTHER): Payer: Medicare Other

## 2023-02-04 VITALS — BP 131/88 | HR 52 | Temp 97.6°F | Ht 73.0 in | Wt 175.2 lb

## 2023-02-04 DIAGNOSIS — D369 Benign neoplasm, unspecified site: Secondary | ICD-10-CM | POA: Diagnosis not present

## 2023-02-04 DIAGNOSIS — Z0000 Encounter for general adult medical examination without abnormal findings: Secondary | ICD-10-CM | POA: Diagnosis not present

## 2023-02-04 DIAGNOSIS — I129 Hypertensive chronic kidney disease with stage 1 through stage 4 chronic kidney disease, or unspecified chronic kidney disease: Secondary | ICD-10-CM | POA: Diagnosis not present

## 2023-02-04 DIAGNOSIS — H538 Other visual disturbances: Secondary | ICD-10-CM | POA: Diagnosis not present

## 2023-02-04 DIAGNOSIS — I10 Essential (primary) hypertension: Secondary | ICD-10-CM

## 2023-02-04 DIAGNOSIS — F17210 Nicotine dependence, cigarettes, uncomplicated: Secondary | ICD-10-CM | POA: Diagnosis not present

## 2023-02-04 DIAGNOSIS — N1831 Chronic kidney disease, stage 3a: Secondary | ICD-10-CM | POA: Diagnosis not present

## 2023-02-04 DIAGNOSIS — F17200 Nicotine dependence, unspecified, uncomplicated: Secondary | ICD-10-CM

## 2023-02-04 NOTE — Progress Notes (Signed)
Subjective:  CC: kidney problem  HPI:  [name] is a 67 y.o. male with a past medical history stated below and presents today to talk about abnormal kidney findings from last visit On chart review, labs were repeated to follow-up on stable chronic kidney disease. We reviewed diagnosis and how to prevent kidney injury  Please see problem based assessment and plan for additional details.  Past Medical History:  Diagnosis Date   Allergy    Anxiety    Blood transfusion without reported diagnosis    as baby   Chronic Bil Foot Pain 05/27/2011   Lt > Rt - since age 70 2/2 to post polio syndrome  Corns and callosities on both feet  Reconstruction surgery at age 39. Uses a brace on left leg for many years On prn tylenol for pain  No candidate for opiates due to active substance abuse Podiatry and PT (for brace eval) on 04/23/2013     Chronic Bil Foot Pain 05/27/2011   Lt > Rt - since age 80 2/2 to post polio syndrome  Corns and callosities on both feet  Reconstruction surgery at age 61. Uses a brace on left leg for many years On prn tylenol for pain  No candidate for opiates due to active substance abuse Podiatry and PT (for brace eval) on 04/23/2013    Chronic viral hepatitis C (HCC) 06/05/2006   History of blood transfusion in childhood Dx 2010 Reports history of Rx while in [place], unknown yr Elevated LFTs since 2010 and repeat 2014 U/S 2009 - no cirrhosis, repeat in 12/2013 >> normal.  Fibrosure >> pending results  Referred to Hep C clinic 04/23/2013 >> appt in 06/2013 Hep C clinic [phone] Immunity to hep A per labs at Hep C clinic  Started on Hep B series 1 st dose 02/03/2014, 2 dose given 04/23/2014. Last dose about 09/2014.  Follow with Hep C clinic        ERECTILE DYSFUNCTION 06/05/2006   Qualifier: Diagnosis of  By: Janee Morn MD, Daniel     Essential hypertension 06/05/2006   Dx in 2008  Has BP machine at home  Checks erratically  Chronic no show issues       GERD  (gastroesophageal reflux disease)    occ s/s   Hypertension    Polysubstance abuse (HCC) 04/23/2013   Cocaine (sniffs powder) and marijuana No IVDU    POST-POLIO SYNDROME 06/05/2006   Hx of polio at age 43 Has spasms in his legs on and off Used Flexeril on and off for spasms 2015 May >> sent to rehab/PT. rec new leg braces/ortho     Preventative health care 05/27/2011   Colonoscopy July 2019 polyps --> rec'd f/u in 5 years    Current Outpatient Medications on File Prior to Visit  Medication Sig Dispense Refill   atorvastatin (LIPITOR) 20 MG tablet Take 1 tablet (20 mg total) by mouth daily. 90 tablet 3   gabapentin (NEURONTIN) 300 MG capsule Take 1 capsule (300 mg total) by mouth at bedtime. 90 capsule 3   Olmesartan-amLODIPine-HCTZ 40-10-25 MG TABS Take 1 tablet by mouth once daily 90 tablet 3   pantoprazole (PROTONIX) 40 MG tablet Take 1 tablet by mouth once daily 90 tablet 0   No current facility-administered medications on file prior to visit.    Family History  Problem Relation Age of Onset   Cancer Mother    Breast cancer Mother    Colon cancer Neg Hx  Colon polyps Neg Hx     Social History   Socioeconomic History   Marital status: Single    Spouse name: Not on file   Number of children: 4   Years of education: Not on file   Highest education level: Not on file  Occupational History   Not on file  Tobacco Use   Smoking status: Every Day    Current packs/day: 0.50    Average packs/day: 0.5 packs/day for 35.0 years (17.5 ttl pk-yrs)    Types: Cigarettes   Smokeless tobacco: Never   Tobacco comments:    .5 PPD  Substance and Sexual Activity   Alcohol use: Yes    Alcohol/week: 21.0 standard drinks of alcohol    Types: 21 Standard drinks or equivalent per week    Comment: beer everyday   Drug use: Yes    Types: Marijuana    Comment: marijuana   Sexual activity: Not on file  Other Topics Concern   Not on file  Social History Narrative   Right Handed     Lives in a one story home       Does not know his father   Has 2 daughters and 2 sons.   Social Determinants of Health   Financial Resource Strain: Low Risk  (02/04/2023)   Overall Financial Resource Strain (CARDIA)    Difficulty of Paying Living Expenses: Not hard at all  Food Insecurity: Food Insecurity Present (02/04/2023)   Hunger Vital Sign    Worried About Running Out of Food in the Last Year: Sometimes true    Ran Out of Food in the Last Year: Sometimes true  Transportation Needs: No Transportation Needs (02/04/2023)   PRAPARE - Administrator, Civil Service (Medical): No    Lack of Transportation (Non-Medical): No  Physical Activity: Inactive (02/04/2023)   Exercise Vital Sign    Days of Exercise per Week: 0 days    Minutes of Exercise per Session: 0 min  Stress: No Stress Concern Present (02/04/2023)   [employer] of Occupational Health - Occupational Stress Questionnaire    Feeling of Stress : Not at all  Social Connections: Socially Isolated (02/04/2023)   Social Connection and Isolation Panel [NHANES]    Frequency of Communication with Friends and Family: More than three times a week    Frequency of Social Gatherings with Friends and Family: Not on file    Attends Religious Services: Never    Database administrator or Organizations: No    Attends Banker Meetings: Never    Marital Status: Divorced  Catering manager Violence: Not At Risk (02/04/2023)   Humiliation, Afraid, Rape, and Kick questionnaire    Fear of Current or Ex-Partner: No    Emotionally Abused: No    Physically Abused: No    Sexually Abused: No    Review of Systems: ROS negative except for what is noted on the assessment and plan.  Objective:   Vitals:   02/04/23 0909 02/04/23 0933  BP: (!) 134/96 131/88  Pulse: (!) 59 (!) 52  Temp: 97.6 F (36.4 C)   TempSrc: Oral   SpO2: 100%   Weight: 175 lb 3.2 oz (79.5 kg)   Height: 6\' 1"  (1.854 m)     Physical  Exam: Constitutional: well-appearing  Cardiovascular: regular rate and rhythm, no m/r/g Pulmonary/Chest: normal work of breathing on room air, lungs clear to auscultation bilaterally Abdominal: soft, non-tender, non-distended MSK: normal bulk and tone Skin: warm and dry  Assessment & Plan:  Tubular adenoma He has not been to GI doctor yet. Referral information provided on AVS.  Blurry vision, bilateral He has noted blurry vision with reading to both eyes. Denies eye pain, floaters. Differentials include cataract versus presbyopia.  -referral for ophthalmology placed  Tobacco use disorder He was not able to pick up chantix due to cost (>$100). He has tried nicotine replacement in the past and did not find this helpful. He does want to quit but feels like its more about will power than taking medications. He is not interested in CBT at this time.  I talked with him about calling 1-800-QUITNOW to see if additional resources were available. He currently smoked about 5-6 cigarettes daily.   Stage 3a chronic kidney disease (HCC) He was concerned after phone call with results from march and he thought he was told there was a spot on his kidney. I reviewed diagnosis of CKD with him. He is adherent with blood pressure medications. -repeat BMP in 3 months for 6 month follow-up  Essential hypertension BP Readings from Last 3 Encounters:  02/04/23 131/88  10/08/22 121/72  05/21/22 118/80   BP well controlled with combination pill, olmesartan-amlodipine-hydrochlorothiazide 40-10-25 mg. He does note increased urination but he does not find this bothersome enough to change medications.    Patient discussed with Dr. Laney Pastor Yovan Leeman, D.O. [facility]  PGY-3 Pager: [phone]  Phone: [phone] Date 02/04/2023  Time 10:24 AM

## 2023-02-04 NOTE — Assessment & Plan Note (Signed)
He has noted blurry vision with reading to both eyes. Denies eye pain, floaters. Differentials include cataract versus presbyopia.  -referral for ophthalmology placed

## 2023-02-04 NOTE — Addendum Note (Signed)
Addended by: Lucille Passy on: 02/04/2023 11:29 AM   Modules accepted: Level of Service

## 2023-02-04 NOTE — Assessment & Plan Note (Signed)
BP Readings from Last 3 Encounters:  02/04/23 131/88  10/08/22 121/72  05/21/22 118/80   BP well controlled with combination pill, olmesartan-amlodipine-hydrochlorothiazide 40-10-25 mg. He does note increased urination but he does not find this bothersome enough to change medications.

## 2023-02-04 NOTE — Assessment & Plan Note (Signed)
He has not been to GI doctor yet. Referral information provided on AVS.

## 2023-02-04 NOTE — Assessment & Plan Note (Signed)
He was not able to pick up chantix due to cost (>$100). He has tried nicotine replacement in the past and did not find this helpful. He does want to quit but feels like its more about will power than taking medications. He is not interested in CBT at this time.  I talked with him about calling 1-800-QUITNOW to see if additional resources were available. He currently smoked about 5-6 cigarettes daily.

## 2023-02-04 NOTE — Assessment & Plan Note (Signed)
He was concerned after phone call with results from march and he thought he was told there was a spot on his kidney. I reviewed diagnosis of CKD with him. He is adherent with blood pressure medications. -repeat BMP in 3 months for 6 month follow-up

## 2023-02-04 NOTE — Progress Notes (Signed)
Subjective:   Jonathan [name] is a 67 y.o. male who presents for an Initial Medicare Annual Wellness Visit.  Visit Complete: In person  Patient Medicare AWV questionnaire was completed by the patient on 02/04/2023; I have confirmed that all information answered by patient is correct and no changes since this date.  Review of Systems    Defer to PCP       Objective:    Today's Vitals   02/04/23 1121 02/04/23 1122  BP: (!) 134/96 131/88  Pulse: (!) 59 (!) 52  Temp: 97.6 F (36.4 C)   TempSrc: Oral   SpO2: 100%   Weight: 175 lb 3.2 oz (79.5 kg)   Height: 6\' 1"  (1.854 m)   PainSc:  8    Body mass index is 23.11 kg/m.     02/04/2023   11:23 AM 02/04/2023    9:11 AM 10/08/2022    8:26 AM 05/21/2022    1:57 PM 12/14/2021    9:06 AM 08/22/2021    8:58 AM 07/03/2021    8:12 AM  Advanced Directives  Does Patient Have a Medical Advance Directive? No No No No No No No  Would patient like information on creating a medical advance directive? No - Patient declined No - Patient declined Yes (MAU/Ambulatory/Procedural Areas - Information given) No - Patient declined No - Patient declined No - Patient declined     Current Medications (verified) Outpatient Encounter Medications as of 02/04/2023  Medication Sig   atorvastatin (LIPITOR) 20 MG tablet Take 1 tablet (20 mg total) by mouth daily.   gabapentin (NEURONTIN) 300 MG capsule Take 1 capsule (300 mg total) by mouth at bedtime.   Olmesartan-amLODIPine-HCTZ 40-10-25 MG TABS Take 1 tablet by mouth once daily   pantoprazole (PROTONIX) 40 MG tablet Take 1 tablet by mouth once daily   No facility-administered encounter medications on file as of 02/04/2023.    Allergies (verified) Patient has no known allergies.   History: Past Medical History:  Diagnosis Date   Allergy    Anxiety    Blood transfusion without reported diagnosis    as baby   Chronic Bil Foot Pain 05/27/2011   Lt > Rt - since age 58 2/2 to post polio syndrome   Corns and callosities on both feet  Reconstruction surgery at age 60. Uses a brace on left leg for many years On prn tylenol for pain  No candidate for opiates due to active substance abuse Podiatry and PT (for brace eval) on 04/23/2013     Chronic Bil Foot Pain 05/27/2011   Lt > Rt - since age 47 2/2 to post polio syndrome  Corns and callosities on both feet  Reconstruction surgery at age 62. Uses a brace on left leg for many years On prn tylenol for pain  No candidate for opiates due to active substance abuse Podiatry and PT (for brace eval) on 04/23/2013    Chronic viral hepatitis C (HCC) 06/05/2006   History of blood transfusion in childhood Dx 2010 Reports history of Rx while in [place], unknown yr Elevated LFTs since 2010 and repeat 2014 U/S 2009 - no cirrhosis, repeat in 12/2013 >> normal.  Fibrosure >> pending results  Referred to Hep C clinic 04/23/2013 >> appt in 06/2013 Hep C clinic [phone] Immunity to hep A per labs at Hep C clinic  Started on Hep B series 1 st dose 02/03/2014, 2 dose given 04/23/2014. Last dose about 09/2014.  Follow with Hep C clinic  ERECTILE DYSFUNCTION 06/05/2006   Qualifier: Diagnosis of  By: Janee Morn MD, Daniel     Essential hypertension 06/05/2006   Dx in 2008  Has BP machine at home  Checks erratically  Chronic no show issues       GERD (gastroesophageal reflux disease)    occ s/s   Hypertension    Polysubstance abuse (HCC) 04/23/2013   Cocaine (sniffs powder) and marijuana No IVDU    POST-POLIO SYNDROME 06/05/2006   Hx of polio at age 51 Has spasms in his legs on and off Used Flexeril on and off for spasms 2015 May >> sent to rehab/PT. rec new leg braces/ortho     Preventative health care 05/27/2011   Colonoscopy July 2019 polyps --> rec'd f/u in 5 years   Past Surgical History:  Procedure Laterality Date   COLONOSCOPY     FRACTURE SURGERY     KNEE ARTHROSCOPY Right    Family History  Problem Relation Age of Onset   Cancer Mother    Breast cancer  Mother    Colon cancer Neg Hx    Colon polyps Neg Hx    Social History   Socioeconomic History   Marital status: Single    Spouse name: Not on file   Number of children: 4   Years of education: Not on file   Highest education level: Not on file  Occupational History   Not on file  Tobacco Use   Smoking status: Every Day    Current packs/day: 0.50    Average packs/day: 0.5 packs/day for 35.0 years (17.5 ttl pk-yrs)    Types: Cigarettes   Smokeless tobacco: Never   Tobacco comments:    .5 PPD  Substance and Sexual Activity   Alcohol use: Yes    Alcohol/week: 21.0 standard drinks of alcohol    Types: 21 Standard drinks or equivalent per week    Comment: beer everyday   Drug use: Yes    Types: Marijuana    Comment: marijuana   Sexual activity: Not on file  Other Topics Concern   Not on file  Social History Narrative   Right Handed    Lives in a one story home       Does not know his father   Has 2 daughters and 2 sons.   Social Determinants of Health   Financial Resource Strain: Low Risk  (02/04/2023)   Overall Financial Resource Strain (CARDIA)    Difficulty of Paying Living Expenses: Not hard at all  Food Insecurity: Food Insecurity Present (02/04/2023)   Hunger Vital Sign    Worried About Running Out of Food in the Last Year: Sometimes true    Ran Out of Food in the Last Year: Sometimes true  Transportation Needs: No Transportation Needs (02/04/2023)   PRAPARE - Administrator, Civil Service (Medical): No    Lack of Transportation (Non-Medical): No  Physical Activity: Inactive (02/04/2023)   Exercise Vital Sign    Days of Exercise per Week: 0 days    Minutes of Exercise per Session: 0 min  Stress: No Stress Concern Present (02/04/2023)   [employer] of Occupational Health - Occupational Stress Questionnaire    Feeling of Stress : Not at all  Social Connections: Socially Isolated (02/04/2023)   Social Connection and Isolation Panel [NHANES]     Frequency of Communication with Friends and Family: More than three times a week    Frequency of Social Gatherings with Friends and Family: Not on file  Attends Religious Services: Never    Active Member of Clubs or Organizations: No    Attends Banker Meetings: Never    Marital Status: Divorced    Tobacco Counseling Ready to quit: Not Answered Counseling given: Not Answered Tobacco comments: .5 PPD   Clinical Intake:  Pre-visit preparation completed: Yes  Pain : 0-10 Pain Score: 8  Pain Type: Chronic pain Pain Location: Leg Pain Orientation: Right, Left Pain Descriptors / Indicators: Aching Pain Onset: More than a month ago Pain Frequency: Constant Pain Relieving Factors: rx medications  Pain Relieving Factors: rx medications  Nutritional Risks: None Diabetes: No  How often do you need to have someone help you when you read instructions, pamphlets, or other written materials from your doctor or pharmacy?: 1 - Never What is the last grade level you completed in school?: 12th grade  Interpreter Needed?: No  Information entered by :: Vernell Townley,cma   Activities of Daily Living    02/04/2023   11:22 AM 02/04/2023    9:10 AM  In your present state of health, do you have any difficulty performing the following activities:  Hearing? 0 0  Vision? 1 1  Comment  at times  Difficulty concentrating or making decisions? 1 1  Comment  at times  Walking or climbing stairs? 1 1  Comment  at times  Dressing or bathing? 0 0  Doing errands, shopping? 0 0    Patient Care Team: Dickie La, MD as PCP - General (Internal Medicine) Glendale Chard, DO as Consulting Physician (Neurology)  Indicate any recent Medical Services you may have received from other than [facility] providers in the past year (date may be approximate).     Assessment:   This is a routine wellness examination for [place].  Hearing/Vision screen No results found.  Dietary issues and  exercise activities discussed:     Goals Addressed   None   Depression Screen    02/04/2023   11:23 AM 02/04/2023    9:10 AM 10/08/2022    8:26 AM 12/27/2020    8:57 AM 11/03/2020    9:33 AM 06/22/2020    5:11 PM 01/14/2020   10:09 AM  PHQ 2/9 Scores  PHQ - 2 Score 0 0 0 0 0 0 2  PHQ- 9 Score    2 5 0 4    Fall Risk    02/04/2023   11:23 AM 02/04/2023    9:10 AM 10/08/2022    8:26 AM 05/21/2022    1:59 PM 12/14/2021    9:05 AM  Fall Risk   Falls in the past year? 0 0 0 0 0  Number falls in past yr: 0 0 0 0 0  Injury with Fall? 0 0 0 0 0  Risk for fall due to : No Fall Risks No Fall Risks Impaired balance/gait;Impaired mobility Impaired balance/gait;Impaired mobility;Other (Comment) No Fall Risks  Risk for fall due to: Comment    cane, drop foot, brace, polio syndrome   Follow up Falls evaluation completed;Falls prevention discussed Falls evaluation completed;Falls prevention discussed Falls evaluation completed;Falls prevention discussed Falls evaluation completed Falls evaluation completed    MEDICARE RISK AT HOME:  Medicare Risk at Home - 02/04/23 1123     Any stairs in or around the home? Yes    If so, are there any without handrails? No    Home free of loose throw rugs in walkways, pet beds, electrical cords, etc? Yes    Adequate lighting in your  home to reduce risk of falls? Yes    Life alert? Yes    Use of a cane, walker or w/c? Yes    Grab bars in the bathroom? Yes    Shower chair or bench in shower? Yes    Elevated toilet seat or a handicapped toilet? No             TIMED UP AND GO:  Was the test performed? No    Cognitive Function:        02/04/2023   11:23 AM  6CIT Screen  What Year? 0 points  What month? 0 points  What time? 0 points  Count back from 20 0 points  Months in reverse 0 points  Repeat phrase 0 points  Total Score 0 points    Immunizations Immunization History  Administered Date(s) Administered   Hepatitis A, Adult 04/30/2016    Hepatitis B, ADULT 02/01/2016   Hepatitis B, PED/ADOLESCENT 02/03/2014, 04/23/2014   Influenza Split 05/25/2011   Influenza Whole 06/30/2009   Influenza,inj,Quad PF,6+ Mos 04/23/2013, 04/23/2014, 04/30/2016, 04/17/2018, 06/22/2020   Moderna SARS-COV2 Booster Vaccination 08/01/2020   PFIZER(Purple Top)SARS-COV-2 Vaccination 01/07/2020, 01/27/2020   Pneumococcal Polysaccharide-23 12/27/2020   Td 06/22/2000   Tdap 11/02/2010, 12/27/2020    TDAP status: Up to date  Flu Vaccine status: Up to date  Pneumococcal vaccine status: Due, Education has been provided regarding the importance of this vaccine. Advised may receive this vaccine at local pharmacy or Health Dept. Aware to provide a copy of the vaccination record if obtained from local pharmacy or Health Dept. Verbalized acceptance and understanding.  Covid-19 vaccine status: Declined, Education has been provided regarding the importance of this vaccine but patient still declined. Advised may receive this vaccine at local pharmacy or Health Dept.or vaccine clinic. Aware to provide a copy of the vaccination record if obtained from local pharmacy or Health Dept. Verbalized acceptance and understanding.  Qualifies for Shingles Vaccine? No   Zostavax completed No   Shingrix Completed?: No.    Education has been provided regarding the importance of this vaccine. Patient has been advised to call insurance company to determine out of pocket expense if they have not yet received this vaccine. Advised may also receive vaccine at local pharmacy or Health Dept. Verbalized acceptance and understanding.  Screening Tests Health Maintenance  Topic Date Due   Zoster Vaccines- Shingrix (1 of 2) Never done   Pneumonia Vaccine 65+ Years old (2 of 2 - PCV) 12/27/2021   Colonoscopy  02/19/2023   INFLUENZA VACCINE  02/21/2023   Medicare Annual Wellness (AWV)  02/04/2024   DTaP/Tdap/Td (4 - Td or Tdap) 12/28/2030   Hepatitis C Screening  Completed   HIV  Screening  Completed   HPV VACCINES  Aged Out   COVID-19 Vaccine  Discontinued    Health Maintenance  Health Maintenance Due  Topic Date Due   Zoster Vaccines- Shingrix (1 of 2) Never done   Pneumonia Vaccine 47+ Years old (2 of 2 - PCV) 12/27/2021   Colonoscopy  02/19/2023    Colorectal cancer screening: Type of screening: Colonoscopy. Completed 02/18/2018. Repeat every 5 years  Lung Cancer Screening: (Low Dose CT Chest recommended if Age 75-80 years, 20 pack-year currently smoking OR have quit w/in 15years.) does not qualify.   Lung Cancer Screening Referral: N/A  Additional Screening:  Hepatitis C Screening: does not qualify; Completed 06/22/2020  Vision Screening: Recommended annual ophthalmology exams for early detection of glaucoma and other disorders of the eye. Is  the patient up to date with their annual eye exam?  No  Who is the provider or what is the name of the office in which the patient attends annual eye exams? N/A If pt is not established with a provider, would they like to be referred to a provider to establish care? Yes .   Dental Screening: Recommended annual dental exams for proper oral hygiene    Community Resource Referral / Chronic Care Management: CRR required this visit?  Yes   CCM required this visit?  No    Plan:     I have personally reviewed and noted the following in the patient's chart:   Medical and social history Use of alcohol, tobacco or illicit drugs  Current medications and supplements including opioid prescriptions. Patient is not currently taking opioid prescriptions. Functional ability and status Nutritional status Physical activity Advanced directives List of other physicians Hospitalizations, surgeries, and ER visits in previous 12 months Vitals Screenings to include cognitive, depression, and falls Referrals and appointments  In addition, I have reviewed and discussed with patient certain preventive protocols, quality  metrics, and best practice recommendations. A written personalized care plan for preventive services as well as general preventive health recommendations were provided to patient.     Cala Bradford, CMA   02/04/2023   After Visit Summary: (Mail) Due to this being a telephonic visit, the after visit summary with patients personalized plan was offered to patient via mail   Nurse Notes: Face-To-Face Visit  Mr. Mczeal , Thank you for taking time to come for your Medicare Wellness Visit. I appreciate your ongoing commitment to your health goals. Please review the following plan we discussed and let me know if I can assist you in the future.   These are the goals we discussed:  Goals      Blood Pressure < 140/90        This is a list of the screening recommended for you and due dates:  Health Maintenance  Topic Date Due   Zoster (Shingles) Vaccine (1 of 2) Never done   Pneumonia Vaccine (2 of 2 - PCV) 12/27/2021   Colon Cancer Screening  02/19/2023   Flu Shot  02/21/2023   Medicare Annual Wellness Visit  02/04/2024   DTaP/Tdap/Td vaccine (4 - Td or Tdap) 12/28/2030   Hepatitis C Screening  Completed   HIV Screening  Completed   HPV Vaccine  Aged Out   COVID-19 Vaccine  Discontinued

## 2023-02-04 NOTE — Patient Instructions (Addendum)
Thank you, [name] for allowing [place] to provide your care today.   Kidney disease Your kidneys looked stable for last few years. The best thing to do for your kidneys is to continue taking blood pressure medications.  Blood pressure Your BP looked go with combo pill. If you are having more trouble with making it to the bathroom please let us know. You are on good medications now but I want to make sure they aren't causing side effects that are decreasing your quality of life.  Your weight looked stable from last visit. You have lost about 3 lbs.  I have attached information to schedule colonoscopy below. [facility] in [place], [place]   Address: [address] [place], [place], [place] [address] Phone: [phone]  I have ordered the following medication/changed the following medications:   Stop the following medications: Medications Discontinued During This Encounter  Medication Reason   varenicline (CHANTIX) 0.5 MG tablet      Start the following medications: No orders of the defined types were placed in this encounter.    Follow up: 2-3 months   We look forward to seeing you next time. Please call our clinic at [phone] if you have any questions or concerns. The best time to call is Monday-Friday from 9am-4pm, but there is someone available 24/7. If after hours or the weekend, call the main hospital number and ask for the Internal Medicine Resident On-Call. If you need medication refills, please notify your pharmacy one week in advance and they will send [place] a request.   Thank you for trusting me with your care. Wishing you the best!   Rudene Christians, DO [facility]

## 2023-02-05 NOTE — Progress Notes (Signed)
 Internal Medicine Clinic Attending  Case discussed with the resident at the time of the visit.  We reviewed the resident's history and exam and pertinent patient test results.  I agree with the assessment, diagnosis, and plan of care documented in the resident's note.  

## 2023-02-19 NOTE — Progress Notes (Signed)
Internal Medicine Clinic Attending  Case and documentation of Dr. Masters  reviewed.  I reviewed the AWV findings.  I agree with the assessment, diagnosis, and plan of care documented in the AWV note.     

## 2023-03-07 ENCOUNTER — Ambulatory Visit (INDEPENDENT_AMBULATORY_CARE_PROVIDER_SITE_OTHER): Payer: Medicare Other | Admitting: Podiatry

## 2023-03-07 ENCOUNTER — Encounter: Payer: Self-pay | Admitting: Podiatry

## 2023-03-07 ENCOUNTER — Ambulatory Visit: Payer: Medicare Other

## 2023-03-07 ENCOUNTER — Ambulatory Visit (INDEPENDENT_AMBULATORY_CARE_PROVIDER_SITE_OTHER): Payer: Medicare Other

## 2023-03-07 DIAGNOSIS — M779 Enthesopathy, unspecified: Secondary | ICD-10-CM

## 2023-03-07 DIAGNOSIS — M62461 Contracture of muscle, right lower leg: Secondary | ICD-10-CM | POA: Diagnosis not present

## 2023-03-07 DIAGNOSIS — M76811 Anterior tibial syndrome, right leg: Secondary | ICD-10-CM

## 2023-03-07 MED ORDER — IBUPROFEN 800 MG PO TABS: 800.0000 mg | ORAL_TABLET | Freq: Three times a day (TID) | ORAL | 0 refills | Status: AC | PRN

## 2023-03-07 NOTE — Patient Instructions (Signed)
Call to schedule physical therapy: [facility] at [place] [address]  [phone]  

## 2023-03-07 NOTE — Progress Notes (Signed)
  Subjective:  Patient ID: Jonathan [name], male    DOB: April 05, 1956,  MRN: [id number]  Chief Complaint  Patient presents with   Foot Pain    "I have pain in my foot.  I think it's the plate." N - pain in foot L - medial arch area D - 1 week O - suddenly C - sharp shooting pain, swelling A - touch, walking T - used muscle rub    67 y.o. male presents with the above complaint. History confirmed with patient.  He feels it in the front of the ankle when he is walking from the ankle down into the medial arch  Objective:  Physical Exam: warm, good capillary refill, no trophic changes or ulcerative lesions, normal DP and PT pulses, normal sensory exam, and pain along the tibialis anterior tendon to its insertion worse with resisted dorsiflexion  Radiographs: Multiple views x-ray of the right foot: no fracture, dislocation, swelling or degenerative changes noted Assessment:   1. Anterior tibialis tendinitis, right   2. Gastrocnemius equinus of right lower extremity      Plan:  Patient was evaluated and treated and all questions answered.  We reviewed his clinical exam findings discussed that he likely has anterior tibial tendinitis.  There is no evidence of rupture but I discussed with him this could be a prodromal syndrome proceeding or rupture I would recommend rest and immobilization in a cam walker boot which was dispensed today.  We discussed this rationale.  Ibuprofen Rx for Aner milligrams sent to use as needed.  Also recommend physical therapy plan and referral for this was sent.  Follow-up in 1 month.  No follow-ups on file.

## 2023-03-22 ENCOUNTER — Other Ambulatory Visit: Payer: Self-pay | Admitting: Internal Medicine

## 2023-03-26 ENCOUNTER — Encounter: Payer: Self-pay | Admitting: Internal Medicine

## 2023-03-26 ENCOUNTER — Telehealth: Payer: Self-pay | Admitting: *Deleted

## 2023-03-26 ENCOUNTER — Ambulatory Visit (INDEPENDENT_AMBULATORY_CARE_PROVIDER_SITE_OTHER): Payer: Medicare Other | Admitting: Internal Medicine

## 2023-03-26 VITALS — BP 112/71 | HR 63 | Ht 73.5 in | Wt 174.5 lb

## 2023-03-26 DIAGNOSIS — F1090 Alcohol use, unspecified, uncomplicated: Secondary | ICD-10-CM

## 2023-03-26 DIAGNOSIS — F17210 Nicotine dependence, cigarettes, uncomplicated: Secondary | ICD-10-CM | POA: Diagnosis not present

## 2023-03-26 DIAGNOSIS — I10 Essential (primary) hypertension: Secondary | ICD-10-CM | POA: Diagnosis not present

## 2023-03-26 DIAGNOSIS — Z789 Other specified health status: Secondary | ICD-10-CM | POA: Diagnosis not present

## 2023-03-26 DIAGNOSIS — Z0000 Encounter for general adult medical examination without abnormal findings: Secondary | ICD-10-CM | POA: Insufficient documentation

## 2023-03-26 DIAGNOSIS — D369 Benign neoplasm, unspecified site: Secondary | ICD-10-CM | POA: Diagnosis not present

## 2023-03-26 DIAGNOSIS — R634 Abnormal weight loss: Secondary | ICD-10-CM | POA: Diagnosis not present

## 2023-03-26 DIAGNOSIS — K219 Gastro-esophageal reflux disease without esophagitis: Secondary | ICD-10-CM

## 2023-03-26 DIAGNOSIS — N1831 Chronic kidney disease, stage 3a: Secondary | ICD-10-CM

## 2023-03-26 DIAGNOSIS — I129 Hypertensive chronic kidney disease with stage 1 through stage 4 chronic kidney disease, or unspecified chronic kidney disease: Secondary | ICD-10-CM

## 2023-03-26 DIAGNOSIS — F17200 Nicotine dependence, unspecified, uncomplicated: Secondary | ICD-10-CM

## 2023-03-26 MED ORDER — VARENICLINE TARTRATE 0.5 MG PO TABS
ORAL_TABLET | ORAL | 3 refills | Status: DC
Start: 2023-03-26 — End: 2023-03-26

## 2023-03-26 MED ORDER — VARENICLINE TARTRATE 0.5 MG PO TABS
ORAL_TABLET | ORAL | 3 refills | Status: DC
Start: 2023-03-26 — End: 2023-06-25

## 2023-03-26 NOTE — Progress Notes (Signed)
Attestation for Student Documentation:  I personally was present and re-performed the history, physical exam and medical decision-making activities of this service and have verified that the service and findings are accurately documented in the student's note.  Dickie La, MD 03/26/2023, 2:33 PM

## 2023-03-26 NOTE — Progress Notes (Signed)
Subjective:   Patient ID: Jonathan [name] male   DOB: February 15, 1956 67 y.o.   MRN: [id number]  HPI: Jonathan [name] is a 67 y.o. male with PMH as below presenting to clinic for follow-up of chronic conditions and with initial concern about his recent CKD stage 3a diagnosis and early satiety/decreased appetite mentioned during the visit. Please see assessment and plan for details.   Past Medical History:  Diagnosis Date   Allergy    Anxiety    Blood transfusion without reported diagnosis    as baby   Chronic Bil Foot Pain 05/27/2011   Lt > Rt - since age 89 2/2 to post polio syndrome  Corns and callosities on both feet  Reconstruction surgery at age 74. Uses a brace on left leg for many years On prn tylenol for pain  No candidate for opiates due to active substance abuse Podiatry and PT (for brace eval) on 04/23/2013     Chronic Bil Foot Pain 05/27/2011   Lt > Rt - since age 80 2/2 to post polio syndrome  Corns and callosities on both feet  Reconstruction surgery at age 13. Uses a brace on left leg for many years On prn tylenol for pain  No candidate for opiates due to active substance abuse Podiatry and PT (for brace eval) on 04/23/2013    Chronic viral hepatitis C (HCC) 06/05/2006   History of blood transfusion in childhood Dx 2010 Reports history of Rx while in [place], unknown yr Elevated LFTs since 2010 and repeat 2014 U/S 2009 - no cirrhosis, repeat in 12/2013 >> normal.  Fibrosure >> pending results  Referred to Hep C clinic 04/23/2013 >> appt in 06/2013 Hep C clinic [phone] Immunity to hep A per labs at Hep C clinic  Started on Hep B series 1 st dose 02/03/2014, 2 dose given 04/23/2014. Last dose about 09/2014.  Follow with Hep C clinic        ERECTILE DYSFUNCTION 06/05/2006   Qualifier: Diagnosis of  By: Janee Morn MD, Daniel     Essential hypertension 06/05/2006   Dx in 2008  Has BP machine at home  Checks erratically  Chronic no show issues       GERD (gastroesophageal reflux disease)     occ s/s   Hypertension    Polysubstance abuse (HCC) 04/23/2013   Cocaine (sniffs powder) and marijuana No IVDU    POST-POLIO SYNDROME 06/05/2006   Hx of polio at age 11 Has spasms in his legs on and off Used Flexeril on and off for spasms 2015 May >> sent to rehab/PT. rec new leg braces/ortho     Preventative health care 05/27/2011   Colonoscopy July 2019 polyps --> rec'd f/u in 5 years   Current Outpatient Medications  Medication Sig Dispense Refill   atorvastatin (LIPITOR) 20 MG tablet Take 1 tablet (20 mg total) by mouth daily. 90 tablet 3   gabapentin (NEURONTIN) 300 MG capsule Take 1 capsule (300 mg total) by mouth at bedtime. 90 capsule 3   ibuprofen (IBU) 800 MG tablet Take 1 tablet (800 mg total) by mouth every 8 (eight) hours as needed for up to 20 days. 60 tablet 0   Olmesartan-amLODIPine-HCTZ 40-10-25 MG TABS Take 1 tablet by mouth once daily 90 tablet 3   pantoprazole (PROTONIX) 40 MG tablet Take 1 tablet by mouth once daily 90 tablet 0   varenicline (CHANTIX) 0.5 MG tablet Take 1 tablet (0.5 mg) once daily on days 1-3. Then take 1 tablet (  0.5 mg) twice daily on days 4-7. Then take two tablets (1 mg) twice daily. 127 tablet 3   No current facility-administered medications for this visit.   Family History  Problem Relation Age of Onset   Cancer Mother    Breast cancer Mother    Colon cancer Neg Hx    Colon polyps Neg Hx    Social History   Socioeconomic History   Marital status: Single    Spouse name: Not on file   Number of children: 4   Years of education: Not on file   Highest education level: Not on file  Occupational History   Not on file  Tobacco Use   Smoking status: Every Day    Current packs/day: 0.50    Average packs/day: 0.5 packs/day for 50.0 years (25.0 ttl pk-yrs)    Types: Cigarettes   Smokeless tobacco: Never   Tobacco comments:    .5 PPD  Substance and Sexual Activity   Alcohol use: Yes    Alcohol/week: 21.0 standard drinks of alcohol     Types: 21 Standard drinks or equivalent per week    Comment: beer everyday   Drug use: Yes    Types: Marijuana    Comment: marijuana   Sexual activity: Not on file  Other Topics Concern   Not on file  Social History Narrative   Right Handed    Lives in a one story home       Does not know his father   Has 2 daughters and 2 sons.   Social Determinants of Health   Financial Resource Strain: Low Risk  (02/04/2023)   Overall Financial Resource Strain (CARDIA)    Difficulty of Paying Living Expenses: Not hard at all  Food Insecurity: Food Insecurity Present (02/04/2023)   Hunger Vital Sign    Worried About Running Out of Food in the Last Year: Sometimes true    Ran Out of Food in the Last Year: Sometimes true  Transportation Needs: No Transportation Needs (02/04/2023)   PRAPARE - Administrator, Civil Service (Medical): No    Lack of Transportation (Non-Medical): No  Physical Activity: Inactive (02/04/2023)   Exercise Vital Sign    Days of Exercise per Week: 0 days    Minutes of Exercise per Session: 0 min  Stress: No Stress Concern Present (02/04/2023)   [employer] of Occupational Health - Occupational Stress Questionnaire    Feeling of Stress : Not at all  Social Connections: Socially Isolated (02/04/2023)   Social Connection and Isolation Panel [NHANES]    Frequency of Communication with Friends and Family: More than three times a week    Frequency of Social Gatherings with Friends and Family: Not on file    Attends Religious Services: Never    Database administrator or Organizations: No    Attends Banker Meetings: Never    Marital Status: Divorced   Review of Systems: ROS negative except for what is noted on the assessment and plan.  Objective:  Physical Exam: Vitals:   03/26/23 0943  BP: 112/71  Pulse: 63  SpO2: 100%  Weight: 174 lb 8 oz (79.2 kg)  Height: 6' 1.5" (1.867 m)    General appearance: well appearing; pleasant; boot on his  right leg, using a cane  Lungs: CTAB, normal respiratory effort CV: RRR, nor murmur Abdomen: Soft, non-tender; non-distended, BS present  Extremities: No peripheral edema Skin: Normal temperature, turgor and texture; no rash Psych: Appropriate affect Neuro:  Alert and oriented to person and place, no focal deficit   Assessment & Plan:  Gastroesophageal reflux disease Chronic issue of GERD managed with pantoprazole 40 mg daily, however, pt reports early satiety and decreased appetite especially in the summer. He has had 15 lb unintentional weight loss in a year. He continues to have occasional acid reflux especially when he does not use his PPI. Denies any SOB or fatigue.  No dysphagia or odynophagia. No abdominal pain or bowel symptoms. Has a hx of tubular adenoma (01/2018), due for f/u. Has no known family history of colon cancer. Mother passed away due to breast cancer. He has a 25 pack yr smoking history and drinks alcohol most days each week with >2 drinks on 3 days/week. He was previously referred to a GI specialist, however, there has been no f/u. We discussed the need for screening CSY today, as well as likely EGD given red flag signs.  Assessment  - GERD w/ unintentional weight loss and early satiety concerning for a complications of GERD vs malignancy vs other  Plan  - Refer to GI for a EGD and colonoscopy  - Lab: CMP - Continue PPI daily  Tobacco use disorder Has a 25 pack year history (started smoking when 67 yo). Has tried nicotine patches many years ago. A year ago when Chantix was prescribed, it was too expensive for him to get them. He is very willing to try again given he is >65 and Medicare could cover it now. Also open to receive behavioral health counseling. Discussed lung cancer screening and patient is agreeable to proceed, discussed risk vs benefit.  Plan  - Start Chantix (1 tablet (0.5 mg) once daily on days 1-3. Then take 1 tablet (0.5 mg) twice daily on days 4-7. Then  take two tablets (1 mg) twice daily) - if insurance does not cover it, try nicotine patches and gum  - Start behavioral counseling  - Referral for CT lung cancer screening  Stage 3a chronic kidney disease (HCC) Patient thought he had a "spot" in the kidneys. Educated he had a diagnosis of CKD 3a, which is early stages. The best way to prevent from further damage to kidneys is controlling his htn, stop smoking and decrease alcohol use. Discussed avoidance of NSAIDs. No albuminuria at last visit.  Plan  -CMP   Healthcare maintenance - Pt received first dose of shingles and plans to get second dose soon.  - Plans to get flu and pneumococcal vaccine afterwards.  - Due for low dose CT scan of chest; referral placed.  Essential hypertension Chronic issue. BP well controlled <130/80.   Plan - Continue Olmesartan-amLODIPine-HCTZ . -CMP  Tubular adenoma Additional referral sent to GI today for CSY due to history of tubular adenoma, recall due 2024.  Alcohol use Mr. Marcille reports drinking alcohol on most days. When asked how many days a week he has more than 2 drinks he states about 3 days/week. Otherwise will drink a glass of wine or 2 beers/day. We discussed limiting alcohol use to no more than 2 drinks/day. Will continue to address at future visits.  Weight loss In the setting of early satiety, it was noted that Mr. Matamoros has lost about 15 pounds since last year. He reports feeling full more quickly but denies dysphagia, odynophagia, or abdominal pain. Continues on PPI for GERD. Continued smoking and alcohol use. We discussed these risk factors today. Referred to GI for consideration of EGD. Needs lung cancer screening and CSY.  PSA <  3 earlier this year.  Plan -Referral to GI for EGD/CSY -Referral for lung cancer screening -CMP -F/u 3 months  Patient seen and discussed with Dr. Sol Blazing

## 2023-03-26 NOTE — Assessment & Plan Note (Addendum)
-   Pt received first dose of shingles and plans to get second dose soon.  - Plans to get flu and pneumococcal vaccine afterwards.  - Due for low dose CT scan of chest; referral placed.

## 2023-03-26 NOTE — Assessment & Plan Note (Signed)
Additional referral sent to GI today for CSY due to history of tubular adenoma, recall due 2024.

## 2023-03-26 NOTE — Patient Instructions (Addendum)
It was wonderful to see you today!!  Try to stay away from ibuprofen (Advil) and naproxen (Aleve) to help protect your kidneys. You may take acetaminophen (Tylenol) up to 1,000 mg three times daily as needed for pain.  I have sent a referral to GI for colonoscopy and discussion of your reflux symptoms.  I have sent a prescription for Chantix to the pharmacy. Please let me know if this is too expensive and we can talk about other options for quitting smoking.  You are due for the flu and pneumonia vaccines. You can get these at our clinic or your pharmacy when ready.  Please call the eye doctor to schedule an appointment: [facility]   [phone]

## 2023-03-26 NOTE — Assessment & Plan Note (Addendum)
Chronic issue. BP well controlled <130/80.   Plan - Continue Olmesartan-amLODIPine-HCTZ . -CMP

## 2023-03-26 NOTE — Assessment & Plan Note (Addendum)
Has a 25 pack year history (started smoking when 67 yo). Has tried nicotine patches many years ago. A year ago when Chantix was prescribed, it was too expensive for him to get them. He is very willing to try again given he is >65 and Medicare could cover it now. Also open to receive behavioral health counseling. Discussed lung cancer screening and patient is agreeable to proceed, discussed risk vs benefit.  Plan  - Start Chantix (1 tablet (0.5 mg) once daily on days 1-3. Then take 1 tablet (0.5 mg) twice daily on days 4-7. Then take two tablets (1 mg) twice daily) - if insurance does not cover it, try nicotine patches and gum  - Start behavioral counseling  - Referral for CT lung cancer screening  Addendum 9/5 Chantix $47 and patient does not have the funds right now. We discussed combination NRT and he is willing to see what the cost is. Patch and gum sent to patient pharmacy.

## 2023-03-26 NOTE — Assessment & Plan Note (Signed)
Mr. Hubler reports drinking alcohol on most days. When asked how many days a week he has more than 2 drinks he states about 3 days/week. Otherwise will drink a glass of wine or 2 beers/day. We discussed limiting alcohol use to no more than 2 drinks/day. Will continue to address at future visits.

## 2023-03-26 NOTE — Progress Notes (Signed)
  Care Coordination   Note   03/26/2023 Name: Jonathan [name] MRN: [id number] DOB: [birth year]  [name] is a 67 y.o. year old male who sees Dickie La, MD for primary care. I reached out to Shelba Flake by phone today to offer care coordination services.  [name] was given information about Care Coordination services today including:   The Care Coordination services include support from the care team which includes your Nurse Coordinator, Clinical Social Worker, or Pharmacist.  The Care Coordination team is here to help remove barriers to the health concerns and goals most important to you. Care Coordination services are voluntary, and the patient may decline or stop services at any time by request to their care team member.   Care Coordination Consent Status: Patient agreed to services and verbal consent obtained.   Follow up plan:  Telephone appointment with care coordination team member scheduled for:  04/02/23  Encounter Outcome:  Pt. Scheduled  [facility] Coordination Care Guide  Direct Dial: [phone]

## 2023-03-26 NOTE — Assessment & Plan Note (Addendum)
Patient thought he had a "spot" in the kidneys. Educated he had a diagnosis of CKD 3a, which is early stages. The best way to prevent from further damage to kidneys is controlling his htn, stop smoking and decrease alcohol use. Discussed avoidance of NSAIDs. No albuminuria at last visit.  Plan  -CMP

## 2023-03-26 NOTE — Assessment & Plan Note (Addendum)
In the setting of early satiety, it was noted that Jonathan [name] has lost about 15 pounds since last year. He reports feeling full more quickly but denies dysphagia, odynophagia, or abdominal pain. Continues on PPI for GERD. Continued smoking and alcohol use. We discussed these risk factors today. Referred to GI for consideration of EGD. Needs lung cancer screening and CSY.  PSA <3 earlier this year.  Plan -Referral to GI for EGD/CSY -Referral for lung cancer screening -CMP -F/u 3 months

## 2023-03-26 NOTE — Assessment & Plan Note (Addendum)
Chronic issue of GERD managed with pantoprazole 40 mg daily, however, pt reports early satiety and decreased appetite especially in the summer. He has had 15 lb unintentional weight loss in a year. He continues to have occasional acid reflux especially when he does not use his PPI. Denies any SOB or fatigue.  No dysphagia or odynophagia. No abdominal pain or bowel symptoms. Has a hx of tubular adenoma (01/2018), due for f/u. Has no known family history of colon cancer. Mother passed away due to breast cancer. He has a 25 pack yr smoking history and drinks alcohol most days each week with >2 drinks on 3 days/week. He was previously referred to a GI specialist, however, there has been no f/u. We discussed the need for screening CSY today, as well as likely EGD given red flag signs.  Assessment  - GERD w/ unintentional weight loss and early satiety concerning for a complications of GERD vs malignancy vs other  Plan  - Refer to GI for a EGD and colonoscopy  - Lab: CMP - Continue PPI daily

## 2023-03-27 LAB — CMP14 + ANION GAP
ALT: 21 IU/L (ref 0–44)
AST: 27 IU/L (ref 0–40)
Albumin: 4.3 g/dL (ref 3.9–4.9)
Alkaline Phosphatase: 84 IU/L (ref 44–121)
Anion Gap: 16 mmol/L (ref 10.0–18.0)
BUN/Creatinine Ratio: 12 (ref 10–24)
BUN: 16 mg/dL (ref 8–27)
Bilirubin Total: 0.4 mg/dL (ref 0.0–1.2)
CO2: 21 mmol/L (ref 20–29)
Calcium: 9.5 mg/dL (ref 8.6–10.2)
Chloride: 109 mmol/L — ABNORMAL HIGH (ref 96–106)
Creatinine, Ser: 1.32 mg/dL — ABNORMAL HIGH (ref 0.76–1.27)
Globulin, Total: 2.4 g/dL (ref 1.5–4.5)
Glucose: 101 mg/dL — ABNORMAL HIGH (ref 70–99)
Potassium: 3.6 mmol/L (ref 3.5–5.2)
Sodium: 146 mmol/L — ABNORMAL HIGH (ref 134–144)
Total Protein: 6.7 g/dL (ref 6.0–8.5)
eGFR: 60 mL/min/{1.73_m2} (ref >59)

## 2023-03-28 MED ORDER — NICOTINE POLACRILEX 4 MG MT GUM
4.0000 mg | CHEWING_GUM | OROMUCOSAL | 1 refills | Status: DC | PRN
Start: 2023-03-28 — End: 2023-06-25

## 2023-03-28 MED ORDER — NICOTINE 21 MG/24HR TD PT24: 21.0000 mg | MEDICATED_PATCH | TRANSDERMAL | 1 refills | Status: DC

## 2023-03-28 NOTE — Progress Notes (Signed)
Stable kidney function, mildly elevated sodium. Reviewed with Mr. Balboni 9/5.

## 2023-03-28 NOTE — Addendum Note (Signed)
Addended by: Dickie La on: 03/28/2023 08:53 AM   Modules accepted: Orders

## 2023-04-03 ENCOUNTER — Ambulatory Visit: Payer: Self-pay

## 2023-04-03 NOTE — Patient Instructions (Signed)
Visit Information  Thank you for taking time to visit with me today. Please don't hesitate to contact me if I can be of assistance to you.   Following are the goals we discussed today:   Goals Addressed             This Visit's Progress    I need to quit smoking       Patient Self Care Activities:  Patient will monitor as a habit during cravings  Patient will commit to reducing tobacco consumption Tobacco abuse of 48 years; currently smoking 1/2 ppd Reports smoking within 30 minutes of waking up Reports triggers to smoke include: N?A Reports motivation to quit smoking includes: health reasons On a scale of 1-10, reports MOTIVATION to quit is 5/10 On a scale of 1-10, reports CONFIDENCE in quitting is 8/10 Provided contact information for [facility] (1-800-QUIT-NOW). Patient will outreach this group for support. Discussed plans with patient for ongoing care management follow up and provided patient with direct contact information for care management team Provided patient with smoking cessation educational materials via mail Reviewed scheduled/upcoming provider appointments including Referred patient to pharmacy team Provided contact information for  [facility] (1-800-QUIT-NOW).          Our next appointment is by telephone on 04/24/23 at 1030 am  Please call the care guide team at [phone] if you need to cancel or reschedule your appointment.   If you are experiencing a Mental Health or Behavioral Health Crisis or need someone to talk to, please call 1-800-273-TALK (toll free, 24 hour hotline)  The patient verbalized understanding of instructions, educational materials, and care plan provided today.    Juanell Fairly RN, BSN, [facility] Glass blower/designer Phone: [phone]

## 2023-04-03 NOTE — Patient Outreach (Signed)
  Care Coordination   Initial Visit Note   04/03/2023 Name: Jonathan [name] MRN: [id number] DOB: Feb 25, 1956  Jonathan [name] is a 67 y.o. year old male who sees Jonathan La, MD for primary care. I spoke with  Jonathan [name] by phone today.  What matters to the patients health and wellness today?  Speaking with [name], he said he has been smoking since he was 67 years old. Currently, he smokes about ten cigarettes per day and is interested in quitting. We also discussed about checking his blood pressure at least once peer week.    Goals Addressed             This Visit's Progress    I need to quit smoking       Patient Self Care Activities:  Patient will monitor as a habit during cravings  Patient will commit to reducing tobacco consumption Tobacco abuse of 48 years; currently smoking 1/2 ppd Reports smoking within 30 minutes of waking up Reports triggers to smoke include: N?A Reports motivation to quit smoking includes: health reasons On a scale of 1-10, reports MOTIVATION to quit is 5/10 On a scale of 1-10, reports CONFIDENCE in quitting is 8/10 Provided contact information for [place] [facility] (1-800-QUIT-NOW). Patient will outreach this group for support. Discussed plans with patient for ongoing care management follow up and provided patient with direct contact information for care management team Provided patient with smoking cessation educational materials via mail Reviewed scheduled/upcoming provider appointments including Referred patient to pharmacy team Provided contact information for [place] [facility] (1-800-QUIT-NOW).          SDOH assessments and interventions completed:  Yes  SDOH Interventions Today    Flowsheet Row Most Recent Value  SDOH Interventions   Food Insecurity Interventions --  [Care guuides]  Transportation Interventions Intervention Not Indicated        Care Coordination Interventions:  Yes, provided   Interventions Today     Flowsheet Row Most Recent Value  Chronic Disease   Chronic disease during today's visit Hypertension (HTN)  [Check blood pressure once per week]  General Interventions   General Interventions Discussed/Reviewed General Interventions Discussed, General Interventions Reviewed, Communication with  [Discussed mnitoringhis blood pressure once weekly, will reach out to [employer] for resources for food and pharmacy tech for smoking cessation]  Communication with Pharmacists, Social Work  Exercise Interventions   Exercise Discussed/Reviewed Physical Activity  Physical Activity Discussed/Reviewed Physical Activity Discussed  [Discussed with the patient about silver snakers and movement ,how it can improve his health]  Education Interventions   Education Provided Provided Education  Provided Verbal Education On Other  [Will send information about smoking cessation]  Pharmacy Interventions   Pharmacy Dicussed/Reviewed Pharmacy Topics Discussed  [patient states that he cant afforf some of the smoking cessation drugs]  Safety Interventions   Safety Discussed/Reviewed Safety Discussed        Follow up plan: Follow up call scheduled for 04/24/23 1030 am    Encounter Outcome:  Patient Visit Completed   Juanell Fairly RN, BSN, [facility]   Care Coordinator Phone: [phone]

## 2023-04-04 ENCOUNTER — Other Ambulatory Visit (HOSPITAL_COMMUNITY): Payer: Self-pay

## 2023-04-04 ENCOUNTER — Encounter: Payer: Self-pay | Admitting: Internal Medicine

## 2023-04-05 ENCOUNTER — Ambulatory Visit
Admission: RE | Admit: 2023-04-05 | Discharge: 2023-04-05 | Disposition: A | Discharge status: Home / Self Care | Payer: Medicare Other | Source: Ambulatory Visit | Attending: Internal Medicine | Admitting: Internal Medicine

## 2023-04-05 ENCOUNTER — Encounter: Payer: Self-pay | Admitting: Nurse Practitioner

## 2023-04-05 DIAGNOSIS — F17200 Nicotine dependence, unspecified, uncomplicated: Secondary | ICD-10-CM

## 2023-04-05 DIAGNOSIS — Z87891 Personal history of nicotine dependence: Secondary | ICD-10-CM | POA: Diagnosis not present

## 2023-04-05 NOTE — Therapy (Unsigned)
OUTPATIENT PHYSICAL THERAPY LOWER EXTREMITY EVALUATION   Patient Name: Jonathan [name] MRN: [id number] DOB:Jul 22, 1956, 67 y.o., male Today's Date: 04/08/2023  END OF SESSION:  PT End of Session - 04/08/23 1022     Visit Number 1    Number of Visits 12    Date for PT Re-Evaluation 05/20/23    Authorization Type [employer] MCR    PT Start Time 1020    PT Stop Time 1114    PT Time Calculation (min) 54 min    Activity Tolerance Patient tolerated treatment well    Behavior During Therapy WFL for tasks assessed/performed             Past Medical History:  Diagnosis Date   Allergy    Anxiety    Blood transfusion without reported diagnosis    as baby   Chronic Bil Foot Pain 05/27/2011   Lt > Rt - since age 65 2/2 to post polio syndrome  Corns and callosities on both feet  Reconstruction surgery at age 3. Uses a brace on left leg for many years On prn tylenol for pain  No candidate for opiates due to active substance abuse Podiatry and PT (for brace eval) on 04/23/2013     Chronic Bil Foot Pain 05/27/2011   Lt > Rt - since age 52 2/2 to post polio syndrome  Corns and callosities on both feet  Reconstruction surgery at age 86. Uses a brace on left leg for many years On prn tylenol for pain  No candidate for opiates due to active substance abuse Podiatry and PT (for brace eval) on 04/23/2013    Chronic viral hepatitis C (HCC) 06/05/2006   History of blood transfusion in childhood Dx 2010 Reports history of Rx while in [place], unknown yr Elevated LFTs since 2010 and repeat 2014 U/S 2009 - no cirrhosis, repeat in 12/2013 >> normal.  Fibrosure >> pending results  Referred to Hep C clinic 04/23/2013 >> appt in 06/2013 Hep C clinic [phone] Immunity to hep A per labs at Hep C clinic  Started on Hep B series 1 st dose 02/03/2014, 2 dose given 04/23/2014. Last dose about 09/2014.  Follow with Hep C clinic        ERECTILE DYSFUNCTION 06/05/2006   Qualifier: Diagnosis of  By: Janee Morn MD, Daniel      Essential hypertension 06/05/2006   Dx in 2008  Has BP machine at home  Checks erratically  Chronic no show issues       GERD (gastroesophageal reflux disease)    occ s/s   Hypertension    Polysubstance abuse (HCC) 04/23/2013   Cocaine (sniffs powder) and marijuana No IVDU    POST-POLIO SYNDROME 06/05/2006   Hx of polio at age 52 Has spasms in his legs on and off Used Flexeril on and off for spasms 2015 May >> sent to rehab/PT. rec new leg braces/ortho     Preventative health care 05/27/2011   Colonoscopy July 2019 polyps --> rec'd f/u in 5 years   Past Surgical History:  Procedure Laterality Date   COLONOSCOPY     FRACTURE SURGERY     KNEE ARTHROSCOPY Right    Patient Active Problem List   Diagnosis Date Noted   Healthcare maintenance 03/26/2023   Alcohol use 03/26/2023   Weight loss 03/26/2023   Blurry vision, bilateral 02/04/2023   Prostate cancer screening 10/08/2022   Prediabetes 10/08/2022   Stage 3a chronic kidney disease (HCC) 10/08/2022   Hyperlipidemia 10/08/2022   Muscle  tension pain 05/22/2022   Gastroesophageal reflux disease 04/19/2021   Tobacco use disorder 06/23/2020   Hammer toe of left foot 11/19/2019   Tubular adenoma 01/22/2019   Spinal stenosis 12/16/2015   POST-POLIO SYNDROME 06/05/2006   Essential hypertension 06/05/2006    PCP: Dickie La MD   REFERRING PROVIDER: Edwin Cap, DPM  REFERRING DIAG: [phone] (ICD-10-CM) - Anterior tibialis tendinitis, right M62.461 (ICD-10-CM) - Gastrocnemius equinus of right lower extremity  THERAPY DIAG:  Pain in right ankle and joints of right foot  Stiffness of right ankle, not elsewhere classified  Difficulty in walking, not elsewhere classified  Rationale for Evaluation and Treatment: Rehabilitation  ONSET DATE: 1 month ago   SUBJECTIVE:   SUBJECTIVE STATEMENT: Pt with history of polio affecting L> R LE, with foot reconstruction many yrs ago.   This most recent pain began about a month ago.  At  the time he was doing a lot of driving.  He was given a walking boot but has worn it less and less lately.  He can still [employer] as he wants to, has been able to drive with the boot. He continues to have pain with walking (sore, tender) without the boot this has improved overall.  His LLE is weaker and the doctor has told me I am just overworking my Rt foot to compensate.   PERTINENT HISTORY: Continues to have paresthesias and neuropathic pain in lower extremities, previously evaluated and felt secondary to lumbar stenosis > post-polio syndrome. Mr. Jacinto reports good functional ability and has a cane today to help with mobility. He is interested in attending water aerobics for further strengthening and mobility. I have encouraged him to discuss Silver Sneakers with his insurance and the [employer] in order to start classes. PAIN:  Are you having pain? Yes: NPRS scale: minimal, 3/10 Pain location: Rt foot  Pain description: tender, sore  Aggravating factors: standing, walking  Relieving factors: boot, rest  PRECAUTIONS: Fall/balance due to LLE weakness   RED FLAGS: None   WEIGHT BEARING RESTRICTIONS: No  FALLS:  Has patient fallen in last 6 months? NoDoes stumbles quite a bit.   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   PLOF: Independent with basic ADLs, Independent with household mobility without device, Independent with community mobility with device, Needs assistance with ADLs, and Leisure: assist for lower body, shoes   PATIENT GOALS: I want to get this pain out of it   NEXT MD VISIT: after PT   OBJECTIVE:   DIAGNOSTIC FINDINGS: Radiographs: Multiple views x-ray of the right foot: no fracture, dislocation, swelling or degenerative changes noted  PATIENT  SURVEYS:  FOTO 51%  COGNITION: Overall cognitive status: Within functional limits for tasks assessed     SENSATION: WFL does note some occasional tingling/numbness in feet, mild   EDEMA:  NT , none noted   MUSCLE LENGTH: Hamstrings: tight  Thomas test:NT   POSTURE: rounded shoulders, posterior pelvic tilt, and flexed trunk  Foot : pes equinus , Rt toes hyperextended and hallux valgus.  Lt. Foot affected similarly with decreased toe motion  PALPATION: Sore, tender along dorsum of foot from Gr toe up along the line of anterior tibialis.  He reports hypersensitivity and pain when it first began.   LOWER EXTREMITY ROM:  Active ROM Right  eval Left eval  Hip flexion WNL WNL   Hip extension    Hip abduction    Hip adduction    Hip internal rotation WNL WNL   Hip external rotation WNL WNL   Knee flexion WNL  WNL   Knee extension WFL   Ankle dorsiflexion -9 active  -8 passively   Ankle plantarflexion 45-50 deg    Ankle inversion 40   Ankle eversion 20    (Blank rows = not tested)  LOWER EXTREMITY MMT:  MMT Right eval Left eval  Hip flexion 4+ 4-  Hip extension    Hip abduction    Hip adduction    Hip internal rotation    Hip external rotation    Knee flexion 5 4+  Knee extension 5 4+  Ankle dorsiflexion 5 3-  Ankle plantarflexion 4 3  Ankle inversion 4 4-  Ankle eversion 4 4-   (Blank rows = not tested)  LOWER EXTREMITY SPECIAL TESTS:  NT   FUNCTIONAL TESTS:  5 times sit to stand: 23 sec   GAIT: Distance walked: 100 Assistive device utilized: Single point cane Level of assistance: Modified independence Comments: stumbled x 1    TODAY'S TREATMENT:                                                                                                                              DATE: 04/08/23  Therapeutic Exercise: Calf and gastroc stretching, ankle DF red and INV red x 15  Self care: POC, HEP  PATIENT EDUCATION:  Education details: POC, HEP, balance and  weakness  Person educated: Patient Education method: Explanation, Demonstration, Tactile cues, Verbal cues, and Handouts Education comprehension: verbalized understanding and needs further education  HOME EXERCISE PROGRAM: Access Code: [email or web address] URL: [email or web address] Date: 04/08/2023 Prepared by: Karie Mainland  Exercises - Seated Calf Stretch with Strap  - 1 x daily - 7 x weekly - 1 sets - 10 reps - 30 hold - Seated Ankle Dorsiflexion with Resistance  - 1 x daily - 7 x weekly - 2 sets - 10 reps - 5 hold - Seated Figure 4 Ankle Inversion with Resistance  - 1 x daily - 7 x weekly - 2 sets - 10 reps - 5 hold - Gastroc Stretch on Wall  - 1 x daily - 7 x weekly - 1 sets - 5 reps - 30 hold  ASSESSMENT:  CLINICAL IMPRESSION: Patient is a 67 y.o. male who was seen today for physical therapy evaluation and treatment for Rt anterior tibialis tendinitis.  His condition is complicated by left ankle foot weakness and deformity secondary to postpolio syndrome.   OBJECTIVE IMPAIRMENTS: Abnormal gait, decreased balance, decreased mobility, difficulty walking, decreased ROM, decreased strength, increased fascial restrictions, impaired sensation, postural dysfunction, and pain.   ACTIVITY LIMITATIONS: lifting, standing, squatting, stairs, transfers, and locomotion level  PARTICIPATION LIMITATIONS: interpersonal relationship, driving, shopping, community activity, and occupation  PERSONAL FACTORS: Past/current experiences and 1-2 comorbidities:  Previous foot surgery x 2, left ankle foot deformity and weakness  are also affecting patient's functional outcome.   REHAB POTENTIAL: Excellent  CLINICAL DECISION MAKING: Evolving/moderate complexity  EVALUATION COMPLEXITY: Moderate   GOALS: Goals reviewed with patient? Yes  SHORT TERM GOALS:  2 weeks (04/22/23)     Patient will be independent with basic home program for right lower extremity range of motion and strength Baseline:  Given on evaluation Goal status: INITIAL  2.  Patient will complete balance screening to assess fall risk Baseline: Unknown Goal status: INITIAL  3.  Pt will complete 2-6 min walk test and set goal  Baseline: Unknown  Goal status: INITIAL   LONG TERM GOALS: Target date: 05/20/2023    Pt will be I with HEP for long term health and mobility (including aquatics, if able)  Baseline: unknown, would like to return to aqua fitness  Goal status: INITIAL  2.  Pt will improve FOTO score to 64% or better to demo improved functional mobility  Baseline: 48% Goal status: INITIAL  3.  Pt will be able to walk 300 feet in the community with cane without increased foot pain  Baseline: limps, boot intermittently and pain is min to mod  Goal status: INITIAL  4.  Pt will return to driving without the boot ([employer]) without increased pain  Baseline: has limited this a great deal  Goal status: INITIAL  5.  Pt will be able to demo Rt ankle DF to +5 deg for better gait and stair negotiation  Baseline: lacks 9 deg  Goal status: INITIAL  6.  Functional testing, balance goals TBA based on assessment  Baseline: NT  Goal status: INITIAL   PLAN:  PT FREQUENCY: 2x/week  PT DURATION: 6 weeks  PLANNED INTERVENTIONS: Therapeutic exercises, Therapeutic activity, Neuromuscular re-education, Balance training, Gait training, Patient/Family education, Self Care, Joint mobilization, Aquatic Therapy, Cryotherapy, Moist heat, Manual therapy, and Re-evaluation  PLAN FOR NEXT SESSION: check HEP, Nustep, 2 or 6 min walks test, balance    Angelyse Heslin, PT 04/08/2023, 1:17 PM   Date of referral: 03/07/23 Referring provider: Renda Rolls MD  Referring diagnosis? M76.811 (ICD-10-CM) - Anterior tibialis tendinitis, right M62.461 (ICD-10-CM) - Gastrocnemius equinus of right lower extremity Treatment diagnosis? (if different than referring diagnosis) ankle pain, ankle stiffness Rt , difficulty walking    What was this (referring dx) caused by? Work-related and Other: structural issue, deformity   Nature of Condition: Initial Onset (within last 3 months)   Laterality: Rt  Current Functional Measure Score: FOTO 48%  Objective measurements identify impairments when they are compared to normal values, the uninvolved extremity, and prior level of function.  [x]  Yes  []  No  Objective assessment of functional ability: Moderate functional limitations   Briefly describe symptoms: Pain, stiffness and LE weakness   How did symptoms start: overuse with driving compared with chronic joint issues and compensation  Average pain intensity:  Last 24 hours: min to mod (2/10-5/10)  Past week: mod (5/10)  How often does the pt experience symptoms? Frequently  How much have the symptoms interfered with usual daily activities? Moderately  How has condition changed since care began at this facility? NA - initial visit  In general, how is the patients overall health? Fair  Karie Mainland, PT 04/08/23 1:32 PM Phone: [phone] Fax: [phone]

## 2023-04-08 ENCOUNTER — Ambulatory Visit: Payer: Medicare Other | Attending: Podiatry | Admitting: Physical Therapy

## 2023-04-08 ENCOUNTER — Encounter: Payer: Self-pay | Admitting: Physical Therapy

## 2023-04-08 DIAGNOSIS — M25571 Pain in right ankle and joints of right foot: Secondary | ICD-10-CM | POA: Diagnosis not present

## 2023-04-08 DIAGNOSIS — R262 Difficulty in walking, not elsewhere classified: Secondary | ICD-10-CM | POA: Diagnosis not present

## 2023-04-08 DIAGNOSIS — M62461 Contracture of muscle, right lower leg: Secondary | ICD-10-CM | POA: Insufficient documentation

## 2023-04-08 DIAGNOSIS — M25671 Stiffness of right ankle, not elsewhere classified: Secondary | ICD-10-CM | POA: Diagnosis not present

## 2023-04-08 DIAGNOSIS — M76811 Anterior tibial syndrome, right leg: Secondary | ICD-10-CM | POA: Diagnosis not present

## 2023-04-10 ENCOUNTER — Telehealth: Payer: Self-pay

## 2023-04-10 NOTE — Patient Outreach (Signed)
Care Coordination   04/10/2023 Name: Jonathan [name] MRN: [id number] DOB: 03-Mar-1956   Care Coordination Outreach Attempts:  SW placed an unsuccessful outbound call to the patient to follow up on resource needs. SW left a HIPAA compliant voice message requesting a return call.  Follow Up Plan:  Additional outreach attempts will be made to offer the patient care coordination information and services.   Encounter Outcome:  No Answer   Care Coordination Interventions:  No, not indicated    Bevelyn Ngo, BSW, CDP [place]  [facility], [facility] Social Worker Direct Dial: [phone]  Fax: [phone]

## 2023-04-15 ENCOUNTER — Telehealth: Payer: Self-pay

## 2023-04-15 NOTE — Patient Outreach (Signed)
Care Coordination   04/15/2023 Name: Jonathan [name] MRN: [id number] DOB: 1956/02/22   Care Coordination Outreach Attempts:  A second unsuccessful outreach was attempted today to offer the patient with information about available care coordination services.  Follow Up Plan:  Additional outreach attempts will be made to offer the patient care coordination information and services.   Encounter Outcome:  No Answer   Care Coordination Interventions:  No, not indicated    Bevelyn Ngo, BSW, CDP [place]  [facility], [facility] Social Worker Direct Dial: [phone]  Fax: [phone]

## 2023-04-19 ENCOUNTER — Ambulatory Visit: Payer: Medicare Other | Admitting: Physical Therapy

## 2023-04-22 ENCOUNTER — Encounter: Payer: Self-pay | Admitting: Internal Medicine

## 2023-04-22 DIAGNOSIS — E279 Disorder of adrenal gland, unspecified: Secondary | ICD-10-CM | POA: Insufficient documentation

## 2023-04-22 DIAGNOSIS — I700 Atherosclerosis of aorta: Secondary | ICD-10-CM | POA: Insufficient documentation

## 2023-04-22 DIAGNOSIS — E278 Other specified disorders of adrenal gland: Secondary | ICD-10-CM | POA: Insufficient documentation

## 2023-04-23 ENCOUNTER — Ambulatory Visit: Payer: Medicare Other | Attending: Podiatry

## 2023-04-23 ENCOUNTER — Telehealth: Payer: Self-pay | Admitting: *Deleted

## 2023-04-23 DIAGNOSIS — R262 Difficulty in walking, not elsewhere classified: Secondary | ICD-10-CM | POA: Insufficient documentation

## 2023-04-23 DIAGNOSIS — M25571 Pain in right ankle and joints of right foot: Secondary | ICD-10-CM | POA: Insufficient documentation

## 2023-04-23 DIAGNOSIS — M25671 Stiffness of right ankle, not elsewhere classified: Secondary | ICD-10-CM | POA: Diagnosis not present

## 2023-04-23 NOTE — Progress Notes (Signed)
Care Coordination Note  04/23/2023 Name: Jonathan [name] MRN: [id number] DOB: [birth year]  Jonathan [name] is a 67 y.o. year old male who is a primary care patient of Dickie La, MD and is actively engaged with the care management team. I reached out to Shelba Flake by phone today to assist with scheduling a follow up visit with the BSW  Follow up plan: Telephone appointment with care management team member scheduled for:05/06/23  [facility] Coordination Care Guide  Direct Dial: [phone]

## 2023-04-23 NOTE — Progress Notes (Signed)
Care Coordination Note  04/23/2023 Name: ABRAHIM NORDAN MRN: [id number] DOB: 1955/09/27  NOLIN GRANIER is a 67 y.o. year old male who is a primary care patient of Dickie La, MD and is actively engaged with the care management team. I reached out to Shelba Flake by phone today to assist with scheduling an initial visit with the BSW  Follow up plan: Unsuccessful telephone outreach attempt made. A HIPAA compliant phone message was left for the patient providing contact information and requesting a return call.   [facility]  Care Coordination Care Guide  Direct Dial: [phone]

## 2023-04-23 NOTE — Progress Notes (Signed)
Repeat annual screening in 12 months. Left 13 mm adrenal nodule incidentally noted. Discussed with radiology, 2.4 HFU. Will need functional screening with overnight dexamethasone suppression test and PAC/PRC. Discussed with Mr. Burandt 10/1. Will schedule labs only f/u.

## 2023-04-23 NOTE — Patient Instructions (Signed)
Aquatic Therapy at [facility]-  What to Expect! ? ?Where:  ? ?[place] ?[facility] ?[address] [facility] ?[place], [place] [address] ?Rehab phone [phone] ? ?NOTE:  You will receive an automated phone message reminding you of your appt and it will say the appointment is at the [address] [facility] [address] [facility] clinic. ? ?        ?How to Prepare: ?Please make sure you drink 8 ounces of water about one hour prior to your pool session ?A caregiver may attend if needed with the patient to help assist as needed. A caregiver can sit in the pool room on chair. ?Please arrive IN YOUR SUIT and 15 minutes prior to your appointment - this helps to avoid delays in starting your session. ?Please make sure to attend to any toileting needs prior to entering the pool ?Locker rooms for changing are provided.   There is direct access to the pool deck form the locker room.  You can lock your belongings in a locker with lock provided. ?Once on the pool deck your therapist will ask if you have signed the Patient  Consent and Assignment of Benefits form before beginning treatment ?Your therapist may take your blood pressure prior to, during and after your session if indicated ?We usually try and create a home exercise program based on activities we do in the pool.  Please be thinking about who might be able to assist you in the pool should you need to participate in an aquatic home exercise program at the time of discharge if you need assistance.  Some patients do not want to or do not have the ability to participate in an aquatic home program - this is not a barrier in any way to you participating in aquatic therapy as part of your current therapy plan! ?After Discharge from PT, you can continue using home program at  the [facility]/, there is a drop-in fee for $5 ($45 a month)or for 60 years  or older $4.00 ($40 a month for seniors ) or any local [employer] pool.  Memberships for purchase are  available for gym/pool at [facility] ? ?IT IS VERY IMPORTANT THAT YOUR LAST VISIT BE IN THE CLINIC AT [address] AFTER YOUR LAST AQUATIC VISIT.  PLEASE MAKE SURE THAT YOU HAVE A [address]  APPOINTMENT SCHEDULED. ? ? ?About the pool: ?Pool is located approximately 500 FT from the entrance of the building.  ?Please bring a support person if you need assistance traveling this      distance.  ? ?Your therapist will assist you in entering the water; there are two ways to     ?      enter: stairs with railings, and a mechanical lift. Your therapist will determine the most appropriate way for you. ? ?Water temperature is usually between 88-90 degrees ? ?There may be up to 2 other swimmers in the pool at the same time ? ?The pool deck is tile, please wear shoes with good traction if you prefer not to be barefoot.  ? ? ?Contact Info:  ?For appointment scheduling and cancellations:         Please call the [facility]  PH:[phone]              Aquatic Therapy  ?[facility]       All sessions are 45 minutes ? ? ? ? ?        ? ?                ?                      ?

## 2023-04-23 NOTE — Therapy (Signed)
OUTPATIENT PHYSICAL THERAPY TREATMENT NOTE   Patient Name: Jonathan [name] MRN: [id number] DOB:05-Sep-1955, 67 y.o., male Today's Date: 04/23/2023  END OF SESSION:  PT End of Session - 04/23/23 0956     Visit Number 2    Number of Visits 12    Date for PT Re-Evaluation 05/20/23    Authorization Type [employer] MCR    PT Start Time 1000    PT Stop Time 1040    PT Time Calculation (min) 40 min    Activity Tolerance Patient tolerated treatment well    Behavior During Therapy WFL for tasks assessed/performed              Past Medical History:  Diagnosis Date   Allergy    Anxiety    Blood transfusion without reported diagnosis    as baby   Chronic Bil Foot Pain 05/27/2011   Lt > Rt - since age 56 2/2 to post polio syndrome  Corns and callosities on both feet  Reconstruction surgery at age 62. Uses a brace on left leg for many years On prn tylenol for pain  No candidate for opiates due to active substance abuse Podiatry and PT (for brace eval) on 04/23/2013     Chronic Bil Foot Pain 05/27/2011   Lt > Rt - since age 73 2/2 to post polio syndrome  Corns and callosities on both feet  Reconstruction surgery at age 28. Uses a brace on left leg for many years On prn tylenol for pain  No candidate for opiates due to active substance abuse Podiatry and PT (for brace eval) on 04/23/2013    Chronic viral hepatitis C (HCC) 06/05/2006   History of blood transfusion in childhood Dx 2010 Reports history of Rx while in [place], unknown yr Elevated LFTs since 2010 and repeat 2014 U/S 2009 - no cirrhosis, repeat in 12/2013 >> normal.  Fibrosure >> pending results  Referred to Hep C clinic 04/23/2013 >> appt in 06/2013 Hep C clinic [id number] Immunity to hep A per labs at Hep C clinic  Started on Hep B series 1 st dose 02/03/2014, 2 dose given 04/23/2014. Last dose about 09/2014.  Follow with Hep C clinic        ERECTILE DYSFUNCTION 06/05/2006   Qualifier: Diagnosis of  By: Janee Morn MD, Daniel     Essential  hypertension 06/05/2006   Dx in 2008  Has BP machine at home  Checks erratically  Chronic no show issues       GERD (gastroesophageal reflux disease)    occ s/s   Hypertension    Polysubstance abuse (HCC) 04/23/2013   Cocaine (sniffs powder) and marijuana No IVDU    POST-POLIO SYNDROME 06/05/2006   Hx of polio at age 42 Has spasms in his legs on and off Used Flexeril on and off for spasms 2015 May >> sent to rehab/PT. rec new leg braces/ortho     Preventative health care 05/27/2011   Colonoscopy July 2019 polyps --> rec'd f/u in 5 years   Past Surgical History:  Procedure Laterality Date   COLONOSCOPY     FRACTURE SURGERY     KNEE ARTHROSCOPY Right    Patient Active Problem List   Diagnosis Date Noted   Aortic atherosclerosis (HCC) 04/22/2023   Adrenal nodule (HCC) 04/22/2023   Healthcare maintenance 03/26/2023   Alcohol use 03/26/2023   Weight loss 03/26/2023   Blurry vision, bilateral 02/04/2023   Prostate cancer screening 10/08/2022   Prediabetes 10/08/2022   Stage 3a  chronic kidney disease (HCC) 10/08/2022   Hyperlipidemia 10/08/2022   Muscle tension pain 05/22/2022   Gastroesophageal reflux disease 04/19/2021   Tobacco use disorder 06/23/2020   Hammer toe of left foot 11/19/2019   Tubular adenoma 01/22/2019   Spinal stenosis 12/16/2015   POST-POLIO SYNDROME 06/05/2006   Essential hypertension 06/05/2006    PCP: Dickie La MD   REFERRING PROVIDER: Edwin Cap, DPM  REFERRING DIAG: [phone] (ICD-10-CM) - Anterior tibialis tendinitis, right M62.461 (ICD-10-CM) - Gastrocnemius equinus of right lower extremity  THERAPY DIAG:  Pain in right ankle and joints of right foot  Stiffness of right ankle, not elsewhere classified  Difficulty in walking, not elsewhere classified  Rationale for Evaluation and Treatment: Rehabilitation  ONSET DATE: 1 month ago   SUBJECTIVE:   SUBJECTIVE STATEMENT: Patient reports HEP compliance, and that he may have over done it.  He states that he continues to have some mild pain in his Rt ankle/foot, that he mostly attributes to the weather.   PERTINENT HISTORY: Continues to have paresthesias and neuropathic pain in lower extremities, previously evaluated and felt secondary to lumbar stenosis > post-polio syndrome. Mr. Bianca reports good functional ability and has a cane today to help with mobility. He is interested in attending water aerobics for further strengthening and mobility. I have encouraged him to discuss Silver Sneakers with his insurance and the [employer] in order to start classes. PAIN:  Are you having pain? Yes: NPRS scale: minimal, 3/10 Pain location: Rt foot  Pain description: tender, sore  Aggravating factors: standing, walking  Relieving factors: boot, rest  PRECAUTIONS: Fall/balance due to LLE weakness   RED FLAGS: None   WEIGHT BEARING RESTRICTIONS: No  FALLS:  Has patient fallen in last 6 months? NoDoes stumbles quite a bit.   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   PLOF: Independent with basic ADLs, Independent with household mobility without device, Independent with community mobility with device, Needs assistance with ADLs, and Leisure: assist for lower body, shoes   PATIENT GOALS: I want to get this pain out of it   NEXT MD VISIT: after PT   OBJECTIVE:   DIAGNOSTIC FINDINGS: Radiographs: Multiple views x-ray of the right foot: no fracture, dislocation, swelling or degenerative changes noted  PATIENT SURVEYS:  FOTO 51%  COGNITION: Overall cognitive status: Within functional limits for tasks assessed     SENSATION: WFL does note some occasional tingling/numbness in feet, mild   EDEMA:  NT , none noted   MUSCLE LENGTH: Hamstrings: tight   Thomas test:NT   POSTURE: rounded shoulders, posterior pelvic tilt, and flexed trunk  Foot : pes equinus , Rt toes hyperextended and hallux valgus.  Lt. Foot affected similarly with decreased toe motion  PALPATION: Sore, tender along dorsum of foot from Gr toe up along the line of anterior tibialis.  He reports hypersensitivity and pain when it first began.   LOWER EXTREMITY ROM:  Active ROM Right eval Left eval  Hip flexion WNL WNL   Hip extension    Hip abduction    Hip adduction    Hip internal rotation WNL WNL   Hip external rotation WNL WNL   Knee flexion WNL  WNL   Knee extension WFL   Ankle dorsiflexion -9 active  -8 passively  Ankle plantarflexion 45-50 deg    Ankle inversion 40   Ankle eversion 20    (Blank rows = not tested)  LOWER EXTREMITY MMT:  MMT Right eval Left eval  Hip flexion 4+ 4-  Hip extension    Hip abduction    Hip adduction    Hip internal rotation    Hip external rotation    Knee flexion 5 4+  Knee extension 5 4+  Ankle dorsiflexion 5 3-  Ankle plantarflexion 4 3  Ankle inversion 4 4-  Ankle eversion 4 4-   (Blank rows = not tested)  LOWER EXTREMITY SPECIAL TESTS:  NT   FUNCTIONAL TESTS:  5 times sit to stand: 23 sec   GAIT: Distance walked: 100 Assistive device utilized: Single point cane Level of assistance: Modified independence Comments: stumbled x 1     04/23/23 0001  Balance  Balance Assessed Yes  Standardized Balance Assessment  Standardized Balance Assessment Berg Balance Test  Berg Balance Test  Sit to Stand 4  Standing Unsupported 4  Sitting with Back Unsupported but Feet Supported on Floor or Stool 4  Stand to Sit 3  Transfers 4  Standing Unsupported with Eyes Closed 3  Standing Unsupported with Feet Together 3  From Standing, Reach Forward with Outstretched Arm 4  From Standing Position, Pick up Object from Floor 4  From Standing Position, Turn to Look Behind Over each Shoulder 3  Turn 360 Degrees 2   Standing Unsupported, Alternately Place Feet on Step/Stool 1  Standing Unsupported, One Foot in Front 0  Standing on One Leg 1  Total Score 40    TODAY'S TREATMENT:       [facility] Adult PT Treatment:                                                DATE: 04/23/23 Therapeutic Exercise: Standing heel raises 2x10 Standing toe raises x10 (unable on Lt) Standing hip abduction/extension x10 ea BIL Standing marching single UE support 2x30" Slant board gastroc stretch x1' Neuromuscular re-ed: FT stance on foam x30" Tandem stance x15" BIL occasional fingertip support FT on foam head turns/nods, EC x30" each Therapeutic Activity: 258 ft BERG 40/56                                                                                                                          DATE: 04/08/23  Therapeutic Exercise: Calf and gastroc stretching, ankle DF red and INV red x 15  Self care: POC, HEP  PATIENT EDUCATION:  Education details: POC, HEP, balance and weakness  Person educated: Patient Education method: Explanation, Demonstration, Tactile cues, Verbal cues, and Handouts Education comprehension: verbalized understanding and needs further education  HOME EXERCISE PROGRAM: Access Code: [email or web address] URL: [email or web address] Date: 04/08/2023 Prepared by: Karie Mainland  Exercises - Seated Calf Stretch with Strap  -  1 x daily - 7 x weekly - 1 sets - 10 reps - 30 hold - Seated Ankle Dorsiflexion with Resistance  - 1 x daily - 7 x weekly - 2 sets - 10 reps - 5 hold - Seated Figure 4 Ankle Inversion with Resistance  - 1 x daily - 7 x weekly - 2 sets - 10 reps - 5 hold - Gastroc Stretch on Wall  - 1 x daily - 7 x weekly - 1 sets - 5 reps - 30 hold  ASSESSMENT:  CLINICAL IMPRESSION: Patient presents to PT reporting HEP compliance and that he may have done his exercises too much at home. Administered and BERG balance screen today which both indicate he is functioning with an increased  fall risk. Session today initiated LE strengthening as able. Patient was able to tolerate all prescribed exercises with no adverse effects. Patient continues to benefit from skilled PT services and should be progressed as able to improve functional independence.    OBJECTIVE IMPAIRMENTS: Abnormal gait, decreased balance, decreased mobility, difficulty walking, decreased ROM, decreased strength, increased fascial restrictions, impaired sensation, postural dysfunction, and pain.   ACTIVITY LIMITATIONS: lifting, standing, squatting, stairs, transfers, and locomotion level  PARTICIPATION LIMITATIONS: interpersonal relationship, driving, shopping, community activity, and occupation  PERSONAL FACTORS: Past/current experiences and 1-2 comorbidities: Previous foot surgery x 2, left ankle foot deformity and weakness  are also affecting patient's functional outcome.   REHAB POTENTIAL: Excellent  CLINICAL DECISION MAKING: Evolving/moderate complexity  EVALUATION COMPLEXITY: Moderate   GOALS: Goals reviewed with patient? Yes  SHORT TERM GOALS:  2 weeks (04/22/23)     Patient will be independent with basic home program for right lower extremity range of motion and strength Baseline: Given on evaluation Goal status: Ongoing  2.  Patient will complete balance screening to assess fall risk Baseline: Unknown Goal status: BERG performed 04/23/23  3.  Pt will complete 2-6 min walk test and set goal  Baseline: Unknown  Goal status: performed 04/23/23   LONG TERM GOALS: Target date: 05/20/2023    Pt will be I with HEP for long term health and mobility (including aquatics, if able)  Baseline: unknown, would like to return to aqua fitness  Goal status: INITIAL  2.  Pt will improve FOTO score to 64% or better to demo improved functional mobility  Baseline: 48% Goal status: INITIAL  3.  Pt will be able to walk 300 feet in the community with cane without increased foot pain  Baseline:  limps, boot intermittently and pain is min to mod  Goal status: INITIAL  4.  Pt will return to driving without the boot ([employer]) without increased pain  Baseline: has limited this a great deal  Goal status: INITIAL  5.  Pt will be able to demo Rt ankle DF to +5 deg for better gait and stair negotiation  Baseline: lacks 9 deg  Goal status: INITIAL  6.  Functional testing, balance goals TBA based on assessment  Baseline: NT  Goal status: INITIAL   PLAN:  PT FREQUENCY: 2x/week  PT DURATION: 6 weeks  PLANNED INTERVENTIONS: Therapeutic exercises, Therapeutic activity, Neuromuscular re-education, Balance training, Gait training, Patient/Family education, Self Care, Joint mobilization, Aquatic Therapy, Cryotherapy, Moist heat, Manual therapy, and Re-evaluation  PLAN FOR NEXT SESSION: check HEP, Nustep, 2 or 6 min walks test, balance    Berta Minor, PTA 04/23/2023, 10:44 AM   Date of referral: 03/07/23 Referring provider: Renda Rolls MD  Referring diagnosis? V42.595 (ICD-10-CM) -  Anterior tibialis tendinitis, right M62.461 (ICD-10-CM) - Gastrocnemius equinus of right lower extremity Treatment diagnosis? (if different than referring diagnosis) ankle pain, ankle stiffness Rt , difficulty walking   What was this (referring dx) caused by? Work-related and Other: structural issue, deformity   Nature of Condition: Initial Onset (within last 3 months)   Laterality: Rt  Current Functional Measure Score: FOTO 48%  Objective measurements identify impairments when they are compared to normal values, the uninvolved extremity, and prior level of function.  [x]  Yes  []  No  Objective assessment of functional ability: Moderate functional limitations   Briefly describe symptoms: Pain, stiffness and LE weakness   How did symptoms start: overuse with driving compared with chronic joint issues and compensation  Average pain intensity:  Last 24 hours: min to mod  (2/10-5/10)  Past week: mod (5/10)  How often does the pt experience symptoms? Frequently  How much have the symptoms interfered with usual daily activities? Moderately  How has condition changed since care began at this facility? NA - initial visit  In general, how is the patients overall health? Leata Mouse PTA 04/23/23 10:44 AM Phone: [phone] Fax: [phone]

## 2023-04-24 ENCOUNTER — Ambulatory Visit: Payer: Self-pay

## 2023-04-24 NOTE — Patient Instructions (Signed)
Visit Information  Thank you for taking time to visit with me today. Please don't hesitate to contact me if I can be of assistance to you.   Following are the goals we discussed today:   Goals Addressed             This Visit's Progress    I need to quit smoking       Patient Self Care Activities:  Patient will monitor as a habit during cravings  Patient will commit to reducing tobacco consumption Tobacco abuse of 48 years; currently smoking 1/2 ppd Reports smoking within 30 minutes of waking up Reports triggers to smoke include: N?A Reports motivation to quit smoking includes: health reasons On a scale of 1-10, reports MOTIVATION to quit is 5/10 On a scale of 1-10, reports CONFIDENCE in quitting is 8/10 Provided contact information for [place] [facility] (1-800-QUIT-NOW). Patient will outreach this group for support. Discussed plans with patient for ongoing care management follow up and provided patient with direct contact information for care management team Provided patient with smoking cessation educational materials via mail Reviewed scheduled/upcoming provider appointments including Referred patient to pharmacy team Provided contact information for [place] [facility] (1-800-QUIT-NOW). Objective:  Last practice recorded BP readings:  BP Readings from Last 3 Encounters:  03/26/23 112/71  02/04/23 131/88  02/04/23 131/88   Most recent eGFR/CrCl:  Lab Results  Component Value Date   EGFR 60 03/26/2023    No components found for: "CRCL" Evaluation of current treatment plan related to hypertension self management and patient's adherence to plan as established by provider. Provided education to patient re: stroke prevention, s/s of heart attack and stroke, DASH diet, complications of uncontrolled blood pressure Advised patient, providing education and rationale, to monitor blood pressure daily and record, calling PCP for findings outside established parameters.           Our next  appointment is by telephone on 05/28/23 at 1100 am  Please call the care guide team at [phone] if you need to cancel or reschedule your appointment.   If you are experiencing a Mental Health or Behavioral Health Crisis or need someone to talk to, please call 1-800-273-TALK (toll free, 24 hour hotline)  Patient verbalizes understanding of instructions and care plan provided today and agrees to view in MyChart. Active MyChart status and patient understanding of how to access instructions and care plan via MyChart confirmed with patient.     Juanell Fairly RN, BSN, [facility] [employer] Phone: [phone]

## 2023-04-24 NOTE — Patient Outreach (Signed)
Care Coordination   Follow Up Visit Note   04/24/2023 Name: Jonathan [name] MRN: [id number] DOB: 1956-05-10  Jonathan [name] is a 67 y.o. year old male who sees Jonathan La, MD for primary care. I spoke with  Jonathan [name] by phone today.  What matters to the patients health and wellness today?  Jonathan [name] and I discussed his progress in quitting smoking. He currently smokes ten cigarettes a day, but he acknowledges the need to further reduce his intake. We talked about the absence of programs to assist with purchasing smoking cessation medications, but I mentioned that these can be bought over the counter. He plans to explore over-the-counter medications. He reported stable blood pressure and denied experiencing headaches or chest pain. He's been taking his prescribed medications and avoiding salty foods and drinking water, but he mentioned the need for more exercise.    Goals Addressed             This Visit's Progress    I need to quit smoking       Patient Self Care Activities:  Patient will monitor as a habit during cravings  Patient will commit to reducing tobacco consumption Tobacco abuse of 48 years; currently smoking 1/2 ppd Reports smoking within 30 minutes of waking up Reports triggers to smoke include: N?A Reports motivation to quit smoking includes: health reasons On a scale of 1-10, reports MOTIVATION to quit is 5/10 On a scale of 1-10, reports CONFIDENCE in quitting is 8/10 Provided contact information for [place] [facility] (1-800-QUIT-NOW). Patient will outreach this group for support. Discussed plans with patient for ongoing care management follow up and provided patient with direct contact information for care management team Provided patient with smoking cessation educational materials via mail Reviewed scheduled/upcoming provider appointments including Referred patient to pharmacy team Provided contact information for [place] [facility] (1-800-QUIT-NOW). Objective:   Last practice recorded BP readings:  BP Readings from Last 3 Encounters:  03/26/23 112/71  02/04/23 131/88  02/04/23 131/88   Most recent eGFR/CrCl:  Lab Results  Component Value Date   EGFR 60 03/26/2023    No components found for: "CRCL" Evaluation of current treatment plan related to hypertension self management and patient's adherence to plan as established by provider. Provided education to patient re: stroke prevention, s/s of heart attack and stroke, DASH diet, complications of uncontrolled blood pressure Advised patient, providing education and rationale, to monitor blood pressure daily and record, calling PCP for findings outside established parameters.           SDOH assessments and interventions completed:  No     Care Coordination Interventions:  Yes, provided   Interventions Today    Flowsheet Row Most Recent Value  Chronic Disease   Chronic disease during today's visit Hypertension (HTN)  [Smoking cessation]  General Interventions   General Interventions Discussed/Reviewed General Interventions Discussed  Exercise Interventions   Exercise Discussed/Reviewed Exercise Discussed  [Try to exercise 15 minutes a day, you can break them up into 5 minute increments]  Nutrition Interventions   Nutrition Discussed/Reviewed Decreasing salt, Fluid intake  Pharmacy Interventions   Pharmacy Dicussed/Reviewed Pharmacy Topics Discussed  Safety Interventions   Safety Discussed/Reviewed Safety Discussed        Follow up plan: Follow up call scheduled for 05/28/23  1100 am    Encounter Outcome:  Patient Visit Completed   Jonathan Fairly RN, BSN, [facility]   Care Coordinator Phone: [phone]

## 2023-04-25 ENCOUNTER — Ambulatory Visit: Payer: Medicare Other | Admitting: Physical Therapy

## 2023-04-25 ENCOUNTER — Telehealth: Payer: Self-pay | Admitting: Internal Medicine

## 2023-04-25 DIAGNOSIS — E278 Other specified disorders of adrenal gland: Secondary | ICD-10-CM

## 2023-04-25 MED ORDER — DEXAMETHASONE 1 MG PO TABS
ORAL_TABLET | ORAL | 0 refills | Status: DC
Start: 2023-04-25 — End: 2023-06-25

## 2023-04-25 NOTE — Telephone Encounter (Signed)
Called to review lab work for adrenal incidentaloma evaluation with Jonathan [name]. Plan and timing for dexamethasone suppression test reviewed. He will call to schedule a day for lab work next week after picking up the medication.

## 2023-04-25 NOTE — Addendum Note (Signed)
Addended by: Dickie La on: 04/25/2023 10:39 AM   Modules accepted: Orders

## 2023-04-26 ENCOUNTER — Ambulatory Visit: Payer: Medicare Other

## 2023-04-26 DIAGNOSIS — M25671 Stiffness of right ankle, not elsewhere classified: Secondary | ICD-10-CM | POA: Diagnosis not present

## 2023-04-26 DIAGNOSIS — R262 Difficulty in walking, not elsewhere classified: Secondary | ICD-10-CM

## 2023-04-26 DIAGNOSIS — M25571 Pain in right ankle and joints of right foot: Secondary | ICD-10-CM | POA: Diagnosis not present

## 2023-04-26 NOTE — Therapy (Signed)
OUTPATIENT PHYSICAL THERAPY TREATMENT NOTE   Patient Name: Jonathan [name] MRN: [id number] DOB:[birth year], 67 y.o., male Today's Date: 04/26/2023  END OF SESSION:  PT End of Session - 04/26/23 1419     Visit Number 3    Number of Visits 12    Date for PT Re-Evaluation 05/20/23    Authorization Type [employer] MCR    PT Start Time 1426    PT Stop Time 1512    PT Time Calculation (min) 46 min    Activity Tolerance Patient tolerated treatment well    Behavior During Therapy WFL for tasks assessed/performed               Past Medical History:  Diagnosis Date   Allergy    Anxiety    Blood transfusion without reported diagnosis    as baby   Chronic Bil Foot Pain 05/27/2011   Lt > Rt - since age 63 2/2 to post polio syndrome  Corns and callosities on both feet  Reconstruction surgery at age 50. Uses a brace on left leg for many years On prn tylenol for pain  No candidate for opiates due to active substance abuse Podiatry and PT (for brace eval) on 04/23/2013     Chronic Bil Foot Pain 05/27/2011   Lt > Rt - since age 33 2/2 to post polio syndrome  Corns and callosities on both feet  Reconstruction surgery at age 60. Uses a brace on left leg for many years On prn tylenol for pain  No candidate for opiates due to active substance abuse Podiatry and PT (for brace eval) on 04/23/2013    Chronic viral hepatitis C (HCC) 06/05/2006   History of blood transfusion in childhood Dx 2010 Reports history of Rx while in [place], unknown yr Elevated LFTs since 2010 and repeat 2014 U/S 2009 - no cirrhosis, repeat in 12/2013 >> normal.  Fibrosure >> pending results  Referred to Hep C clinic 04/23/2013 >> appt in 06/2013 Hep C clinic [phone] Immunity to hep A per labs at Hep C clinic  Started on Hep B series 1 st dose 02/03/2014, 2 dose given 04/23/2014. Last dose about 09/2014.  Follow with Hep C clinic        ERECTILE DYSFUNCTION 06/05/2006   Qualifier: Diagnosis of  By: Janee Morn MD, Daniel     Essential  hypertension 06/05/2006   Dx in 2008  Has BP machine at home  Checks erratically  Chronic no show issues       GERD (gastroesophageal reflux disease)    occ s/s   Hypertension    Polysubstance abuse (HCC) 04/23/2013   Cocaine (sniffs powder) and marijuana No IVDU    POST-POLIO SYNDROME 06/05/2006   Hx of polio at age 73 Has spasms in his legs on and off Used Flexeril on and off for spasms 2015 May >> sent to rehab/PT. rec new leg braces/ortho     Preventative health care 05/27/2011   Colonoscopy July 2019 polyps --> rec'd f/u in 5 years   Past Surgical History:  Procedure Laterality Date   COLONOSCOPY     FRACTURE SURGERY     KNEE ARTHROSCOPY Right    Patient Active Problem List   Diagnosis Date Noted   Aortic atherosclerosis (HCC) 04/22/2023   Adrenal nodule (HCC) 04/22/2023   Healthcare maintenance 03/26/2023   Alcohol use 03/26/2023   Weight loss 03/26/2023   Blurry vision, bilateral 02/04/2023   Prostate cancer screening 10/08/2022   Prediabetes 10/08/2022   Stage  3a chronic kidney disease (HCC) 10/08/2022   Hyperlipidemia 10/08/2022   Muscle tension pain 05/22/2022   Gastroesophageal reflux disease 04/19/2021   Tobacco use disorder 06/23/2020   Hammer toe of left foot 11/19/2019   Tubular adenoma 01/22/2019   Spinal stenosis 12/16/2015   POST-POLIO SYNDROME 06/05/2006   Essential hypertension 06/05/2006    PCP: Dickie La MD   REFERRING PROVIDER: Edwin Cap, DPM  REFERRING DIAG: [phone] (ICD-10-CM) - Anterior tibialis tendinitis, right M62.461 (ICD-10-CM) - Gastrocnemius equinus of right lower extremity  THERAPY DIAG:  Stiffness of right ankle, not elsewhere classified  Pain in right ankle and joints of right foot  Difficulty in walking, not elsewhere classified  Rationale for Evaluation and Treatment: Rehabilitation  ONSET DATE: 1 month ago   SUBJECTIVE:   SUBJECTIVE STATEMENT: Patient reports continued Rt ankle pain, is excited to begin aquatic  therapy.   PERTINENT HISTORY: Continues to have paresthesias and neuropathic pain in lower extremities, previously evaluated and felt secondary to lumbar stenosis > post-polio syndrome. Mr. Kosten reports good functional ability and has a cane today to help with mobility. He is interested in attending water aerobics for further strengthening and mobility. I have encouraged him to discuss Silver Sneakers with his insurance and the [employer] in order to start classes. PAIN:  Are you having pain? Yes: NPRS scale: minimal, 3/10 Pain location: Rt foot  Pain description: tender, sore  Aggravating factors: standing, walking  Relieving factors: boot, rest  PRECAUTIONS: Fall/balance due to LLE weakness   RED FLAGS: None   WEIGHT BEARING RESTRICTIONS: No  FALLS:  Has patient fallen in last 6 months? NoDoes stumbles quite a bit.   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   PLOF: Independent with basic ADLs, Independent with household mobility without device, Independent with community mobility with device, Needs assistance with ADLs, and Leisure: assist for lower body, shoes   PATIENT GOALS: I want to get this pain out of it   NEXT MD VISIT: after PT   OBJECTIVE:   DIAGNOSTIC FINDINGS: Radiographs: Multiple views x-ray of the right foot: no fracture, dislocation, swelling or degenerative changes noted  PATIENT SURVEYS:  FOTO 51%  COGNITION: Overall cognitive status: Within functional limits for tasks assessed     SENSATION: WFL does note some occasional tingling/numbness in feet, mild   EDEMA:  NT , none noted   MUSCLE LENGTH: Hamstrings: tight  Thomas test:NT   POSTURE: rounded shoulders, posterior pelvic tilt, and flexed trunk  Foot : pes equinus ,  Rt toes hyperextended and hallux valgus.  Lt. Foot affected similarly with decreased toe motion  PALPATION: Sore, tender along dorsum of foot from Gr toe up along the line of anterior tibialis.  He reports hypersensitivity and pain when it first began.   LOWER EXTREMITY ROM:  Active ROM Right eval Left eval  Hip flexion WNL WNL   Hip extension    Hip abduction    Hip adduction    Hip internal rotation WNL WNL   Hip external rotation WNL WNL   Knee flexion WNL  WNL   Knee extension WFL   Ankle dorsiflexion -9 active  -8 passively   Ankle plantarflexion 45-50 deg    Ankle inversion 40   Ankle eversion 20    (Blank rows =  not tested)  LOWER EXTREMITY MMT:  MMT Right eval Left eval  Hip flexion 4+ 4-  Hip extension    Hip abduction    Hip adduction    Hip internal rotation    Hip external rotation    Knee flexion 5 4+  Knee extension 5 4+  Ankle dorsiflexion 5 3-  Ankle plantarflexion 4 3  Ankle inversion 4 4-  Ankle eversion 4 4-   (Blank rows = not tested)  LOWER EXTREMITY SPECIAL TESTS:  NT   FUNCTIONAL TESTS:  5 times sit to stand: 23 sec   GAIT: Distance walked: 100 Assistive device utilized: Single point cane Level of assistance: Modified independence Comments: stumbled x 1     04/23/23 0001  Balance  Balance Assessed Yes  Standardized Balance Assessment  Standardized Balance Assessment Berg Balance Test  Berg Balance Test  Sit to Stand 4  Standing Unsupported 4  Sitting with Back Unsupported but Feet Supported on Floor or Stool 4  Stand to Sit 3  Transfers 4  Standing Unsupported with Eyes Closed 3  Standing Unsupported with Feet Together 3  From Standing, Reach Forward with Outstretched Arm 4  From Standing Position, Pick up Object from Floor 4  From Standing Position, Turn to Look Behind Over each Shoulder 3  Turn 360 Degrees 2  Standing Unsupported, Alternately Place Feet on Step/Stool 1  Standing Unsupported, One Foot in Front 0   Standing on One Leg 1  Total Score 40    TODAY'S TREATMENT:       [facility] Adult PT Treatment:                                                DATE: 04/26/23 Aquatic therapy at [facility]- [address] - therapeutic pool temp approximately 91 degrees. Pt enters building ambulating independently with SPC. Treatment took place in water 3.8 to  4 ft 8 in. deep depending upon activity.  Pt entered and exited the pool via stair and handrails independently. Patient entered water for aquatic therapy for first time and was introduced to principles and therapeutic effects of water as they ambulated and acclimated to pool.  Aquatic Exercise: Walking forward/backwards/side stepping Lunge stepping forwards x1 lap Side stepping with rainbow DB shoulder abd/add x2 laps Tandem stance  x30" BIL Step ups bottom step fwd/lat x10 ea BIL Standing with UE support edge of pool: Hip abd/add x20 BIL Heel raises 2x20 Hip ext/flex with knee straight x 20 BIL Hip Circles CC/CCW x10 each BIL Marching hip flexion to knee extension 2x10 BIL Squats 2x20  Pt requires the buoyancy of water for active assisted exercises with buoyancy supported for strengthening and AROM exercises. Hydrostatic pressure also supports joints by unweighting joint load by at least 50 % in 3-4 feet depth water. 80% in chest to neck deep water. Water will provide assistance with movement using the current and laminar flow while the buoyancy reduces weight bearing. Pt requires the viscosity of the water for resistance with strengthening exercises.    [facility] Adult PT Treatment:                                                DATE: 04/23/23 Therapeutic Exercise: Standing  heel raises 2x10 Standing toe raises x10 (unable on Lt) Standing hip abduction/extension x10 ea BIL Standing marching single UE support 2x30" Slant board gastroc stretch x1' Neuromuscular re-ed: FT stance on foam x30" Tandem stance x15" BIL occasional fingertip support FT  on foam head turns/nods, EC x30" each Therapeutic Activity: 258 ft BERG 40/56                                                                                                                          DATE: 04/08/23  Therapeutic Exercise: Calf and gastroc stretching, ankle DF red and INV red x 15  Self care: POC, HEP  PATIENT EDUCATION:  Education details: POC, HEP, balance and weakness  Person educated: Patient Education method: Explanation, Demonstration, Tactile cues, Verbal cues, and Handouts Education comprehension: verbalized understanding and needs further education  HOME EXERCISE PROGRAM: Access Code: [email or web address] URL: [email or web address] Date: 04/08/2023 Prepared by: Karie Mainland  Exercises - Seated Calf Stretch with Strap  - 1 x daily - 7 x weekly - 1 sets - 10 reps - 30 hold - Seated Ankle Dorsiflexion with Resistance  - 1 x daily - 7 x weekly - 2 sets - 10 reps - 5 hold - Seated Figure 4 Ankle Inversion with Resistance  - 1 x daily - 7 x weekly - 2 sets - 10 reps - 5 hold - Gastroc Stretch on Wall  - 1 x daily - 7 x weekly - 1 sets - 5 reps - 30 hold  ASSESSMENT:  CLINICAL IMPRESSION: Patient presents to first aquatic PT session reporting continued Rt ankle pain. Session today focused on LE strengthening and balance tasks in the aquatic environment for use of buoyancy to offload joints and the viscosity of water as resistance during therapeutic exercise. Patient was able to tolerate all prescribed exercises in the aquatic environment with no adverse effects. Patient continues to benefit from skilled PT services on land and aquatic based and should be progressed as able to improve functional independence.    OBJECTIVE IMPAIRMENTS: Abnormal gait, decreased balance, decreased mobility, difficulty walking, decreased ROM, decreased strength, increased fascial restrictions, impaired sensation, postural dysfunction, and pain.   ACTIVITY LIMITATIONS: lifting,  standing, squatting, stairs, transfers, and locomotion level  PARTICIPATION LIMITATIONS: interpersonal relationship, driving, shopping, community activity, and occupation  PERSONAL FACTORS: Past/current experiences and 1-2 comorbidities: Previous foot surgery x 2, left ankle foot deformity and weakness  are also affecting patient's functional outcome.   REHAB POTENTIAL: Excellent  CLINICAL DECISION MAKING: Evolving/moderate complexity  EVALUATION COMPLEXITY: Moderate   GOALS: Goals reviewed with patient? Yes  SHORT TERM GOALS:  2 weeks (04/22/23)     Patient will be independent with basic home program for right lower extremity range of motion and strength Baseline: Given on evaluation Goal status: Ongoing  2.  Patient will complete balance screening to assess fall risk Baseline: Unknown Goal status: BERG performed 04/23/23  3.  Pt will complete 2-6 min walk test and set goal  Baseline: Unknown  Goal status: performed 04/23/23   LONG TERM GOALS: Target date: 05/20/2023    Pt will be I with HEP for long term health and mobility (including aquatics, if able)  Baseline: unknown, would like to return to aqua fitness  Goal status: INITIAL  2.  Pt will improve FOTO score to 64% or better to demo improved functional mobility  Baseline: 48% Goal status: INITIAL  3.  Pt will be able to walk 300 feet in the community with cane without increased foot pain  Baseline: limps, boot intermittently and pain is min to mod  Goal status: INITIAL  4.  Pt will return to driving without the boot ([employer]) without increased pain  Baseline: has limited this a great deal  Goal status: INITIAL  5.  Pt will be able to demo Rt ankle DF to +5 deg for better gait and stair negotiation  Baseline: lacks 9 deg  Goal status: INITIAL  6.  Functional testing, balance goals TBA based on assessment  Baseline: NT  Goal status: INITIAL   PLAN:  PT FREQUENCY: 2x/week  PT DURATION: 6  weeks  PLANNED INTERVENTIONS: Therapeutic exercises, Therapeutic activity, Neuromuscular re-education, Balance training, Gait training, Patient/Family education, Self Care, Joint mobilization, Aquatic Therapy, Cryotherapy, Moist heat, Manual therapy, and Re-evaluation  PLAN FOR NEXT SESSION: check HEP, Nustep, 2 or 6 min walks test, balance    Berta Minor, PTA 04/26/2023, 3:13 PM   Date of referral: 03/07/23 Referring provider: Renda Rolls MD  Referring diagnosis? M76.811 (ICD-10-CM) - Anterior tibialis tendinitis, right M62.461 (ICD-10-CM) - Gastrocnemius equinus of right lower extremity Treatment diagnosis? (if different than referring diagnosis) ankle pain, ankle stiffness Rt , difficulty walking   What was this (referring dx) caused by? Work-related and Other: structural issue, deformity   Nature of Condition: Initial Onset (within last 3 months)   Laterality: Rt  Current Functional Measure Score: FOTO 48%  Objective measurements identify impairments when they are compared to normal values, the uninvolved extremity, and prior level of function.  [x]  Yes  []  No  Objective assessment of functional ability: Moderate functional limitations   Briefly describe symptoms: Pain, stiffness and LE weakness   How did symptoms start: overuse with driving compared with chronic joint issues and compensation  Average pain intensity:  Last 24 hours: min to mod (2/10-5/10)  Past week: mod (5/10)  How often does the pt experience symptoms? Frequently  How much have the symptoms interfered with usual daily activities? Moderately  How has condition changed since care began at this facility? NA - initial visit  In general, how is the patients overall health? Leata Mouse PTA 04/26/23 3:13 PM Phone: [phone] Fax: [phone]

## 2023-04-29 ENCOUNTER — Other Ambulatory Visit: Payer: Medicare Other

## 2023-04-29 NOTE — Therapy (Unsigned)
OUTPATIENT PHYSICAL THERAPY TREATMENT NOTE   Patient Name: Jonathan [name] MRN: [id number] DOB:07/22/1956, 67 y.o., male Today's Date: 04/30/2023  END OF SESSION:  PT End of Session - 04/30/23 1159     Visit Number 4    Number of Visits 12    Date for PT Re-Evaluation 05/20/23    Authorization Type U[employer] MCR    PT Start Time 1155   10 min late   PT Stop Time 1231    PT Time Calculation (min) 36 min    Activity Tolerance Patient tolerated treatment well    Behavior During Therapy WFL for tasks assessed/performed                Past Medical History:  Diagnosis Date   Allergy    Anxiety    Blood transfusion without reported diagnosis    as baby   Chronic Bil Foot Pain 05/27/2011   Lt > Rt - since age 84 2/2 to post polio syndrome  Corns and callosities on both feet  Reconstruction surgery at age 59. Uses a brace on left leg for many years On prn tylenol for pain  No candidate for opiates due to active substance abuse Podiatry and PT (for brace eval) on 04/23/2013     Chronic Bil Foot Pain 05/27/2011   Lt > Rt - since age 76 2/2 to post polio syndrome  Corns and callosities on both feet  Reconstruction surgery at age 89. Uses a brace on left leg for many years On prn tylenol for pain  No candidate for opiates due to active substance abuse Podiatry and PT (for brace eval) on 04/23/2013    Chronic viral hepatitis C (HCC) 06/05/2006   History of blood transfusion in childhood Dx 2010 Reports history of Rx while in [place], unknown yr Elevated LFTs since 2010 and repeat 2014 U/S 2009 - no cirrhosis, repeat in 12/2013 >> normal.  Fibrosure >> pending results  Referred to Hep C clinic 04/23/2013 >> appt in 06/2013 Hep C clinic [phone] Immunity to hep A per labs at Hep C clinic  Started on Hep B series 1 st dose 02/03/2014, 2 dose given 04/23/2014. Last dose about 09/2014.  Follow with Hep C clinic        ERECTILE DYSFUNCTION 06/05/2006   Qualifier: Diagnosis of  By: Jonathan Morn MD, Jonathan [name]      Essential hypertension 06/05/2006   Dx in 2008  Has BP machine at home  Checks erratically  Chronic no show issues       GERD (gastroesophageal reflux disease)    occ s/s   Hypertension    Polysubstance abuse (HCC) 04/23/2013   Cocaine (sniffs powder) and marijuana No IVDU    POST-POLIO SYNDROME 06/05/2006   Hx of polio at age 78 Has spasms in his legs on and off Used Flexeril on and off for spasms 2015 May >> sent to rehab/PT. rec new leg braces/ortho     Preventative health care 05/27/2011   Colonoscopy July 2019 polyps --> rec'd f/u in 5 years   Past Surgical History:  Procedure Laterality Date   COLONOSCOPY     FRACTURE SURGERY     KNEE ARTHROSCOPY Right    Patient Active Problem List   Diagnosis Date Noted   Aortic atherosclerosis (HCC) 04/22/2023   Adrenal nodule (HCC) 04/22/2023   Healthcare maintenance 03/26/2023   Alcohol use 03/26/2023   Weight loss 03/26/2023   Blurry vision, bilateral 02/04/2023   Prostate cancer screening 10/08/2022  Prediabetes 10/08/2022   Stage 3a chronic kidney disease (HCC) 10/08/2022   Hyperlipidemia 10/08/2022   Muscle tension pain 05/22/2022   Gastroesophageal reflux disease 04/19/2021   Tobacco use disorder 06/23/2020   Hammer toe of left foot 11/19/2019   Tubular adenoma 01/22/2019   Spinal stenosis 12/16/2015   POST-POLIO SYNDROME 06/05/2006   Essential hypertension 06/05/2006    PCP: Jonathan La MD   REFERRING PROVIDER: Edwin [name], Jonathan [name]  REFERRING DIAG: [phone] (ICD-10-CM) - Anterior tibialis tendinitis, right M62.461 (ICD-10-CM) - Gastrocnemius equinus of right lower extremity  THERAPY DIAG:  Stiffness of right ankle, not elsewhere classified  Pain in right ankle and joints of right foot  Difficulty in walking, not elsewhere classified  Rationale for Evaluation and Treatment: Rehabilitation  ONSET DATE: 1 month ago   SUBJECTIVE:   SUBJECTIVE STATEMENT: Rt ankle/foot 2/10.  I loved the pool.  I am going to  join that place when I get done.  Saw the MD this AM and I am fasting.  I get cramps in LLE , foot.    PERTINENT HISTORY: Continues to have paresthesias and neuropathic pain in lower extremities, previously evaluated and felt secondary to lumbar stenosis > post-polio syndrome. Jonathan [name] reports good functional ability and has a cane today to help with mobility. He is interested in attending water aerobics for further strengthening and mobility. I have encouraged him to discuss [employer] with his insurance and the [employer] in order to start classes. PAIN:  Are you having pain? Yes: NPRS scale: minimal, 2-3/10 Pain location: Rt foot  Pain description: tender, sore  Aggravating factors: standing, walking  Relieving factors: boot, rest  PRECAUTIONS: Fall/balance due to LLE weakness   RED FLAGS: None   WEIGHT BEARING RESTRICTIONS: No  FALLS:  Has patient fallen in last 6 months? NoDoes stumbles quite a bit.   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   PLOF: Independent with basic ADLs, Independent with household mobility without device, Independent with community mobility with device, Needs assistance with ADLs, and Leisure: assist for lower body, shoes   PATIENT GOALS: I want to get this pain out of it   NEXT MD VISIT: after PT   OBJECTIVE:   DIAGNOSTIC FINDINGS: Radiographs: Multiple views x-ray of the right foot: no fracture, dislocation, swelling or degenerative changes noted  PATIENT SURVEYS:  FOTO 51%  COGNITION: Overall cognitive status: Within functional limits for tasks assessed     SENSATION: WFL does note some occasional tingling/numbness in feet, mild   EDEMA:  NT , none noted   MUSCLE LENGTH: Hamstrings: tight  Thomas test:NT    POSTURE: rounded shoulders, posterior pelvic tilt, and flexed trunk  Foot : pes equinus , Rt toes hyperextended and hallux valgus.  Lt. Foot affected similarly with decreased toe motion  PALPATION: Sore, tender along dorsum of foot from Gr toe up along the line of anterior tibialis.  He reports hypersensitivity and pain when it first began.   LOWER EXTREMITY ROM:  Active ROM Right eval Left eval  Hip flexion WNL WNL   Hip extension    Hip abduction    Hip adduction    Hip internal rotation WNL WNL   Hip external rotation WNL WNL   Knee flexion WNL  WNL   Knee extension  WFL   Ankle dorsiflexion -9 active  -8 passively   Ankle plantarflexion 45-50 deg    Ankle inversion 40   Ankle eversion 20    (Blank rows = not tested)  LOWER EXTREMITY MMT:  MMT Right eval Left eval  Hip flexion 4+ 4-  Hip extension    Hip abduction    Hip adduction    Hip internal rotation    Hip external rotation    Knee flexion 5 4+  Knee extension 5 4+  Ankle dorsiflexion 5 3-  Ankle plantarflexion 4 3  Ankle inversion 4 4-  Ankle eversion 4 4-   (Blank rows = not tested)  LOWER EXTREMITY SPECIAL TESTS:  NT   FUNCTIONAL TESTS:  5 times sit to stand: 23 sec   GAIT: Distance walked: 100 Assistive device utilized: Single point cane Level of assistance: Modified independence Comments: stumbled x 1     04/23/23 0001  Balance  Balance Assessed Yes  Standardized Balance Assessment  Standardized Balance Assessment Berg Balance Test  Berg Balance Test  Sit to Stand 4  Standing Unsupported 4  Sitting with Back Unsupported but Feet Supported on Floor or Stool 4  Stand to Sit 3  Transfers 4  Standing Unsupported with Eyes Closed 3  Standing Unsupported with Feet Together 3  From Standing, Reach Forward with Outstretched Arm 4  From Standing Position, Pick up Object from Floor 4  From Standing Position, Turn to Look Behind Over each Shoulder 3  Turn 360 Degrees 2  Standing  Unsupported, Alternately Place Feet on Step/Stool 1  Standing Unsupported, One Foot in Front 0  Standing on One Leg 1  Total Score 40    TODAY'S TREATMENT:     [facility] Adult PT Treatment:                                                DATE: 04/30/23 Therapeutic Exercise: NuStep  Seated DF green band x 15  Calf stretch 3 x 30 sec strap  Seated PF with and without wgt (15 lbs )  x 20  Calf stretch off edge of step 30 sec  Heel raise off edge of step  x 10 , able to do a bit on the Lt LE  Toe raise Rt foot x 15  SLS each LE x 3 trials, , 8 sec each side Rt LE less stable  Leg press 2 plates x 15 double leg 1 plate single leg  x 10 each    [facility] Adult PT Treatment:                                                DATE: 04/26/23 Aquatic therapy at [facility]- [address] - therapeutic pool temp approximately 91 degrees. Pt enters building ambulating independently with SPC. Treatment took place in water 3.8 to  4 ft 8 in. deep depending upon activity.  Pt entered and exited the pool via stair and handrails independently. Patient entered water for aquatic therapy for first time and was introduced to principles and therapeutic effects of water as they ambulated and acclimated to pool.  Aquatic Exercise: Walking forward/backwards/side stepping Lunge stepping forwards x1 lap Side stepping with rainbow DB shoulder abd/add  x2 laps Tandem stance  x30" BIL Step ups bottom step fwd/lat x10 ea BIL Standing with UE support edge of pool: Hip abd/add x20 BIL Heel raises 2x20 Hip ext/flex with knee straight x 20 BIL Hip Circles CC/CCW x10 each BIL Marching hip flexion to knee extension 2x10 BIL Squats 2x20  Pt requires the buoyancy of water for active assisted exercises with buoyancy supported for strengthening and AROM exercises. Hydrostatic pressure also supports joints by unweighting joint load by at least 50 % in 3-4 feet depth water. 80% in chest to neck deep water. Water will provide  assistance with movement using the current and laminar flow while the buoyancy reduces weight bearing. Pt requires the viscosity of the water for resistance with strengthening exercises.    [facility] Adult PT Treatment:                                                DATE: 04/23/23 Therapeutic Exercise: Standing heel raises 2x10 Standing toe raises x10 (unable on Lt) Standing hip abduction/extension x10 ea BIL Standing marching single UE support 2x30" Slant board gastroc stretch x1' Neuromuscular re-ed: FT stance on foam x30" Tandem stance x15" BIL occasional fingertip support FT on foam head turns/nods, EC x30" each Therapeutic Activity: 258 ft BERG 40/56                                                                                                                          DATE: 04/08/23  Therapeutic Exercise: Calf and gastroc stretching, ankle DF red and INV red x 15  Self care: POC, HEP  PATIENT EDUCATION:  Education details: POC, HEP, balance and weakness  Person educated: Patient Education method: Explanation, Demonstration, Tactile cues, Verbal cues, and Handouts Education comprehension: verbalized understanding and needs further education  HOME EXERCISE PROGRAM: Access Code: [email or web address] URL: [email or web address] Date: 04/08/2023 Prepared by: Karie Mainland  Exercises - Seated Calf Stretch with Strap  - 1 x daily - 7 x weekly - 1 sets - 10 reps - 30 hold - Seated Ankle Dorsiflexion with Resistance  - 1 x daily - 7 x weekly - 2 sets - 10 reps - 5 hold - Seated Figure 4 Ankle Inversion with Resistance  - 1 x daily - 7 x weekly - 2 sets - 10 reps - 5 hold - Gastroc Stretch on Wall  - 1 x daily - 7 x weekly - 1 sets - 5 reps - 30 hold  ASSESSMENT:  CLINICAL IMPRESSION: Patient is doing well overall, finding benefit from HEP and aquatics.  Able to do small range heel raises today without increasing pain.  Pt will benefit from skilled PT to improve gait stability.     OBJECTIVE IMPAIRMENTS: Abnormal gait, decreased balance, decreased mobility, difficulty walking, decreased ROM, decreased strength, increased fascial restrictions, impaired sensation, postural dysfunction, and pain.  ACTIVITY LIMITATIONS: lifting, standing, squatting, stairs, transfers, and locomotion level  PARTICIPATION LIMITATIONS: interpersonal relationship, driving, shopping, community activity, and occupation  PERSONAL FACTORS: Past/current experiences and 1-2 comorbidities: Previous foot surgery x 2, left ankle foot deformity and weakness  are also affecting patient's functional outcome.   REHAB POTENTIAL: Excellent  CLINICAL DECISION MAKING: Evolving/moderate complexity  EVALUATION COMPLEXITY: Moderate   GOALS: Goals reviewed with patient? Yes  SHORT TERM GOALS:  2 weeks (04/22/23)     Patient will be independent with basic home program for right lower extremity range of motion and strength Baseline: Given on evaluation Goal status: Ongoing  2.  Patient will complete balance screening to assess fall risk Baseline: Unknown Goal status: BERG performed 04/23/23  3.  Pt will complete 2-6 min walk test and set goal  Baseline: Unknown  Goal status: performed 04/23/23   LONG TERM GOALS: Target date: 05/20/2023    Pt will be I with HEP for long term health and mobility (including aquatics, if able)  Baseline: unknown, would like to return to aqua fitness  Goal status: INITIAL  2.  Pt will improve FOTO score to 64% or better to demo improved functional mobility  Baseline: 48% Goal status: INITIAL  3.  Pt will be able to walk 300 feet in the community with cane without increased foot pain  Baseline: limps, boot intermittently and pain is min to mod  Goal status: INITIAL  4.  Pt will return to driving without the boot ([employer]) without increased pain  Baseline: has limited this a great deal  Goal status: INITIAL  5.  Pt will be able to demo Rt ankle DF to  +5 deg for better gait and stair negotiation  Baseline: lacks 9 deg  Goal status: INITIAL  6.  Functional testing, balance goals TBA based on assessment  Baseline: NT  Goal status: INITIAL   PLAN:  PT FREQUENCY: 2x/week  PT DURATION: 6 weeks  PLANNED INTERVENTIONS: Therapeutic exercises, Therapeutic activity, Neuromuscular re-education, Balance training, Gait training, Patient/Family education, Self Care, Joint mobilization, Aquatic Therapy, Cryotherapy, Moist heat, Manual therapy, and Re-evaluation  PLAN FOR NEXT SESSION: check HEP, Nustep, 2 or 6 min walks test, balance    Errik Mitchelle, PT 04/30/2023, 1:23 PM  Lanell Persons PT 04/30/23 1:23 PM Phone: [phone] Fax: [phone]

## 2023-04-30 ENCOUNTER — Ambulatory Visit: Payer: Medicare Other | Admitting: Physical Therapy

## 2023-04-30 ENCOUNTER — Encounter: Payer: Self-pay | Admitting: Physical Therapy

## 2023-04-30 ENCOUNTER — Other Ambulatory Visit (INDEPENDENT_AMBULATORY_CARE_PROVIDER_SITE_OTHER): Payer: Medicare Other

## 2023-04-30 DIAGNOSIS — M25571 Pain in right ankle and joints of right foot: Secondary | ICD-10-CM

## 2023-04-30 DIAGNOSIS — M25671 Stiffness of right ankle, not elsewhere classified: Secondary | ICD-10-CM

## 2023-04-30 DIAGNOSIS — R262 Difficulty in walking, not elsewhere classified: Secondary | ICD-10-CM

## 2023-04-30 DIAGNOSIS — E278 Other specified disorders of adrenal gland: Secondary | ICD-10-CM

## 2023-05-03 ENCOUNTER — Ambulatory Visit: Payer: Medicare Other

## 2023-05-04 LAB — CORTISOL: Cortisol: 2.2 ug/dL — ABNORMAL LOW (ref 6.2–19.4)

## 2023-05-04 LAB — ALDOSTERONE + RENIN ACTIVITY W/ RATIO
Aldos/Renin Ratio: 4.8 (ref 0.0–30.0)
Aldosterone: 3.4 ng/dL (ref 0.0–30.0)
Renin Activity, Plasma: 0.706 ng/mL/h (ref 0.167–5.380)

## 2023-05-06 ENCOUNTER — Ambulatory Visit: Payer: Self-pay

## 2023-05-06 NOTE — Patient Outreach (Signed)
Care Coordination   Follow Up Visit Note   05/06/2023 Name: Jonathan [name] MRN: [id number] DOB: 1955-11-28  Jonathan [name] is a 68 y.o. year old male who sees Dickie La, MD for primary care. I spoke with  Shelba Flake by phone today.  What matters to the patients health and wellness today?  The patient would like to receive resources to help with food costs.    Goals Addressed             This Visit's Progress    COMPLETED: Care Coordination Activities       Care Coordination Interventions: Discussed the patient experiences difficulty affording food due to rising food costs and the cost of living. The patient reports some people he knows receives a food benefit card from their health plan that offers up to $300 a month to spend on food. The patient reports he only receives $30 a month to purchase over the counter items. The patient would like to understand health plan benefit better Education provided on the different types of health plans advising individuals may receive a healthy food card when they receive both Medicare and Medicaid Discussed the patient only receive Medicare and has never applied for Medicaid. Reviewed that based on patients income he would not qualify for Medicaid and therefore would not be able to receive a healthy food allowance from his health plan Reviewed opportunity to access local food pantry sites to supplement food and lessen costs at the store Mailed the patient a list of food pantry locations in [place] Discussed plan for patient to contact SW as needed         SDOH assessments and interventions completed:  Yes  SDOH Interventions Today    Flowsheet Row Most Recent Value  SDOH Interventions   Food Insecurity Interventions Other (Comment)  [Mailed list of food pantries]  Housing Interventions Intervention Not Indicated  Transportation Interventions Intervention Not Indicated        Care Coordination Interventions:  Yes,  provided   Interventions Today    Flowsheet Row Most Recent Value  Chronic Disease   Chronic disease during today's visit Other, Hypertension (HTN)  [Food Insecurity]  General Interventions   General Interventions Discussed/Reviewed General Interventions Discussed, [employer]  [Provided the patient a list of food pantries in [employer]  Education Interventions   Education Provided Provided Therapist, sports, Provided Education  Provided Verbal Education On [employer], Development worker, community  [Educated patient on different health plan options related to food costs,  provided the patient with a list of food pantry sites in [place]]  Nutrition Interventions   Nutrition Discussed/Reviewed Nutrition Discussed        Follow up plan: No further intervention required.   Encounter Outcome:  Patient Visit Completed   Bevelyn Ngo, BSW, CDP [facility]  [facility], [facility] Social Worker Direct Dial: [phone]  Fax: [phone]

## 2023-05-06 NOTE — Patient Instructions (Signed)
Visit Information  Thank you for taking time to visit with me today. Please don't hesitate to contact me if I can be of assistance to you.   Following are the goals we discussed today:   Goals Addressed             This Visit's Progress    COMPLETED: Care Coordination Activities       Care Coordination Interventions: Discussed the patient experiences difficulty affording food due to rising food costs and the cost of living. The patient reports some people he knows receives a food benefit card from their health plan that offers up to $300 a month to spend on food. The patient reports he only receives $30 a month to purchase over the counter items. The patient would like to understand health plan benefit better Education provided on the different types of health plans advising individuals may receive a healthy food card when they receive both Medicare and Medicaid Discussed the patient only receive Medicare and has never applied for Medicaid. Reviewed that based on patients income he would not qualify for Medicaid and therefore would not be able to receive a healthy food allowance from his health plan Reviewed opportunity to access local food pantry sites to supplement food and lessen costs at the store Mailed the patient a list of food pantry locations in [place] Discussed plan for patient to contact SW as needed         If you are experiencing a Mental Health or Behavioral Health Crisis or need someone to talk to, please call 1-800-273-TALK (toll free, 24 hour hotline) go to [facility] [address] [place] [phone]) call 911  Patient verbalizes understanding of instructions and care plan provided today and agrees to view in MyChart. Active MyChart status and patient understanding of how to access instructions and care plan via MyChart confirmed with patient.     No further follow up required: Please contact me as needed.  Bevelyn Ngo, BSW, CDP [facility]  [facility], [facility] Social Worker Direct Dial: [phone]  Fax: [phone]

## 2023-05-07 ENCOUNTER — Ambulatory Visit: Payer: Medicare Other

## 2023-05-09 ENCOUNTER — Encounter: Payer: Self-pay | Admitting: Physical Therapy

## 2023-05-09 ENCOUNTER — Ambulatory Visit: Payer: Medicare Other | Admitting: Physical Therapy

## 2023-05-09 DIAGNOSIS — M25571 Pain in right ankle and joints of right foot: Secondary | ICD-10-CM

## 2023-05-09 DIAGNOSIS — R262 Difficulty in walking, not elsewhere classified: Secondary | ICD-10-CM

## 2023-05-09 DIAGNOSIS — M25671 Stiffness of right ankle, not elsewhere classified: Secondary | ICD-10-CM | POA: Diagnosis not present

## 2023-05-09 NOTE — Therapy (Signed)
OUTPATIENT PHYSICAL THERAPY TREATMENT NOTE   Patient Name: Jonathan [name] MRN: [id number] DOB:1955/12/27, 67 y.o., male Today's Date: 05/10/2023  END OF SESSION:  PT End of Session - 05/09/23 1532     Visit Number 5    Number of Visits 12    Date for PT Re-Evaluation 05/20/23    Authorization Type [employer] MCR    PT Start Time 0331    PT Stop Time 0414    PT Time Calculation (min) 43 min    Activity Tolerance Patient tolerated treatment well    Behavior During Therapy [facility] for tasks assessed/performed                 Past Medical History:  Diagnosis Date   Allergy    Anxiety    Blood transfusion without reported diagnosis    as baby   Chronic Bil Foot Pain 05/27/2011   Lt > Rt - since age 68 2/2 to post polio syndrome  Corns and callosities on both feet  Reconstruction surgery at age 43. Uses a brace on left leg for many years On prn tylenol for pain  No candidate for opiates due to active substance abuse Podiatry and PT (for brace eval) on 04/23/2013     Chronic Bil Foot Pain 05/27/2011   Lt > Rt - since age 33 2/2 to post polio syndrome  Corns and callosities on both feet  Reconstruction surgery at age 88. Uses a brace on left leg for many years On prn tylenol for pain  No candidate for opiates due to active substance abuse Podiatry and PT (for brace eval) on 04/23/2013    Chronic viral hepatitis C (HCC) 06/05/2006   History of blood transfusion in childhood Dx 2010 Reports history of Rx while in [place], unknown yr Elevated LFTs since 2010 and repeat 2014 U/S 2009 - no cirrhosis, repeat in 12/2013 >> normal.  Fibrosure >> pending results  Referred to Hep C clinic 04/23/2013 >> appt in 06/2013 Hep C clinic [phone] Immunity to hep A per labs at Hep C clinic  Started on Hep B series 1 st dose 02/03/2014, 2 dose given 04/23/2014. Last dose about 09/2014.  Follow with Hep C clinic        ERECTILE DYSFUNCTION 06/05/2006   Qualifier: Diagnosis of  By: Janee Morn MD, Daniel      Essential hypertension 06/05/2006   Dx in 2008  Has BP machine at home  Checks erratically  Chronic no show issues       GERD (gastroesophageal reflux disease)    occ s/s   Hypertension    Polysubstance abuse (HCC) 04/23/2013   Cocaine (sniffs powder) and marijuana No IVDU    POST-POLIO SYNDROME 06/05/2006   Hx of polio at age 7 Has spasms in his legs on and off Used Flexeril on and off for spasms 2015 May >> sent to rehab/PT. rec new leg braces/ortho     Preventative health care 05/27/2011   Colonoscopy July 2019 polyps --> rec'd f/u in 5 years   Past Surgical History:  Procedure Laterality Date   COLONOSCOPY     FRACTURE SURGERY     KNEE ARTHROSCOPY Right    Patient Active Problem List   Diagnosis Date Noted   Aortic atherosclerosis (HCC) 04/22/2023   Adrenal nodule (HCC) 04/22/2023   Healthcare maintenance 03/26/2023   Alcohol use 03/26/2023   Weight loss 03/26/2023   Blurry vision, bilateral 02/04/2023   Prostate cancer screening 10/08/2022   Prediabetes 10/08/2022  Stage 3a chronic kidney disease (HCC) 10/08/2022   Hyperlipidemia 10/08/2022   Muscle tension pain 05/22/2022   Gastroesophageal reflux disease 04/19/2021   Tobacco use disorder 06/23/2020   Hammer toe of left foot 11/19/2019   Tubular adenoma 01/22/2019   Spinal stenosis 12/16/2015   POST-POLIO SYNDROME 06/05/2006   Essential hypertension 06/05/2006    PCP: Dickie La MD   REFERRING PROVIDER: Edwin Cap, DPM  REFERRING DIAG: [phone] (ICD-10-CM) - Anterior tibialis tendinitis, right M62.461 (ICD-10-CM) - Gastrocnemius equinus of right lower extremity  THERAPY DIAG:  Stiffness of right ankle, not elsewhere classified  Pain in right ankle and joints of right foot  Difficulty in walking, not elsewhere classified  Rationale for Evaluation and Treatment: Rehabilitation  ONSET DATE: 1 month ago   SUBJECTIVE:   SUBJECTIVE STATEMENT: Pt reports that aquatics was very helpful last time.  He  states that he has some soreness but minimal pain.  PERTINENT HISTORY: Continues to have paresthesias and neuropathic pain in lower extremities, previously evaluated and felt secondary to lumbar stenosis > post-polio syndrome. Mr. Vi reports good functional ability and has a cane today to help with mobility. He is interested in attending water aerobics for further strengthening and mobility. I have encouraged him to discuss Silver Sneakers with his insurance and the [employer] in order to start classes. PAIN:  Are you having pain? Yes: NPRS scale: minimal,  2-3/10 Pain location: Rt foot  Pain description: tender, sore  Aggravating factors: standing, walking  Relieving factors: boot, rest  PRECAUTIONS: Fall/balance due to LLE weakness   RED FLAGS: None   WEIGHT BEARING RESTRICTIONS: No  FALLS:  Has patient fallen in last 6 months? NoDoes stumbles quite a bit.   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   PLOF: Independent with basic ADLs, Independent with household mobility without device, Independent with community mobility with device, Needs assistance with ADLs, and Leisure: assist for lower body, shoes   PATIENT GOALS: I want to get this pain out of it   NEXT MD VISIT: after PT   OBJECTIVE:   DIAGNOSTIC FINDINGS: Radiographs: Multiple views x-ray of the right foot: no fracture, dislocation, swelling or degenerative changes noted  PATIENT SURVEYS:  FOTO 51%  COGNITION: Overall cognitive status: Within functional limits for tasks assessed     SENSATION: WFL does note some occasional tingling/numbness in feet, mild   EDEMA:  NT , none noted   MUSCLE LENGTH: Hamstrings: tight  Thomas test:NT   POSTURE: rounded shoulders, posterior pelvic  tilt, and flexed trunk  Foot : pes equinus , Rt toes hyperextended and hallux valgus.  Lt. Foot affected similarly with decreased toe motion  PALPATION: Sore, tender along dorsum of foot from Gr toe up along the line of anterior tibialis.  He reports hypersensitivity and pain when it first began.   LOWER EXTREMITY ROM:  Active ROM Right eval Left eval  Hip flexion WNL WNL   Hip extension    Hip abduction    Hip adduction    Hip internal rotation WNL WNL   Hip external rotation WNL WNL   Knee flexion WNL  WNL   Knee extension WFL   Ankle dorsiflexion -9 active  -8 passively   Ankle plantarflexion 45-50 deg    Ankle inversion 40  Ankle eversion 20    (Blank rows = not tested)  LOWER EXTREMITY MMT:  MMT Right eval Left eval  Hip flexion 4+ 4-  Hip extension    Hip abduction    Hip adduction    Hip internal rotation    Hip external rotation    Knee flexion 5 4+  Knee extension 5 4+  Ankle dorsiflexion 5 3-  Ankle plantarflexion 4 3  Ankle inversion 4 4-  Ankle eversion 4 4-   (Blank rows = not tested)  LOWER EXTREMITY SPECIAL TESTS:  NT   FUNCTIONAL TESTS:  5 times sit to stand: 23 sec   GAIT: Distance walked: 100 Assistive device utilized: Single point cane Level of assistance: Modified independence Comments: stumbled x 1     04/23/23 0001  Balance  Balance Assessed Yes  Standardized Balance Assessment  Standardized Balance Assessment Berg Balance Test  Berg Balance Test  Sit to Stand 4  Standing Unsupported 4  Sitting with Back Unsupported but Feet Supported on Floor or Stool 4  Stand to Sit 3  Transfers 4  Standing Unsupported with Eyes Closed 3  Standing Unsupported with Feet Together 3  From Standing, Reach Forward with Outstretched Arm 4  From Standing Position, Pick up Object from Floor 4  From Standing Position, Turn to Look Behind Over each Shoulder 3  Turn 360 Degrees 2  Standing Unsupported, Alternately Place Feet on Step/Stool 1   Standing Unsupported, One Foot in Front 0  Standing on One Leg 1  Total Score 40    TODAY'S TREATMENT:     [facility] Adult PT Treatment:                                                DATE: 05/09/23 Aquatic therapy at [facility]- [address] - therapeutic pool temp approximately 91 degrees. Pt enters building ambulating independently with SPC. Treatment took place in water 3.8 to  4 ft 8 in. deep depending upon activity.  Pt entered and exited the pool via stair and handrails independently. Patient entered water for aquatic therapy for first time and was introduced to principles and therapeutic effects of water as they ambulated and acclimated to pool.  Aquatic Exercise: Walking forward/backwards/side stepping Lunge stepping forwards x1 lap Side stepping with rainbow DB shoulder abd/add x2 laps Step ups bottom step fwd/lat x10 ea BIL Heel toe raises Standing with UE support edge of pool: Hip abd/add x20 BIL Heel toe raises 2x20 Hip ext/flex with knee straight x 20 BIL Hip Circles CC/CCW x10 each BIL Marching hip flexion to knee extension 2x10 BIL Squats 2x20  Pt requires the buoyancy of water for active assisted exercises with buoyancy supported for strengthening and AROM exercises. Hydrostatic pressure also supports joints by unweighting joint load by at least 50 % in 3-4 feet depth water. 80% in chest to neck deep water. Water will provide assistance with movement using the current and laminar flow while the buoyancy reduces weight bearing. Pt requires the viscosity of the water for resistance with strengthening exercises.  [facility] Adult PT Treatment:                                                DATE:  05/07/23 Therapeutic Exercise: NuStep lvel 5 x 5 mins Seated DF green band x 15  Calf stretch 3 x 30 sec strap  Seated PF with and without wgt (15 lbs )  x 20  Calf stretch off edge of step 30 sec  Heel raise off edge of step  x 10 , able to do a bit on the Lt LE  Toe raise Rt  foot x 15  SLS each LE x 3 trials, , 8 sec each side Rt LE less stable  Leg press 2 plates x 15 double leg 1 plate single leg  x 10 each    [facility] Adult PT Treatment:                                                DATE: 04/30/23 Therapeutic Exercise: NuStep  Seated DF green band x 15  Calf stretch 3 x 30 sec strap  Seated PF with and without wgt (15 lbs )  x 20  Calf stretch off edge of step 30 sec  Heel raise off edge of step  x 10 , able to do a bit on the Lt LE  Toe raise Rt foot x 15  SLS each LE x 3 trials, , 8 sec each side Rt LE less stable  Leg press 2 plates x 15 double leg 1 plate single leg  x 10 each    [facility] Adult PT Treatment:                                                DATE: 04/26/23 Aquatic therapy at [facility]- [address] - therapeutic pool temp approximately 91 degrees. Pt enters building ambulating independently with SPC. Treatment took place in water 3.8 to  4 ft 8 in. deep depending upon activity.  Pt entered and exited the pool via stair and handrails independently. Patient entered water for aquatic therapy for first time and was introduced to principles and therapeutic effects of water as they ambulated and acclimated to pool.  Aquatic Exercise: Walking forward/backwards/side stepping Lunge stepping forwards x1 lap Side stepping with rainbow DB shoulder abd/add x2 laps Tandem stance  x30" BIL Step ups bottom step fwd/lat x10 ea BIL Standing with UE support edge of pool: Hip abd/add x20 BIL Heel raises 2x20 Hip ext/flex with knee straight x 20 BIL Hip Circles CC/CCW x10 each BIL Marching hip flexion to knee extension 2x10 BIL Squats 2x20  Pt requires the buoyancy of water for active assisted exercises with buoyancy supported for strengthening and AROM exercises. Hydrostatic pressure also supports joints by unweighting joint load by at least 50 % in 3-4 feet depth water. 80% in chest to neck deep water. Water will provide assistance with movement using  the current and laminar flow while the buoyancy reduces weight bearing. Pt requires the viscosity of the water for resistance with strengthening exercises.     PATIENT EDUCATION:  Education details: POC, HEP, balance and weakness  Person educated: Patient Education method: Explanation, Demonstration, Tactile cues, Verbal cues, and Handouts Education comprehension: verbalized understanding and needs further education  HOME EXERCISE PROGRAM: Access Code: [email or web address] URL: [email or web address] Date: 04/08/2023 Prepared by: Karie Mainland  Exercises - Seated Calf Stretch with Strap  -  1 x daily - 7 x weekly - 1 sets - 10 reps - 30 hold - Seated Ankle Dorsiflexion with Resistance  - 1 x daily - 7 x weekly - 2 sets - 10 reps - 5 hold - Seated Figure 4 Ankle Inversion with Resistance  - 1 x daily - 7 x weekly - 2 sets - 10 reps - 5 hold - Gastroc Stretch on Wall  - 1 x daily - 7 x weekly - 1 sets - 5 reps - 30 hold  ASSESSMENT:  CLINICAL IMPRESSION: Session today focused on general activity tolerance and ankle strengthening in the aquatic environment for use of buoyancy to offload joints and the viscosity of water as resistance during therapeutic exercise.  Pt cued for form/body positioning and pacing throughout.  Patient was able to tolerate all prescribed exercises in the aquatic environment with no adverse effects and reports 2/10 pain at the end of the session. Patient continues to benefit from skilled PT services on land and aquatic based and should be progressed as able to improve functional independence.    OBJECTIVE IMPAIRMENTS: Abnormal gait, decreased balance, decreased mobility, difficulty walking, decreased ROM, decreased strength, increased fascial restrictions, impaired sensation, postural dysfunction, and pain.   ACTIVITY LIMITATIONS: lifting, standing, squatting, stairs, transfers, and locomotion level  PARTICIPATION LIMITATIONS: interpersonal relationship, driving,  shopping, community activity, and occupation  PERSONAL FACTORS: Past/current experiences and 1-2 comorbidities: Previous foot surgery x 2, left ankle foot deformity and weakness  are also affecting patient's functional outcome.   REHAB POTENTIAL: Excellent  CLINICAL DECISION MAKING: Evolving/moderate complexity  EVALUATION COMPLEXITY: Moderate   GOALS: Goals reviewed with patient? Yes  SHORT TERM GOALS:  2 weeks (04/22/23)     Patient will be independent with basic home program for right lower extremity range of motion and strength Baseline: Given on evaluation Goal status: Ongoing  2.  Patient will complete balance screening to assess fall risk Baseline: Unknown Goal status: BERG performed 04/23/23  3.  Pt will complete 2-6 min walk test and set goal  Baseline: Unknown  Goal status: performed 04/23/23   LONG TERM GOALS: Target date: 05/20/2023    Pt will be I with HEP for long term health and mobility (including aquatics, if able)  Baseline: unknown, would like to return to aqua fitness  Goal status: INITIAL  2.  Pt will improve FOTO score to 64% or better to demo improved functional mobility  Baseline: 48% Goal status: INITIAL  3.  Pt will be able to walk 300 feet in the community with cane without increased foot pain  Baseline: limps, boot intermittently and pain is min to mod  Goal status: INITIAL  4.  Pt will return to driving without the boot ([employer]) without increased pain  Baseline: has limited this a great deal  Goal status: INITIAL  5.  Pt will be able to demo Rt ankle DF to +5 deg for better gait and stair negotiation  Baseline: lacks 9 deg  Goal status: INITIAL  6.  Functional testing, balance goals TBA based on assessment  Baseline: NT  Goal status: INITIAL   PLAN:  PT FREQUENCY: 2x/week  PT DURATION: 6 weeks  PLANNED INTERVENTIONS: Therapeutic exercises, Therapeutic activity, Neuromuscular re-education, Balance training, Gait  training, Patient/Family education, Self Care, Joint mobilization, Aquatic Therapy, Cryotherapy, Moist heat, Manual therapy, and Re-evaluation  PLAN FOR NEXT SESSION: check HEP, Nustep, 2 or 6 min walks test, balance     Fredderick Phenix PT 05/10/23 8:53  AM Phone: [phone] Fax: [phone]

## 2023-05-13 ENCOUNTER — Telehealth: Payer: Self-pay

## 2023-05-13 NOTE — Progress Notes (Signed)
Reviewed lab results. No evidence of hyperaldosteronism. Cortisol after overnight DMST >1.8, recommend confirmatory testing for subclinical Cushing syndrome. Will refer to endocrinology. Jonathan [name] had further questions but was unable to talk at the time of our phone call, he will call the clinic later today.

## 2023-05-13 NOTE — Telephone Encounter (Signed)
Returned patients call.

## 2023-05-13 NOTE — Telephone Encounter (Signed)
Pt is returning your call

## 2023-05-13 NOTE — Addendum Note (Signed)
Addended by: Dickie La on: 05/13/2023 11:51 AM   Modules accepted: Orders

## 2023-05-15 ENCOUNTER — Ambulatory Visit: Payer: Medicare Other

## 2023-05-15 DIAGNOSIS — M25571 Pain in right ankle and joints of right foot: Secondary | ICD-10-CM | POA: Diagnosis not present

## 2023-05-15 DIAGNOSIS — M25671 Stiffness of right ankle, not elsewhere classified: Secondary | ICD-10-CM | POA: Diagnosis not present

## 2023-05-15 DIAGNOSIS — R262 Difficulty in walking, not elsewhere classified: Secondary | ICD-10-CM

## 2023-05-15 NOTE — Therapy (Signed)
OUTPATIENT PHYSICAL THERAPY TREATMENT NOTE   Patient Name: Jonathan [name] MRN: [id number] DOB:1955/10/24, 67 y.o., male Today's Date: 05/15/2023  END OF SESSION:  PT End of Session - 05/15/23 1042     Visit Number 6    Number of Visits 12    Date for PT Re-Evaluation 05/20/23    Authorization Type [employer] MCR    PT Start Time 1045    PT Stop Time 1125    PT Time Calculation (min) 40 min    Activity Tolerance Patient tolerated treatment well    Behavior During Therapy WFL for tasks assessed/performed             Past Medical History:  Diagnosis Date   Allergy    Anxiety    Blood transfusion without reported diagnosis    as baby   Chronic Bil Foot Pain 05/27/2011   Lt > Rt - since age 61 2/2 to post polio syndrome  Corns and callosities on both feet  Reconstruction surgery at age 10. Uses a brace on left leg for many years On prn tylenol for pain  No candidate for opiates due to active substance abuse Podiatry and PT (for brace eval) on 04/23/2013     Chronic Bil Foot Pain 05/27/2011   Lt > Rt - since age 34 2/2 to post polio syndrome  Corns and callosities on both feet  Reconstruction surgery at age 50. Uses a brace on left leg for many years On prn tylenol for pain  No candidate for opiates due to active substance abuse Podiatry and PT (for brace eval) on 04/23/2013    Chronic viral hepatitis C (HCC) 06/05/2006   History of blood transfusion in childhood Dx 2010 Reports history of Rx while in [place], unknown yr Elevated LFTs since 2010 and repeat 2014 U/S 2009 - no cirrhosis, repeat in 12/2013 >> normal.  Fibrosure >> pending results  Referred to Hep C clinic 04/23/2013 >> appt in 06/2013 Hep C clinic [phone] Immunity to hep A per labs at Hep C clinic  Started on Hep B series 1 st dose 02/03/2014, 2 dose given 04/23/2014. Last dose about 09/2014.  Follow with Hep C clinic        ERECTILE DYSFUNCTION 06/05/2006   Qualifier: Diagnosis of  By: Jonathan Morn MD, Jonathan [name]     Essential  hypertension 06/05/2006   Dx in 2008  Has BP machine at home  Checks erratically  Chronic no show issues       GERD (gastroesophageal reflux disease)    occ s/s   Hypertension    Polysubstance abuse (HCC) 04/23/2013   Cocaine (sniffs powder) and marijuana No IVDU    POST-POLIO SYNDROME 06/05/2006   Hx of polio at age 47 Has spasms in his legs on and off Used Flexeril on and off for spasms 2015 May >> sent to rehab/PT. rec new leg braces/ortho     Preventative health care 05/27/2011   Colonoscopy July 2019 polyps --> rec'd f/u in 5 years   Past Surgical History:  Procedure Laterality Date   COLONOSCOPY     FRACTURE SURGERY     KNEE ARTHROSCOPY Right    Patient Active Problem List   Diagnosis Date Noted   Aortic atherosclerosis (HCC) 04/22/2023   Adrenal nodule (HCC) 04/22/2023   Healthcare maintenance 03/26/2023   Alcohol use 03/26/2023   Weight loss 03/26/2023   Blurry vision, bilateral 02/04/2023   Prostate cancer screening 10/08/2022   Prediabetes 10/08/2022   Stage 3a chronic  kidney disease (HCC) 10/08/2022   Hyperlipidemia 10/08/2022   Muscle tension pain 05/22/2022   Gastroesophageal reflux disease 04/19/2021   Tobacco use disorder 06/23/2020   Hammer toe of left foot 11/19/2019   Tubular adenoma 01/22/2019   Spinal stenosis 12/16/2015   POST-POLIO SYNDROME 06/05/2006   Essential hypertension 06/05/2006    PCP: Jonathan La MD   REFERRING PROVIDER: Edwin [name], DPM  REFERRING DIAG: [phone] (ICD-10-CM) - Anterior tibialis tendinitis, right M62.461 (ICD-10-CM) - Gastrocnemius equinus of right lower extremity  THERAPY DIAG:  Stiffness of right ankle, not elsewhere classified  Pain in right ankle and joints of right foot  Difficulty in walking, not elsewhere classified  Rationale for Evaluation and Treatment: Rehabilitation  ONSET DATE: 1 month ago   SUBJECTIVE:   SUBJECTIVE STATEMENT: Patient reports minimal soreness after last aquatic session.    PERTINENT HISTORY: Continues to have paresthesias and neuropathic pain in lower extremities, previously evaluated and felt secondary to lumbar stenosis > post-polio syndrome. Jonathan [name] reports good functional ability and has a cane today to help with mobility. He is interested in attending water aerobics for further strengthening and mobility. I have encouraged him to discuss Jonathan [name] with his insurance and the Jonathan [name] in order to start classes. PAIN:  Are you having pain? Yes: NPRS scale: minimal,  2-3/10 Pain location: Rt foot  Pain description: tender, sore  Aggravating factors: standing, walking  Relieving factors: boot, rest  PRECAUTIONS: Fall/balance due to LLE weakness   RED FLAGS: None   WEIGHT BEARING RESTRICTIONS: No  FALLS:  Has patient fallen in last 6 months? NoDoes stumbles quite a bit.   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   PLOF: Independent with basic ADLs, Independent with household mobility without device, Independent with community mobility with device, Needs assistance with ADLs, and Leisure: assist for lower body, shoes   PATIENT GOALS: I want to get this pain out of it   NEXT MD VISIT: after PT   OBJECTIVE:   DIAGNOSTIC FINDINGS: Radiographs: Multiple views x-ray of the right foot: no fracture, dislocation, swelling or degenerative changes noted  PATIENT SURVEYS:  FOTO 51%  COGNITION: Overall cognitive status: Within functional limits for tasks assessed     SENSATION: WFL does note some occasional tingling/numbness in feet, mild   EDEMA:  NT , none noted   MUSCLE LENGTH: Hamstrings: tight  Thomas test:NT   POSTURE: rounded shoulders, posterior pelvic tilt, and flexed trunk  Foot : pes equinus , Rt toes  hyperextended and hallux valgus.  Lt. Foot affected similarly with decreased toe motion  PALPATION: Sore, tender along dorsum of foot from Gr toe up along the line of anterior tibialis.  He reports hypersensitivity and pain when it first began.   LOWER EXTREMITY ROM:  Active ROM Right eval Left eval  Hip flexion WNL WNL   Hip extension    Hip abduction    Hip adduction    Hip internal rotation WNL WNL   Hip external rotation WNL WNL   Knee flexion WNL  WNL   Knee extension WFL   Ankle dorsiflexion -9 active  -8 passively   Ankle plantarflexion 45-50 deg    Ankle inversion 40   Ankle eversion 20    (Blank rows = not tested)  LOWER  EXTREMITY MMT:  MMT Right eval Left eval  Hip flexion 4+ 4-  Hip extension    Hip abduction    Hip adduction    Hip internal rotation    Hip external rotation    Knee flexion 5 4+  Knee extension 5 4+  Ankle dorsiflexion 5 3-  Ankle plantarflexion 4 3  Ankle inversion 4 4-  Ankle eversion 4 4-   (Blank rows = not tested)  LOWER EXTREMITY SPECIAL TESTS:  NT   FUNCTIONAL TESTS:  5 times sit to stand: 23 sec   GAIT: Distance walked: 100 Assistive device utilized: Single point cane Level of assistance: Modified independence Comments: stumbled x 1     04/23/23 0001  Balance  Balance Assessed Yes  Standardized Balance Assessment  Standardized Balance Assessment Berg Balance Test  Berg Balance Test  Sit to Stand 4  Standing Unsupported 4  Sitting with Back Unsupported but Feet Supported on Floor or Stool 4  Stand to Sit 3  Transfers 4  Standing Unsupported with Eyes Closed 3  Standing Unsupported with Feet Together 3  From Standing, Reach Forward with Outstretched Arm 4  From Standing Position, Pick up Object from Floor 4  From Standing Position, Turn to Look Behind Over each Shoulder 3  Turn 360 Degrees 2  Standing Unsupported, Alternately Place Feet on Step/Stool 1  Standing Unsupported, One Foot in Front 0  Standing on  One Leg 1  Total Score 40    TODAY'S TREATMENT:    [facility] Adult PT Treatment:                                                DATE: 05/15/23 Therapeutic Exercise: NuStep level 5 x 5 mins Slant board stretch 2x30" Heel raise off edge of step 2 x 10  Toe raise Rt foot 2x10  SLS each LE x 2 trials, 8 sec each side Lt LE less stable  Seated DF green band x 15 BIL Calf stretch 2 x 30 sec strap  Seated PF with and without wgt (15 lbs )  x 20  Leg press seated 10, 2 plates x 15 double leg, 1 plate single leg  x 10 each    [facility] Adult PT Treatment:                                                DATE: 05/09/23 Aquatic therapy at [facility]- [address] - therapeutic pool temp approximately 91 degrees. Pt enters building ambulating independently with SPC. Treatment took place in water 3.8 to  4 ft 8 in. deep depending upon activity.  Pt entered and exited the pool via stair and handrails independently. Patient entered water for aquatic therapy for first time and was introduced to principles and therapeutic effects of water as they ambulated and acclimated to pool.  Aquatic Exercise: Walking forward/backwards/side stepping Lunge stepping forwards x1 lap Side stepping with rainbow DB shoulder abd/add x2 laps Step ups bottom step fwd/lat x10 ea BIL Heel toe raises Standing with UE support edge of pool: Hip abd/add x20 BIL Heel toe raises 2x20 Hip ext/flex with knee straight x 20 BIL Hip Circles CC/CCW x10 each BIL Marching hip flexion to knee extension 2x10  BIL Squats 2x20  Pt requires the buoyancy of water for active assisted exercises with buoyancy supported for strengthening and AROM exercises. Hydrostatic pressure also supports joints by unweighting joint load by at least 50 % in 3-4 feet depth water. 80% in chest to neck deep water. Water will provide assistance with movement using the current and laminar flow while the buoyancy reduces weight bearing. Pt requires the viscosity of the  water for resistance with strengthening exercises.   [facility] Adult PT Treatment:                                                DATE: 04/30/23 Therapeutic Exercise: NuStep  Seated DF green band x 15  Calf stretch 3 x 30 sec strap  Seated PF with and without wgt (15 lbs )  x 20  Calf stretch off edge of step 30 sec  Heel raise off edge of step  x 10 , able to do a bit on the Lt LE  Toe raise Rt foot x 15  SLS each LE x 3 trials, , 8 sec each side Rt LE less stable  Leg press 2 plates x 15 double leg 1 plate single leg  x 10 each        PATIENT EDUCATION:  Education details: POC, HEP, balance and weakness  Person educated: Patient Education method: Explanation, Demonstration, Tactile cues, Verbal cues, and Handouts Education comprehension: verbalized understanding and needs further education  HOME EXERCISE PROGRAM: Access Code: [email or web address] URL: [email or web address] Date: 04/08/2023 Prepared by: Karie Mainland  Exercises - Seated Calf Stretch with Strap  - 1 x daily - 7 x weekly - 1 sets - 10 reps - 30 hold - Seated Ankle Dorsiflexion with Resistance  - 1 x daily - 7 x weekly - 2 sets - 10 reps - 5 hold - Seated Figure 4 Ankle Inversion with Resistance  - 1 x daily - 7 x weekly - 2 sets - 10 reps - 5 hold - Gastroc Stretch on Wall  - 1 x daily - 7 x weekly - 1 sets - 5 reps - 30 hold  ASSESSMENT:  CLINICAL IMPRESSION: Patient presents to PT reporting minimal soreness after last aquatic session and that he has been compliant with his HEP. Session today continued to focus on distal LE strengthening and stretching. Patient was able to tolerate all prescribed exercises with no adverse effects. Patient continues to benefit from skilled PT services and should be progressed as able to improve functional independence.    OBJECTIVE IMPAIRMENTS: Abnormal gait, decreased balance, decreased mobility, difficulty walking, decreased ROM, decreased strength, increased fascial  restrictions, impaired sensation, postural dysfunction, and pain.   ACTIVITY LIMITATIONS: lifting, standing, squatting, stairs, transfers, and locomotion level  PARTICIPATION LIMITATIONS: interpersonal relationship, driving, shopping, community activity, and occupation  PERSONAL FACTORS: Past/current experiences and 1-2 comorbidities: Previous foot surgery x 2, left ankle foot deformity and weakness  are also affecting patient's functional outcome.   REHAB POTENTIAL: Excellent  CLINICAL DECISION MAKING: Evolving/moderate complexity  EVALUATION COMPLEXITY: Moderate   GOALS: Goals reviewed with patient? Yes  SHORT TERM GOALS:  2 weeks (04/22/23)     Patient will be independent with basic home program for right lower extremity range of motion and strength Baseline: Given on evaluation Goal status: Ongoing  2.  Patient will complete balance screening to assess fall risk  Baseline: Unknown Goal status: BERG performed 04/23/23  3.  Pt will complete 2-6 min walk test and set goal  Baseline: Unknown  Goal status: performed 04/23/23   LONG TERM GOALS: Target date: 05/20/2023    Pt will be I with HEP for long term health and mobility (including aquatics, if able)  Baseline: unknown, would like to return to aqua fitness  Goal status: INITIAL  2.  Pt will improve FOTO score to 64% or better to demo improved functional mobility  Baseline: 48% Goal status: INITIAL  3.  Pt will be able to walk 300 feet in the community with cane without increased foot pain  Baseline: limps, boot intermittently and pain is min to mod  Goal status: INITIAL  4.  Pt will return to driving without the boot ([employer]/Jonathan [name]) without increased pain  Baseline: has limited this a great deal  Goal status: INITIAL  5.  Pt will be able to demo Rt ankle DF to +5 deg for better gait and stair negotiation  Baseline: lacks 9 deg  Goal status: INITIAL  6.  Functional testing, balance goals TBA based on  assessment  Baseline: NT  Goal status: INITIAL   PLAN:  PT FREQUENCY: 2x/week  PT DURATION: 6 weeks  PLANNED INTERVENTIONS: Therapeutic exercises, Therapeutic activity, Neuromuscular re-education, Balance training, Gait training, Patient/Family education, Self Care, Joint mobilization, Aquatic Therapy, Cryotherapy, Moist heat, Manual therapy, and Re-evaluation  PLAN FOR NEXT SESSION: check HEP, Nustep, 2 or 6 min walks test, balance    Berta Minor PTA 05/15/23 11:30 AM Phone: [phone] Fax: [phone]

## 2023-05-20 NOTE — Therapy (Unsigned)
OUTPATIENT PHYSICAL THERAPY TREATMENT NOTE   Patient Name: Jonathan [name] MRN: [id number] DOB:[birth year], 68 y.o., male Today's Date: 05/21/2023    PHYSICAL THERAPY DISCHARGE SUMMARY  Visits from Start of Care: 7  Current functional level related to goals / functional outcomes: See below   Remaining deficits: LLE weakness, balance    Education / Equipment: HEP, balance, gym equipment    Patient agrees to discharge. Patient goals were partially met. Patient is being discharged due to being pleased with the current functional level.     END OF SESSION:  PT End of Session - 05/21/23 1147     Visit Number 7    Number of Visits 12    Date for PT Re-Evaluation 05/20/23    Authorization Type [employer] MCR    Authorization Time Period 05/24/23 12 visits    PT Start Time 1145    PT Stop Time 1217    PT Time Calculation (min) 32 min    Activity Tolerance Patient tolerated treatment well    Behavior During Therapy WFL for tasks assessed/performed              Past Medical History:  Diagnosis Date   Allergy    Anxiety    Blood transfusion without reported diagnosis    as baby   Chronic Bil Foot Pain 05/27/2011   Lt > Rt - since age 81 2/2 to post polio syndrome  Corns and callosities on both feet  Reconstruction surgery at age 45. Uses a brace on left leg for many years On prn tylenol for pain  No candidate for opiates due to active substance abuse Podiatry and PT (for brace eval) on 04/23/2013     Chronic Bil Foot Pain 05/27/2011   Lt > Rt - since age 22 2/2 to post polio syndrome  Corns and callosities on both feet  Reconstruction surgery at age 66. Uses a brace on left leg for many years On prn tylenol for pain  No candidate for opiates due to active substance abuse Podiatry and PT (for brace eval) on 04/23/2013    Chronic viral hepatitis C (HCC) 06/05/2006   History of blood transfusion in childhood Dx 2010 Reports history of Rx while in [place], unknown yr Elevated LFTs  since 2010 and repeat 2014 U/S 2009 - no cirrhosis, repeat in 12/2013 >> normal.  Fibrosure >> pending results  Referred to Hep C clinic 04/23/2013 >> appt in 06/2013 Hep C clinic [phone] Immunity to hep A per labs at Hep C clinic  Started on Hep B series 1 st dose 02/03/2014, 2 dose given 04/23/2014. Last dose about 09/2014.  Follow with Hep C clinic        ERECTILE DYSFUNCTION 06/05/2006   Qualifier: Diagnosis of  By: Janee Morn MD, Daniel     Essential hypertension 06/05/2006   Dx in 2008  Has BP machine at home  Checks erratically  Chronic no show issues       GERD (gastroesophageal reflux disease)    occ s/s   Hypertension    Polysubstance abuse (HCC) 04/23/2013   Cocaine (sniffs powder) and marijuana No IVDU    POST-POLIO SYNDROME 06/05/2006   Hx of polio at age 67 Has spasms in his legs on and off Used Flexeril on and off for spasms 2015 May >> sent to rehab/PT. rec new leg braces/ortho     Preventative health care 05/27/2011   Colonoscopy July 2019 polyps --> rec'd f/u in 5 years   Past Surgical  History:  Procedure Laterality Date   COLONOSCOPY     FRACTURE SURGERY     KNEE ARTHROSCOPY Right    Patient Active Problem List   Diagnosis Date Noted   Aortic atherosclerosis (HCC) 04/22/2023   Adrenal nodule (HCC) 04/22/2023   Healthcare maintenance 03/26/2023   Alcohol use 03/26/2023   Weight loss 03/26/2023   Blurry vision, bilateral 02/04/2023   Prostate cancer screening 10/08/2022   Prediabetes 10/08/2022   Stage 3a chronic kidney disease (HCC) 10/08/2022   Hyperlipidemia 10/08/2022   Muscle tension pain 05/22/2022   Gastroesophageal reflux disease 04/19/2021   Tobacco use disorder 06/23/2020   Hammer toe of left foot 11/19/2019   Tubular adenoma 01/22/2019   Spinal stenosis 12/16/2015   POST-POLIO SYNDROME 06/05/2006   Essential hypertension 06/05/2006    PCP: Dickie La MD   REFERRING PROVIDER: Edwin Cap, DPM  REFERRING DIAG: [phone] (ICD-10-CM) -  Anterior tibialis tendinitis, right M62.461 (ICD-10-CM) - Gastrocnemius equinus of right lower extremity  THERAPY DIAG:  Stiffness of right ankle, not elsewhere classified  Pain in right ankle and joints of right foot  Difficulty in walking, not elsewhere classified  Rationale for Evaluation and Treatment: Rehabilitation  ONSET DATE: 1 month ago   SUBJECTIVE:   SUBJECTIVE STATEMENT: Minimal to no pain in right lower extremity.  He is working, driving without much limitation from pain.   PERTINENT HISTORY: Continues to have paresthesias and neuropathic pain in lower extremities, previously evaluated and felt secondary to lumbar stenosis > post-polio syndrome. Mr. Lindig reports good functional ability and has a okay cane today to help with mobility. He is interested in attending water aerobics for further strengthening and mobility. I have encouraged him to discuss Silver Sneakers with his insurance and the [employer] in order to start classes. PAIN:  Are you having pain? Yes: NPRS scale: minimal,  1/10 Pain location: Rt foot  Pain description: tender, sore  Aggravating factors: standing, walking  Relieving factors: boot, rest  PRECAUTIONS: Fall/balance due to LLE weakness   RED FLAGS: None   WEIGHT BEARING RESTRICTIONS: No  FALLS:  Has patient fallen in last 6 months? NoDoes stumbles quite a bit.   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   PLOF: Independent with basic ADLs, Independent with household mobility without device, Independent with community mobility with device, Needs assistance with ADLs, and Leisure: assist for lower body, shoes   PATIENT GOALS: I want to get this pain out of it   NEXT MD VISIT: after PT   OBJECTIVE:   DIAGNOSTIC  FINDINGS: Radiographs: Multiple views x-ray of the right foot: no fracture, dislocation, swelling or degenerative changes noted  PATIENT SURVEYS:  FOTO 51% 63% on DC  COGNITION: Overall cognitive status: Within functional limits for tasks assessed     SENSATION: WFL does note some occasional tingling/numbness in feet, mild   EDEMA:  NT , none noted   MUSCLE LENGTH: Hamstrings: tight  Thomas test:NT   POSTURE: rounded shoulders, posterior pelvic tilt, and flexed trunk  Foot : pes equinus , Rt toes hyperextended and hallux valgus.  Lt. Foot affected similarly with decreased toe motion  PALPATION: Sore, tender along dorsum of foot from Gr toe up along the line of anterior tibialis.  He reports hypersensitivity and pain when it first  began.   LOWER EXTREMITY ROM:  Active ROM Right eval Left eval  Hip flexion WNL WNL   Hip extension    Hip abduction    Hip adduction    Hip internal rotation WNL WNL   Hip external rotation WNL WNL   Knee flexion WNL  WNL   Knee extension WFL   Ankle dorsiflexion -9 active  -8 passively   Ankle plantarflexion 45-50 deg    Ankle inversion 40   Ankle eversion 20    (Blank rows = not tested)  LOWER EXTREMITY MMT:  MMT Right eval Left eval Rt./Lt.  05/21/23  Hip flexion 4+ 4- 5, 5  Hip extension     Hip abduction     Hip adduction     Hip internal rotation     Hip external rotation     Knee flexion 5 4+ 5, 4+  Knee extension 5 4+ 5, 4+  Ankle dorsiflexion 5 3- 5, 3  Ankle plantarflexion 4 3   Ankle inversion 4 4-   Ankle eversion 4 4-    (Blank rows = not tested)  LOWER EXTREMITY SPECIAL TESTS:  NT   FUNCTIONAL TESTS:  5 times sit to stand: 23 sec  5 x STS 15 sec  GAIT: Distance walked: 100 Assistive device utilized: Single point cane Level of assistance: Modified independence Comments: stumbled x 1     04/23/23 0001  Balance  Balance Assessed Yes  Standardized Balance Assessment  Standardized Balance Assessment  Berg Balance Test  Berg Balance Test  Sit to Stand 4  Standing Unsupported 4  Sitting with Back Unsupported but Feet Supported on Floor or Stool 4  Stand to Sit 3  Transfers 4  Standing Unsupported with Eyes Closed 3  Standing Unsupported with Feet Together 3  From Standing, Reach Forward with Outstretched Arm 4  From Standing Position, Pick up Object from Floor 4  From Standing Position, Turn to Look Behind Over each Shoulder 3  Turn 360 Degrees 2  Standing Unsupported, Alternately Place Feet on Step/Stool 1  Standing Unsupported, One Foot in Front 0  Standing on One Leg 1  Total Score 40    TODAY'S TREATMENT:      [facility] Adult PT Treatment:                                                DATE: 05/21/23 Therapeutic Exercise: Standing heel raise Rt side x 10 Tandem stance each side x 30 seconds Narrow Stance time 30 seconds x 3 Single leg balance multiple trials each leg unable to do more than 10 seconds Squat at countertop x 10 Leg press 1 plate x 20, double leg Single-leg leg press 1 plate x 10 Hip abduction x 15 standing on foam pad Self Care: Foto, goal check.  [facility] Adult PT Treatment:                                                DATE: 05/15/23 Therapeutic Exercise: NuStep level 5 x 5 mins Slant board stretch 2x30" Heel raise off edge of step 2 x 10  Toe raise Rt foot 2x10  SLS each LE x 2 trials, 8 sec each side Lt LE less stable  Seated DF green band x 15 BIL Calf stretch 2 x 30 sec strap  Seated PF with and without wgt (15 lbs )  x 20  Leg press seated 10, 2 plates x 15 double leg, 1 plate single leg  x 10 each    [facility] Adult PT Treatment:                                                DATE: 05/09/23 Aquatic therapy at [facility]- [address] - therapeutic pool temp approximately 91 degrees. Pt enters building ambulating independently with SPC. Treatment took place in water 3.8 to  4 ft 8 in. deep depending upon activity.  Pt entered and exited the pool  via stair and handrails independently. Patient entered water for aquatic therapy for first time and was introduced to principles and therapeutic effects of water as they ambulated and acclimated to pool.  Aquatic Exercise: Walking forward/backwards/side stepping Lunge stepping forwards x1 lap Side stepping with rainbow DB shoulder abd/add x2 laps Step ups bottom step fwd/lat x10 ea BIL Heel toe raises Standing with UE support edge of pool: Hip abd/add x20 BIL Heel toe raises 2x20 Hip ext/flex with knee straight x 20 BIL Hip Circles CC/CCW x10 each BIL Marching hip flexion to knee extension 2x10 BIL Squats 2x20  Pt requires the buoyancy of water for active assisted exercises with buoyancy supported for strengthening and AROM exercises. Hydrostatic pressure also supports joints by unweighting joint load by at least 50 % in 3-4 feet depth water. 80% in chest to neck deep water. Water will provide assistance with movement using the current and laminar flow while the buoyancy reduces weight bearing. Pt requires the viscosity of the water for resistance with strengthening exercises.   [facility] Adult PT Treatment:                                                DATE: 04/30/23 Therapeutic Exercise: NuStep  Seated DF green band x 15  Calf stretch 3 x 30 sec strap  Seated PF with and without wgt (15 lbs )  x 20  Calf stretch off edge of step 30 sec  Heel raise off edge of step  x 10 , able to do a bit on the Lt LE  Toe raise Rt foot x 15  SLS each LE x 3 trials, , 8 sec each side Rt LE less stable  Leg press 2 plates x 15 double leg 1 plate single leg  x 10 each        PATIENT EDUCATION:  Education details: POC, HEP, balance and weakness  Person educated: Patient Education method: Explanation, Demonstration, Tactile cues, Verbal cues, and Handouts Education comprehension: verbalized understanding and needs further education  HOME EXERCISE PROGRAM: Access Code: [email or web address] URL:  [email or web address] Date: 04/08/2023 Prepared by: Karie Mainland  Exercises - Seated Calf Stretch with Strap  - 1 x daily - 7 x weekly - 1 sets - 10 reps - 30 hold - Seated Ankle Dorsiflexion with Resistance  - 1 x daily - 7 x weekly - 2 sets - 10 reps - 5 hold - Seated Figure 4 Ankle Inversion with Resistance  - 1 x daily - 7 x weekly -  2 sets - 10 reps - 5 hold - Gastroc Stretch on Wall  - 1 x daily - 7 x weekly - 1 sets - 5 reps - 30 hold -balance,  Hip abduction   ASSESSMENT:  CLINICAL IMPRESSION: Patient overall reporting great improvement with physical therapy.  Pain continues to show deficits in balance related to postpolio and left lower extremity.  He has a full home program which includes standing balance exercises.  He feels ready for discharge at this time.  Foto score improved to just 1% below goal and last time.  He plans to join [employer] and use both the gym and the pool.   OBJECTIVE IMPAIRMENTS: Abnormal gait, decreased balance, decreased mobility, difficulty walking, decreased ROM, decreased strength, increased fascial restrictions, impaired sensation, postural dysfunction, and pain.   ACTIVITY LIMITATIONS: lifting, standing, squatting, stairs, transfers, and locomotion level  PARTICIPATION LIMITATIONS: interpersonal relationship, driving, shopping, community activity, and occupation  PERSONAL FACTORS: Past/current experiences and 1-2 comorbidities: Previous foot surgery x 2, left ankle foot deformity and weakness  are also affecting patient's functional outcome.   REHAB POTENTIAL: Excellent  CLINICAL DECISION MAKING: Evolving/moderate complexity  EVALUATION COMPLEXITY: Moderate   GOALS: Goals reviewed with patient? Yes  SHORT TERM GOALS:  2 weeks (04/22/23)     Patient will be independent with basic home program for right lower extremity range of motion and strength Baseline: Given on evaluation Goal status: MET   2.  Patient will complete  balance screening to assess fall risk Baseline: Unknown Goal status: MET- BERG performed 04/23/23  3.  Pt will complete 2-6 min walk test and set goal  Baseline: Unknown  Goal status: performed 04/23/23    LONG TERM GOALS: Target date: 05/20/2023    Pt will be I with HEP for long term health and mobility (including aquatics, if able)  Baseline: unknown, would like to return to aqua fitness  Goal status: MET  2.  Pt will improve FOTO score to 64% or better to demo improved functional mobility  Baseline: 48%, 63% Goal status:nearly MET   3.  Pt will be able to walk 300 feet in the community with cane without increased foot pain  Baseline: limps, boot intermittently and pain is min to mod  Goal status:MET   4.  Pt will return to driving without the boot ([employer]) without increased pain  Baseline: has limited this a great deal  Goal status: MET   5.  Pt will be able to demo Rt ankle DF to +5 deg for better gait and stair negotiation  Baseline: lacks 9 deg  Goal status:NT on DC>   6.  Functional testing, balance goals TBA based on assessment  Baseline: NT  Goal status: NA    PLAN:  PT FREQUENCY: 2x/week  PT DURATION: 6 weeks  PLANNED INTERVENTIONS: Therapeutic exercises, Therapeutic activity, Neuromuscular re-education, Balance training, Gait training, Patient/Family education, Self Care, Joint mobilization, Aquatic Therapy, Cryotherapy, Moist heat, Manual therapy, and Re-evaluation  PLAN FOR NEXT SESSION:DC from PT.    Lanell Persons PT 05/21/23 12:25 PM Phone: [phone] Fax: [phone]

## 2023-05-21 ENCOUNTER — Ambulatory Visit: Payer: Medicare Other | Admitting: Physical Therapy

## 2023-05-21 DIAGNOSIS — M25671 Stiffness of right ankle, not elsewhere classified: Secondary | ICD-10-CM | POA: Diagnosis not present

## 2023-05-21 DIAGNOSIS — M25571 Pain in right ankle and joints of right foot: Secondary | ICD-10-CM | POA: Diagnosis not present

## 2023-05-21 DIAGNOSIS — R262 Difficulty in walking, not elsewhere classified: Secondary | ICD-10-CM

## 2023-05-22 NOTE — Addendum Note (Signed)
Addended by: Karie Mainland L on: 05/22/2023 08:43 AM   Modules accepted: Orders

## 2023-05-23 ENCOUNTER — Ambulatory Visit: Payer: Medicare Other

## 2023-05-28 ENCOUNTER — Ambulatory Visit: Payer: Self-pay

## 2023-05-28 NOTE — Patient Outreach (Signed)
  Care Coordination   Follow Up Visit Note   05/28/2023 Name: Jonathan [name] MRN: [id number] DOB: July 22, 1956  Jonathan [name] is a 67 y.o. year old male who sees Dickie La, MD for primary care. I spoke with  Jonathan [name] by phone today.  What matters to the patients health and wellness today?  I had a discussion with Jonathan [name], who indicated that he monitors his blood pressure intermitantly. During today's check, his reading was 117/69. He reported the absence of any headaches or chest pain and confirmed that he adheres to his prescribed medication regimen consistently.      Goals Addressed             This Visit's Progress    I need to quit smoking       Patient Self Care Activities:  Patient will monitor as a habit during cravings  Patient will commit to reducing tobacco consumption Tobacco abuse of 48 years; currently smoking 1/2 ppd Reports smoking within 30 minutes of waking up Reports triggers to smoke include: N?A Reports motivation to quit smoking includes: health reasons On a scale of 1-10, reports MOTIVATION to quit is 5/10 On a scale of 1-10, reports CONFIDENCE in quitting is 8/10 Provided contact information for [place] [facility]uit [facility] (1-800-QUIT-NOW). Patient will outreach this group for support. Discussed plans with patient for ongoing care management follow up and provided patient with direct contact information for care management team Provided patient with smoking cessation educational materials via mail Reviewed scheduled/upcoming provider appointments including Referred patient to pharmacy team Provided contact information for [place] [facility]uit [facility] (1-800-QUIT-NOW). Objective:  Last practice recorded BP readings:  BP Readings from Last 3 Encounters:  03/26/23 112/71  02/04/23 131/88  02/04/23 131/88   Most recent eGFR/CrCl:  Lab Results  Component Value Date   EGFR 60 03/26/2023    No components found for: "CRCL" Evaluation of current treatment plan related  to hypertension self management and patient's adherence to plan as established by provider. Provided education to patient re: stroke prevention, s/s of heart attack and stroke, DASH diet, complications of uncontrolled blood pressure Advised patient, providing education and rationale, to monitor blood pressure daily and record, calling PCP for findings outside established parameters.  Still continue to check you blood pressure          SDOH assessments and interventions completed:  No     Care Coordination Interventions:  Yes, provided   Interventions Today    Flowsheet Row Most Recent Value  Chronic Disease   Chronic disease during today's visit Hypertension (HTN)  General Interventions   General Interventions Discussed/Reviewed General Interventions Discussed  Education Interventions   Education Provided Provided Education  Pharmacy Interventions   Pharmacy Dicussed/Reviewed Pharmacy Topics Discussed  Safety Interventions   Safety Discussed/Reviewed Safety Discussed            Follow up plan: Follow up call scheduled for 07/02/23   1015 am    Encounter Outcome:  Patient Visit Completed   Juanell Fairly RN, BSN, [facility] [place]  [facility], [facility]  Care Coordinator Phone: [phone]

## 2023-05-28 NOTE — Patient Instructions (Signed)
Visit Information  Thank you for taking time to visit with me today. Please don't hesitate to contact me if I can be of assistance to you.   Following are the goals we discussed today:   Goals Addressed             This Visit's Progress    I need to quit smoking       Patient Self Care Activities:  Patient will monitor as a habit during cravings  Patient will commit to reducing tobacco consumption Tobacco abuse of 48 years; currently smoking 1/2 ppd Reports smoking within 30 minutes of waking up Reports triggers to smoke include: N?A Reports motivation to quit smoking includes: health reasons On a scale of 1-10, reports MOTIVATION to quit is 5/10 On a scale of 1-10, reports CONFIDENCE in quitting is 8/10 Provided contact information for [place] [facility]uit [facility] (1-800-QUIT-NOW). Patient will outreach this group for support. Discussed plans with patient for ongoing care management follow up and provided patient with direct contact information for care management team Provided patient with smoking cessation educational materials via mail Reviewed scheduled/upcoming provider appointments including Referred patient to pharmacy team Provided contact information for [place] [facility]uit [facility] (1-800-QUIT-NOW). Objective:  Last practice recorded BP readings:  BP Readings from Last 3 Encounters:  03/26/23 112/71  02/04/23 131/88  02/04/23 131/88   Most recent eGFR/CrCl:  Lab Results  Component Value Date   EGFR 60 03/26/2023    No components found for: "CRCL" Evaluation of current treatment plan related to hypertension self management and patient's adherence to plan as established by provider. Provided education to patient re: stroke prevention, s/s of heart attack and stroke, DASH diet, complications of uncontrolled blood pressure Advised patient, providing education and rationale, to monitor blood pressure daily and record, calling PCP for findings outside established parameters.  Still continue to check  you blood pressure          Our next appointment is by telephone on 07/02/23 at 1015 am  Please call the care guide team at [phone] if you need to cancel or reschedule your appointment.   If you are experiencing a Mental Health or Behavioral Health Crisis or need someone to talk to, please call 1-800-273-TALK (toll free, 24 hour hotline)  Patient verbalizes understanding of instructions and care plan provided today and agrees to view in MyChart. Active MyChart status and patient understanding of how to access instructions and care plan via MyChart confirmed with patient.

## 2023-06-24 NOTE — Progress Notes (Signed)
06/24/2023 JIMMY WILAND [id number] 28-May-1956   CHIEF COMPLAINT: GERD  HISTORY OF PRESENT ILLNESS:   Past Medical History:  Diagnosis Date   Allergy    Anxiety    Blood transfusion without reported diagnosis    as baby   Chronic Bil Foot Pain 05/27/2011   Lt > Rt - since age 67 2/2 to post polio syndrome  Corns and callosities on both feet  Reconstruction surgery at age 43. Uses a brace on left leg for many years On prn tylenol for pain  No candidate for opiates due to active substance abuse Podiatry and PT (for brace eval) on 04/23/2013     Chronic Bil Foot Pain 05/27/2011   Lt > Rt - since age 77 2/2 to post polio syndrome  Corns and callosities on both feet  Reconstruction surgery at age 68. Uses a brace on left leg for many years On prn tylenol for pain  No candidate for opiates due to active substance abuse Podiatry and PT (for brace eval) on 04/23/2013    Chronic viral hepatitis C (HCC) 06/05/2006   History of blood transfusion in childhood Dx 2010 Reports history of Rx while in [place], unknown yr Elevated LFTs since 2010 and repeat 2014 U/S 2009 - no cirrhosis, repeat in 12/2013 >> normal.  Fibrosure >> pending results  Referred to Hep C clinic 04/23/2013 >> appt in 06/2013 Hep C clinic [phone] Immunity to hep A per labs at Hep C clinic  Started on Hep B series 1 st dose 02/03/2014, 2 dose given 04/23/2014. Last dose about 09/2014.  Follow with Hep C clinic        ERECTILE DYSFUNCTION 06/05/2006   Qualifier: Diagnosis of  By: Janee Morn MD, Daniel     Essential hypertension 06/05/2006   Dx in 2008  Has BP machine at home  Checks erratically  Chronic no show issues       GERD (gastroesophageal reflux disease)    occ s/s   Hypertension    Polysubstance abuse (HCC) 04/23/2013   Cocaine (sniffs powder) and marijuana No IVDU    POST-POLIO SYNDROME 06/05/2006   Hx of polio at age 67 Has spasms in his legs on and off Used Flexeril on and off for spasms 2015 May >> sent to  rehab/PT. rec new leg braces/ortho     Preventative health care 05/27/2011   Colonoscopy July 2019 polyps --> rec'd f/u in 5 years   Past Surgical History:  Procedure Laterality Date   COLONOSCOPY     FRACTURE SURGERY     KNEE ARTHROSCOPY Right    Social History:  Family History:    reports that he has been smoking cigarettes. He has a 25 pack-year smoking history. He has never used smokeless tobacco. He reports current alcohol use of about 21.0 standard drinks of alcohol per week. He reports current drug use. Drug: Marijuana. family history includes Breast cancer in his mother; Cancer in his mother.  No Known Allergies    Outpatient Encounter Medications as of 06/25/2023  Medication Sig   atorvastatin (LIPITOR) 20 MG tablet Take 1 tablet (20 mg total) by mouth daily.   dexamethasone (DECADRON) 1 MG tablet Take 1 tablet by mouth at 11 pm the night prior to blood work. Present for blood work at 8 am the next morning.   gabapentin (NEURONTIN) 300 MG capsule Take 1 capsule (300 mg total) by mouth at bedtime.   nicotine (NICODERM CQ - DOSED IN MG/24 HOURS) 21  mg/24hr patch Place 1 patch (21 mg total) onto the skin daily. (Patient not taking: Reported on 04/08/2023)   nicotine polacrilex (NICORETTE) 4 MG gum Take 1 each (4 mg total) by mouth as needed for smoking cessation (chew 1 piece of gum every 1-2 hours as needed for cravings (maximum 24 pieces/day)). (Patient not taking: Reported on 04/08/2023)   Olmesartan-amLODIPine-HCTZ 40-10-25 MG TABS Take 1 tablet by mouth once daily   pantoprazole (PROTONIX) 40 MG tablet Take 1 tablet by mouth once daily   varenicline (CHANTIX) 0.5 MG tablet Take 1 tablet (0.5 mg) once daily on days 1-3. Then take 1 tablet (0.5 mg) twice daily on days 4-7. Then take two tablets (1 mg) twice daily.   No facility-administered encounter medications on file as of 06/25/2023.     REVIEW OF SYSTEMS:  Gen: Denies fever, sweats or chills. No weight loss.  CV: Denies  chest pain, palpitations or edema. Resp: Denies cough, shortness of breath of hemoptysis.  GI: Denies heartburn, dysphagia, stomach or lower abdominal pain. No diarrhea or constipation.  GU: Denies urinary burning, blood in urine, increased urinary frequency or incontinence. MS: Denies joint pain, muscles aches or weakness. Derm: Denies rash, itchiness, skin lesions or unhealing ulcers. Psych: Denies depression, anxiety, memory loss or confusion. Heme: Denies bruising, easy bleeding. Neuro:  Denies headaches, dizziness or paresthesias. Endo:  Denies any problems with DM, thyroid or adrenal function.  PHYSICAL EXAM: There were no vitals taken for this visit. General: in no acute distress. Head: Normocephalic and atraumatic. Eyes:  Sclerae non-icteric, conjunctive pink. Ears: Normal auditory acuity. Mouth: Dentition intact. No ulcers or lesions.  Neck: Supple, no lymphadenopathy or thyromegaly.  Lungs: Clear bilaterally to auscultation without wheezes, crackles or rhonchi. Heart: Regular rate and rhythm. No murmur, rub or gallop appreciated.  Abdomen: Soft, nontender, nondistended. No masses. No hepatosplenomegaly. Normoactive bowel sounds x 4 quadrants.  Rectal: Deferred. Musculoskeletal: Symmetrical with no gross deformities. Skin: Warm and dry. No rash or lesions on visible extremities. Extremities: No edema. Neurological: Alert oriented x 4, no focal deficits.  Psychological:  Alert and cooperative. Normal mood and affect.  ASSESSMENT AND PLAN:    CC:  Dickie La, MD

## 2023-06-25 ENCOUNTER — Other Ambulatory Visit (INDEPENDENT_AMBULATORY_CARE_PROVIDER_SITE_OTHER): Payer: Medicare Other

## 2023-06-25 ENCOUNTER — Ambulatory Visit: Payer: Medicare Other | Admitting: Nurse Practitioner

## 2023-06-25 ENCOUNTER — Encounter: Payer: Self-pay | Admitting: Nurse Practitioner

## 2023-06-25 VITALS — BP 120/70 | HR 76 | Ht 73.5 in | Wt 172.0 lb

## 2023-06-25 DIAGNOSIS — K219 Gastro-esophageal reflux disease without esophagitis: Secondary | ICD-10-CM

## 2023-06-25 DIAGNOSIS — F1010 Alcohol abuse, uncomplicated: Secondary | ICD-10-CM | POA: Diagnosis not present

## 2023-06-25 DIAGNOSIS — Z860100 Personal history of colon polyps, unspecified: Secondary | ICD-10-CM | POA: Diagnosis not present

## 2023-06-25 DIAGNOSIS — F17210 Nicotine dependence, cigarettes, uncomplicated: Secondary | ICD-10-CM | POA: Diagnosis not present

## 2023-06-25 DIAGNOSIS — Z8619 Personal history of other infectious and parasitic diseases: Secondary | ICD-10-CM | POA: Diagnosis not present

## 2023-06-25 DIAGNOSIS — Z860101 Personal history of adenomatous and serrated colon polyps: Secondary | ICD-10-CM | POA: Diagnosis not present

## 2023-06-25 LAB — COMPREHENSIVE METABOLIC PANEL
ALT: 27 U/L (ref 0–53)
AST: 28 U/L (ref 0–37)
Albumin: 4.3 g/dL (ref 3.5–5.2)
Alkaline Phosphatase: 60 U/L (ref 39–117)
BUN: 37 mg/dL — ABNORMAL HIGH (ref 6–23)
CO2: 27 meq/L (ref 19–32)
Calcium: 10 mg/dL (ref 8.4–10.5)
Chloride: 109 meq/L (ref 96–112)
Creatinine, Ser: 1.72 mg/dL — ABNORMAL HIGH (ref 0.40–1.50)
GFR: 41.15 mL/min — ABNORMAL LOW (ref >60.00)
Glucose, Bld: 93 mg/dL (ref 70–99)
Potassium: 3.8 meq/L (ref 3.5–5.1)
Sodium: 144 meq/L (ref 135–145)
Total Bilirubin: 0.4 mg/dL (ref 0.2–1.2)
Total Protein: 7.4 g/dL (ref 6.0–8.3)

## 2023-06-25 LAB — CBC
HCT: 39.6 % (ref 39.0–52.0)
Hemoglobin: 12.9 g/dL — ABNORMAL LOW (ref 13.0–17.0)
MCHC: 32.5 g/dL (ref 30.0–36.0)
MCV: 92.9 fL (ref 78.0–100.0)
Platelets: 325 10*3/uL (ref 150.0–400.0)
RBC: 4.26 Mil/uL (ref 4.22–5.81)
RDW: 14.2 % (ref 11.5–15.5)
WBC: 7.5 10*3/uL (ref 4.0–10.5)

## 2023-06-25 MED ORDER — FAMOTIDINE 20 MG PO TABS: 20.0000 mg | ORAL_TABLET | Freq: Every day | ORAL | 1 refills | Status: DC

## 2023-06-25 NOTE — Patient Instructions (Addendum)
You have been scheduled for an endoscopy. Please follow written instructions given to you at your visit today.  If you use inhalers (even only as needed), please bring them with you on the day of your procedure. ____________________________________________Your provider has requested that you go to the basement level for lab work before leaving today. Press "B" on the elevator. The lab is located at the first door on the left as you exit the elevator.  Recommend no alcohol.  Encourage smoking cessation.  Stop Pantoprazole.  Due to recent changes in healthcare laws, you may see the results of your imaging and laboratory studies on MyChart before your provider has had a chance to review them.  We understand that in some cases there may be results that are confusing or concerning to you. Not all laboratory results come back in the same time frame and the provider may be waiting for multiple results in order to interpret others.  Please give [place] 48 hours in order for your provider to thoroughly review all the results before contacting the office for clarification of your results.   Thank you for trusting me with your gastrointestinal care!   Alcide Evener, CRNP

## 2023-06-26 NOTE — Progress Notes (Signed)
Jonathan [name], pls contact patient and let him know Dr. Meridee Score reviewed his office visit and he advised for the patient to schedule a colonoscopy at the time of his EGD. Pls schedule patient for a colonoscopy at time of EGD if he is willing to do so. See Dr. Elesa Hacker addendum below. THX.

## 2023-06-26 NOTE — Progress Notes (Signed)
Attending Physician's Attestation   I have reviewed the chart.   I agree with the Advanced Practitioner's note, impression, and recommendations with any updates as below. I would go ahead and move forward with scheduling his colonoscopy.  I will maintain previous recommendation for surveillance and then update him based on current guidelines on what we find during the colonoscopy.  With patient having newer onset GERD symptoms at his age, makes sense to perform an endoscopy at same time to ensure no other reason or etiology for the new symptoms to have occurred.  Thus EGD can be added to colonoscopy date.   Corliss Parish, MD [facility] Advanced Endoscopy Office # [phone]

## 2023-06-27 ENCOUNTER — Other Ambulatory Visit: Payer: Self-pay

## 2023-06-27 DIAGNOSIS — D649 Anemia, unspecified: Secondary | ICD-10-CM

## 2023-06-28 ENCOUNTER — Other Ambulatory Visit: Payer: Self-pay

## 2023-06-28 ENCOUNTER — Telehealth: Payer: Self-pay

## 2023-06-28 DIAGNOSIS — Z1211 Encounter for screening for malignant neoplasm of colon: Secondary | ICD-10-CM

## 2023-06-28 DIAGNOSIS — K219 Gastro-esophageal reflux disease without esophagitis: Secondary | ICD-10-CM

## 2023-06-28 DIAGNOSIS — Z860100 Personal history of colon polyps, unspecified: Secondary | ICD-10-CM

## 2023-06-28 DIAGNOSIS — D649 Anemia, unspecified: Secondary | ICD-10-CM

## 2023-06-28 MED ORDER — NA SULFATE-K SULFATE-MG SULF 17.5-3.13-1.6 GM/177ML PO SOLN: ORAL | 0 refills | Status: DC

## 2023-06-28 NOTE — Telephone Encounter (Signed)
Pt made aware of Dr. Meridee Score recommendations: Colonoscopy was added on to pt EGD.Pt made aware. Ambulatory referral to GI was placed.  Prep was sent to pt pharmacy: Pt made aware. Prep letter was created and sent to pt via my chart. Pt made aware. Pt verbalized understanding with all questions answered.

## 2023-07-02 ENCOUNTER — Ambulatory Visit: Payer: Self-pay

## 2023-07-02 ENCOUNTER — Other Ambulatory Visit: Payer: Self-pay | Admitting: Internal Medicine

## 2023-07-02 DIAGNOSIS — R7989 Other specified abnormal findings of blood chemistry: Secondary | ICD-10-CM

## 2023-07-02 NOTE — Patient Instructions (Signed)
Visit Information  Thank you for taking time to visit with me today. Please don't hesitate to contact me if I can be of assistance to you.   Following are the goals we discussed today:   Goals Addressed             This Visit's Progress    Blood Pressure < 140/90       Track and Manage My Blood Pressure-Hypertension       Patient Goals/Self Care Activities: -Patient/Caregiver will take medications as prescribed   -Patient/Caregiver will attend all scheduled provider appointments -Patient/Caregiver will call pharmacy for medication refills 3-7 days in advance of running out of medications -Patient/Caregiver will call provider office for new concerns or questions  -Patient/Caregiver will focus on medication adherence by taking medications as prescribed   Last practice recorded BP readings:  BP Readings from Last 3 Encounters:  06/25/23 120/70  03/26/23 112/71  02/04/23 131/88   Most recent eGFR/CrCl:  Lab Results  Component Value Date   EGFR 60 03/26/2023    No components found for: "CRCL"  Reviewed medications with patient and discussed importance of compliance Discussed plans with patient for ongoing care management follow up and provided patient with direct contact information for care management team Advised patient, providing education and rationale, to monitor blood pressure daily and record, calling PCP for findings outside established parameters - check blood pressure 3 times per week - write blood pressure results in a log or diary    Why is this important?   You won't feel high blood pressure, but it can still hurt your blood vessels.  High blood pressure can cause heart or kidney problems. It can also cause a stroke.  Making lifestyle changes like losing a little weight or eating less salt will help.  Checking your blood pressure at home and at different times of the day can help to control blood pressure.  If the doctor prescribes medicine remember to take it the  way the doctor ordered.  Call the office if you cannot afford the medicine or if there are questions about it.            Our next appointment is by telephone on 09/04/23 at 930 am  Please call the care guide team at [phone] if you need to cancel or reschedule your appointment.   If you are experiencing a Mental Health or Behavioral Health Crisis or need someone to talk to, please  call 1-800-273-TALK (toll free, 24 hour hotline) Patient verbalizes understanding of instructions and care plan provided today and agrees to view in MyChart. Active MyChart status and patient understanding of how to access instructions and care plan via MyChart confirmed with patient.     Juanell Fairly RN, BSN, [facility]  [facility], [facility]  Care Coordinator Phone: [phone]

## 2023-07-02 NOTE — Patient Outreach (Signed)
Care Coordination   Follow Up Visit Note   07/02/2023 Name: Jonathan LINS Sr. MRN: [id number] DOB: [birth year]  Jonathan Flake Sr. is a 67 y.o. year old male who sees Dickie La, MD for primary care. I spoke with  Jonathan Flake Sr. by phone today.  What matters to the patients health and wellness today?  Jonathan [name] reported that he is in good condition. His blood pressure has been consistently measured at approximately 119/78, and he is compliant with his medication regimen. He indicated that his blood pressure experienced a slight increase around Thanksgiving, attributed to heightened frustration while preparing for the holiday. However, it has since stabilized. Jonathan [name] is  following his prescribed medications, and we will continue to monitor both his blood pressure and his dietary and fluid intake.     Goals Addressed             This Visit's Progress    Blood Pressure < 140/90       Track and Manage My Blood Pressure-Hypertension       Patient Goals/Self Care Activities: -Patient/Caregiver will take medications as prescribed   -Patient/Caregiver will attend all scheduled provider appointments -Patient/Caregiver will call pharmacy for medication refills 3-7 days in advance of running out of medications -Patient/Caregiver will call provider office for new concerns or questions  -Patient/Caregiver will focus on medication adherence by taking medications as prescribed   Last practice recorded BP readings:  BP Readings from Last 3 Encounters:  06/25/23 120/70  03/26/23 112/71  02/04/23 131/88   Most recent eGFR/CrCl:  Lab Results  Component Value Date   EGFR 60 03/26/2023    No components found for: "CRCL"  Reviewed medications with patient and discussed importance of compliance Discussed plans with patient for ongoing care management follow up and provided patient with direct contact information for care management team Advised patient, providing education and  rationale, to monitor blood pressure daily and record, calling PCP for findings outside established parameters - check blood pressure 3 times per week - write blood pressure results in a log or diary    Why is this important?   You won't feel high blood pressure, but it can still hurt your blood vessels.  High blood pressure can cause heart or kidney problems. It can also cause a stroke.  Making lifestyle changes like losing a little weight or eating less salt will help.  Checking your blood pressure at home and at different times of the day can help to control blood pressure.  If the doctor prescribes medicine remember to take it the way the doctor ordered.  Call the office if you cannot afford the medicine or if there are questions about it.            SDOH assessments and interventions completed:  No     Care Coordination Interventions:  Yes, provided   Interventions Today    Flowsheet Row Most Recent Value  Chronic Disease   Chronic disease during today's visit Hypertension (HTN)  General Interventions   General Interventions Discussed/Reviewed General Interventions Discussed  Nutrition Interventions   Nutrition Discussed/Reviewed Decreasing salt  Pharmacy Interventions   Pharmacy Dicussed/Reviewed Pharmacy Topics Discussed  Safety Interventions   Safety Discussed/Reviewed Safety Discussed        Follow up plan: Follow up call scheduled for 09/04/23  930 am    Encounter Outcome:  Patient Visit Completed   Jonathan Fairly RN, BSN, [facility]  [facility], [facility]  Care Coordinator Phone: [phone]

## 2023-07-04 ENCOUNTER — Encounter: Payer: Self-pay | Admitting: Student

## 2023-07-04 ENCOUNTER — Ambulatory Visit (INDEPENDENT_AMBULATORY_CARE_PROVIDER_SITE_OTHER): Payer: Medicare Other | Admitting: Student

## 2023-07-04 VITALS — BP 126/82 | HR 92 | Ht 73.5 in | Wt 176.3 lb

## 2023-07-04 DIAGNOSIS — K219 Gastro-esophageal reflux disease without esophagitis: Secondary | ICD-10-CM | POA: Diagnosis not present

## 2023-07-04 DIAGNOSIS — D239 Other benign neoplasm of skin, unspecified: Secondary | ICD-10-CM

## 2023-07-04 DIAGNOSIS — N1831 Chronic kidney disease, stage 3a: Secondary | ICD-10-CM | POA: Diagnosis not present

## 2023-07-04 DIAGNOSIS — D229 Melanocytic nevi, unspecified: Secondary | ICD-10-CM | POA: Diagnosis not present

## 2023-07-04 DIAGNOSIS — R7989 Other specified abnormal findings of blood chemistry: Secondary | ICD-10-CM

## 2023-07-04 DIAGNOSIS — E278 Other specified disorders of adrenal gland: Secondary | ICD-10-CM | POA: Diagnosis not present

## 2023-07-04 DIAGNOSIS — N179 Acute kidney failure, unspecified: Secondary | ICD-10-CM

## 2023-07-04 NOTE — Assessment & Plan Note (Signed)
Referred to GI in September. Will get EGD and Colonoscopy in February due to GERD with alarm sxs of weight loss and early satiety.   -Follow up with GI

## 2023-07-04 NOTE — Assessment & Plan Note (Signed)
Flesh colored papule on the left medial shin surrounded by hyperpigmented ring with + dimpling sign.   -Reassurance provided

## 2023-07-04 NOTE — Progress Notes (Signed)
CC: Wants to follow up on his kidneys as he was told he had an abnormal value and on what GI said.  HPI:  [name]. is a 67 y.o. male living with a history stated below and presents today for follow up on his kidney function and following up on what GI has said. Please see problem based assessment and plan for additional details.  Past Medical History:  Diagnosis Date   Allergy    Anxiety    Blood transfusion without reported diagnosis    as baby   Chronic Bil Foot Pain 05/27/2011   Lt > Rt - since age 59 2/2 to post polio syndrome  Corns and callosities on both feet  Reconstruction surgery at age 16. Uses a brace on left leg for many years On prn tylenol for pain  No candidate for opiates due to active substance abuse Podiatry and PT (for brace eval) on 04/23/2013     Chronic Bil Foot Pain 05/27/2011   Lt > Rt - since age 25 2/2 to post polio syndrome  Corns and callosities on both feet  Reconstruction surgery at age 60. Uses a brace on left leg for many years On prn tylenol for pain  No candidate for opiates due to active substance abuse Podiatry and PT (for brace eval) on 04/23/2013    Chronic viral hepatitis C (HCC) 06/05/2006   History of blood transfusion in childhood Dx 2010 Reports history of Rx while in [place], unknown yr Elevated LFTs since 2010 and repeat 2014 U/S 2009 - no cirrhosis, repeat in 12/2013 >> normal.  Fibrosure >> pending results  Referred to Hep C clinic 04/23/2013 >> appt in 06/2013 Hep C clinic [phone] Immunity to hep A per labs at Hep C clinic  Started on Hep B series 1 st dose 02/03/2014, 2 dose given 04/23/2014. Last dose about 09/2014.  Follow with Hep C clinic        ERECTILE DYSFUNCTION 06/05/2006   Qualifier: Diagnosis of  By: Janee Morn MD, Daniel     Essential hypertension 06/05/2006   Dx in 2008  Has BP machine at home  Checks erratically  Chronic no show issues       GERD (gastroesophageal reflux disease)    occ s/s   Hypertension     Polysubstance abuse (HCC) 04/23/2013   Cocaine (sniffs powder) and marijuana No IVDU    POST-POLIO SYNDROME 06/05/2006   Hx of polio at age 19 Has spasms in his legs on and off Used Flexeril on and off for spasms 2015 May >> sent to rehab/PT. rec new leg braces/ortho     Preventative health care 05/27/2011   Colonoscopy July 2019 polyps --> rec'd f/u in 5 years    Current Outpatient Medications on File Prior to Visit  Medication Sig Dispense Refill   Na Sulfate-K Sulfate-Mg Sulf 17.5-3.13-1.6 GM/177ML SOLN Take as instructed by your colonoscopy prep instructions: 354 mL 0   atorvastatin (LIPITOR) 20 MG tablet Take 1 tablet (20 mg total) by mouth daily. 90 tablet 3   famotidine (PEPCID) 20 MG tablet Take 1 tablet (20 mg total) by mouth daily. 30 tablet 1   gabapentin (NEURONTIN) 300 MG capsule Take 1 capsule (300 mg total) by mouth at bedtime. (Patient not taking: Reported on 06/25/2023) 90 capsule 3   Multiple Vitamins-Minerals (CENTRUM MINIS MEN 50+ PO) Take 1 tablet by mouth daily.     Olmesartan-amLODIPine-HCTZ 40-10-25 MG TABS Take 1 tablet by mouth once daily 90  tablet 3   pantoprazole (PROTONIX) 40 MG tablet Take 1 tablet by mouth once daily (Patient not taking: Reported on 06/25/2023) 90 tablet 0   No current facility-administered medications on file prior to visit.    Family History  Problem Relation Age of Onset   Breast cancer Mother    Other Father        hx unknown   Colon cancer Neg Hx    Colon polyps Neg Hx     Social History   Socioeconomic History   Marital status: Single    Spouse name: Not on file   Number of children: 4   Years of education: Not on file   Highest education level: Not on file  Occupational History   Not on file  Tobacco Use   Smoking status: Every Day    Current packs/day: 0.50    Average packs/day: 0.5 packs/day for 50.0 years (25.0 ttl pk-yrs)    Types: Cigarettes   Smokeless tobacco: Never   Tobacco comments:    .5 PPD  Vaping Use    Vaping status: Never Used  Substance and Sexual Activity   Alcohol use: Yes    Alcohol/week: 21.0 standard drinks of alcohol    Types: 21 Standard drinks or equivalent per week    Comment: beer everyday   Drug use: Yes    Types: Marijuana    Comment: marijuana   Sexual activity: Not on file  Other Topics Concern   Not on file  Social History Narrative   Right Handed    Lives in a one story home       Does not know his father   Has 2 daughters and 2 sons.   Social Drivers of Corporate investment banker Strain: Low Risk  (02/04/2023)   Overall Financial Resource Strain (CARDIA)    Difficulty of Paying Living Expenses: Not hard at all  Food Insecurity: Food Insecurity Present (05/06/2023)   Hunger Vital Sign    Worried About Running Out of Food in the Last Year: Sometimes true    Ran Out of Food in the Last Year: Sometimes true  Transportation Needs: No Transportation Needs (05/06/2023)   PRAPARE - Administrator, Civil Service (Medical): No    Lack of Transportation (Non-Medical): No  Physical Activity: Inactive (02/04/2023)   Exercise Vital Sign    Days of Exercise per Week: 0 days    Minutes of Exercise per Session: 0 min  Stress: No Stress Concern Present (02/04/2023)   [employer] of Occupational Health - Occupational Stress Questionnaire    Feeling of Stress : Not at all  Social Connections: Socially Isolated (02/04/2023)   Social Connection and Isolation Panel [NHANES]    Frequency of Communication with Friends and Family: More than three times a week    Frequency of Social Gatherings with Friends and Family: Not on file    Attends Religious Services: Never    Database administrator or Organizations: No    Attends Banker Meetings: Never    Marital Status: Divorced  Catering manager Violence: Not At Risk (02/04/2023)   Humiliation, Afraid, Rape, and Kick questionnaire    Fear of Current or Ex-Partner: No    Emotionally Abused: No     Physically Abused: No    Sexually Abused: No   Review of Systems: ROS negative except for what is noted on the assessment and plan.  Vitals:   07/04/23 1329  BP: 126/82  Pulse:  92  Weight: 176 lb 4.8 oz (80 kg)  Height: 6' 1.5" (1.867 m)    Physical Exam: Constitutional: well-appearing, in no acute distress HENT: normocephalic atraumatic, mucous membranes moist Eyes: conjunctiva non-erythematous Cardiovascular: regular rate and rhythm, no m/r/g Pulmonary/Chest: normal work of breathing on room air, lungs clear to auscultation bilaterally Abdominal: soft, non-tender, non-distended MSK: normal bulk and tone Neurological: alert & oriented x 3, no focal deficit Skin: warm and dry Psych: normal mood and behavior  Assessment & Plan:   Patient seen with Dr. Sol Blazing  Stage 3a chronic kidney disease (HCC) Has a Cr of around 1.3 at baseline but on 12/3 it was elevated at 1.7. No lightheadedness or dizziness currently. No falls. However, during thanksgiving he felt dizziness and lightheadedness as well as drowsiness. Some blurriness of vision. His sxs would worsen with position changes. He does drink about 2, 16oz bottles of water however, he also has about 3 shots of alcohol a day. He states he was taking his blood pressure medication that day. Since, his symptoms have improved and mostly back to baseline.   -repeat BMP   Gastroesophageal reflux disease Referred to GI in September. Will get EGD and Colonoscopy in February due to GERD with alarm sxs of weight loss and early satiety.   -Follow up with GI   Adrenal incidentaloma (HCC) Found on imaging, dexamethasone suppression test concerning for cortisol secretion. Referred to Endocrinology for further evaluation. Patient has not heard back.   -Provided information for Endocrinology for patient to call and schedule an appointment  Dermatofibroma Flesh colored papule on the left medial shin surrounded by hyperpigmented ring with +  dimpling sign.   -Reassurance provided   Benign nevus Located on the right shin, with overlying hair. Unchanged for many years. Not growing, itching, bleeding.   -Reassurance provided.  Manuela Neptune, MD [facility], PGY-1 Phone: [phone] Date 07/04/2023 Time 5:05 PM

## 2023-07-04 NOTE — Assessment & Plan Note (Signed)
Has a Cr of around 1.3 at baseline but on 12/3 it was elevated at 1.7. No lightheadedness or dizziness currently. No falls. However, during thanksgiving he felt dizziness and lightheadedness as well as drowsiness. Some blurriness of vision. His sxs would worsen with position changes. He does drink about 2, 16oz bottles of water however, he also has about 3 shots of alcohol a day. He states he was taking his blood pressure medication that day. Since, his symptoms have improved and mostly back to baseline.   -repeat BMP

## 2023-07-04 NOTE — Assessment & Plan Note (Signed)
Found on imaging, dexamethasone suppression test concerning for cortisol secretion. Referred to Endocrinology for further evaluation. Patient has not heard back.   -Provided information for Endocrinology for patient to call and schedule an appointment

## 2023-07-04 NOTE — Patient Instructions (Addendum)
Thank you, Jonathan [name]. for allowing [place] to provide your care today.   Follow up:  4 weeks  or sooner if needed  Remember:   Please drink no more than one drink a day. We can prescribe medications that can help you with your desire to drink alcohol that we can try after you try to see if you can quit on your own.   Please follow up with Endocrinology to get your appointment. Their contact information is below:  [facility] Endocrin[place] Address: [address] [place], [place] [address]   Phone: [phone]  Should you have any questions or concerns please call the internal medicine clinic at [phone].    Manuela Neptune, MD [facility]

## 2023-07-04 NOTE — Assessment & Plan Note (Signed)
Located on the right shin, with overlying hair. Unchanged for many years. Not growing, itching, bleeding.   -Reassurance provided.

## 2023-07-06 LAB — BMP8+ANION GAP
Anion Gap: 15 mmol/L (ref 10.0–18.0)
BUN/Creatinine Ratio: 17 (ref 10–24)
BUN: 23 mg/dL (ref 8–27)
CO2: 22 mmol/L (ref 20–29)
Calcium: 9.6 mg/dL (ref 8.6–10.2)
Chloride: 108 mmol/L — ABNORMAL HIGH (ref 96–106)
Creatinine, Ser: 1.36 mg/dL — ABNORMAL HIGH (ref 0.76–1.27)
Glucose: 91 mg/dL (ref 70–99)
Potassium: 4 mmol/L (ref 3.5–5.2)
Sodium: 145 mmol/L — ABNORMAL HIGH (ref 134–144)
eGFR: 58 mL/min/{1.73_m2} — ABNORMAL LOW (ref >59)

## 2023-07-08 NOTE — Addendum Note (Signed)
Addended by: Dickie La on: 07/08/2023 10:57 AM   Modules accepted: Level of Service

## 2023-07-08 NOTE — Progress Notes (Signed)
Internal Medicine Clinic Attending  I was physically present during the key portions of the resident provided service and participated in the medical decision making of patient's management care. I reviewed pertinent patient test results.  The assessment, diagnosis, and plan were formulated together and I agree with the documentation in the resident's note.  Lau, Grace, MD  

## 2023-07-09 NOTE — Progress Notes (Signed)
Improved Cr noted from GI visit. No changes.

## 2023-07-10 ENCOUNTER — Encounter: Payer: Self-pay | Admitting: Internal Medicine

## 2023-08-17 ENCOUNTER — Other Ambulatory Visit: Payer: Self-pay | Admitting: Nurse Practitioner

## 2023-08-29 ENCOUNTER — Telehealth: Payer: Self-pay

## 2023-08-29 DIAGNOSIS — E278 Other specified disorders of adrenal gland: Secondary | ICD-10-CM

## 2023-08-29 NOTE — Telephone Encounter (Signed)
 Orders placed from provider panel  Orders Placed This Encounter  Procedures   DHEA-sulfate   ACTH    Cortisol   Metanephrines, plasma   Aldosterone + renin activity w/ ratio

## 2023-08-30 ENCOUNTER — Other Ambulatory Visit: Payer: Medicare Other

## 2023-08-30 DIAGNOSIS — E278 Other specified disorders of adrenal gland: Secondary | ICD-10-CM | POA: Diagnosis not present

## 2023-09-04 ENCOUNTER — Encounter: Payer: Self-pay | Admitting: Gastroenterology

## 2023-09-04 ENCOUNTER — Ambulatory Visit: Payer: Medicare Other | Admitting: "Endocrinology

## 2023-09-06 LAB — CORTISOL: Cortisol, Plasma: 18.7 ug/dL

## 2023-09-06 LAB — ALDOSTERONE + RENIN ACTIVITY W/ RATIO
ALDO / PRA Ratio: 8.1 {ratio} (ref 0.9–28.9)
Aldosterone: 5 ng/dL
Renin Activity: 0.62 ng/mL/h (ref 0.25–5.82)

## 2023-09-06 LAB — METANEPHRINES, PLASMA
Metanephrine, Free: 43 pg/mL (ref <=57)
Normetanephrine, Free: 91 pg/mL (ref <=148)
Total Metanephrines-Plasma: 134 pg/mL (ref <=205)

## 2023-09-06 LAB — DHEA-SULFATE: DHEA-SO4: 41 ug/dL (ref 20–217)

## 2023-09-06 LAB — ACTH: C206 ACTH: 14 pg/mL (ref 6–50)

## 2023-09-10 ENCOUNTER — Encounter: Payer: Self-pay | Admitting: Certified Registered Nurse Anesthetist

## 2023-09-13 ENCOUNTER — Telehealth: Payer: Self-pay | Admitting: Gastroenterology

## 2023-09-13 ENCOUNTER — Ambulatory Visit: Payer: Medicare Other | Admitting: Gastroenterology

## 2023-09-13 ENCOUNTER — Encounter: Payer: Medicare Other | Admitting: Gastroenterology

## 2023-09-13 ENCOUNTER — Encounter: Payer: Self-pay | Admitting: Gastroenterology

## 2023-09-13 VITALS — BP 98/74 | HR 73 | Temp 97.9°F | Resp 12 | Ht 73.0 in | Wt 172.0 lb

## 2023-09-13 DIAGNOSIS — K2970 Gastritis, unspecified, without bleeding: Secondary | ICD-10-CM | POA: Diagnosis not present

## 2023-09-13 DIAGNOSIS — K621 Rectal polyp: Secondary | ICD-10-CM | POA: Diagnosis not present

## 2023-09-13 DIAGNOSIS — K635 Polyp of colon: Secondary | ICD-10-CM | POA: Diagnosis not present

## 2023-09-13 DIAGNOSIS — D128 Benign neoplasm of rectum: Secondary | ICD-10-CM | POA: Diagnosis not present

## 2023-09-13 DIAGNOSIS — Z1211 Encounter for screening for malignant neoplasm of colon: Secondary | ICD-10-CM | POA: Diagnosis not present

## 2023-09-13 DIAGNOSIS — K2980 Duodenitis without bleeding: Secondary | ICD-10-CM

## 2023-09-13 DIAGNOSIS — I10 Essential (primary) hypertension: Secondary | ICD-10-CM | POA: Diagnosis not present

## 2023-09-13 DIAGNOSIS — K2950 Unspecified chronic gastritis without bleeding: Secondary | ICD-10-CM | POA: Diagnosis not present

## 2023-09-13 DIAGNOSIS — Z860101 Personal history of adenomatous and serrated colon polyps: Secondary | ICD-10-CM | POA: Diagnosis not present

## 2023-09-13 DIAGNOSIS — Z860100 Personal history of colon polyps, unspecified: Secondary | ICD-10-CM

## 2023-09-13 DIAGNOSIS — D124 Benign neoplasm of descending colon: Secondary | ICD-10-CM | POA: Diagnosis not present

## 2023-09-13 DIAGNOSIS — D125 Benign neoplasm of sigmoid colon: Secondary | ICD-10-CM | POA: Diagnosis not present

## 2023-09-13 DIAGNOSIS — D127 Benign neoplasm of rectosigmoid junction: Secondary | ICD-10-CM | POA: Diagnosis not present

## 2023-09-13 DIAGNOSIS — D120 Benign neoplasm of cecum: Secondary | ICD-10-CM | POA: Diagnosis not present

## 2023-09-13 DIAGNOSIS — K5730 Diverticulosis of large intestine without perforation or abscess without bleeding: Secondary | ICD-10-CM | POA: Diagnosis not present

## 2023-09-13 DIAGNOSIS — D122 Benign neoplasm of ascending colon: Secondary | ICD-10-CM | POA: Diagnosis not present

## 2023-09-13 DIAGNOSIS — K3189 Other diseases of stomach and duodenum: Secondary | ICD-10-CM | POA: Diagnosis not present

## 2023-09-13 DIAGNOSIS — K219 Gastro-esophageal reflux disease without esophagitis: Secondary | ICD-10-CM | POA: Diagnosis not present

## 2023-09-13 DIAGNOSIS — D123 Benign neoplasm of transverse colon: Secondary | ICD-10-CM

## 2023-09-13 DIAGNOSIS — K2289 Other specified disease of esophagus: Secondary | ICD-10-CM | POA: Diagnosis not present

## 2023-09-13 MED ORDER — SODIUM CHLORIDE 0.9 % IV SOLN
500.0000 mL | Freq: Once | INTRAVENOUS | Status: DC
Start: 2023-09-13 — End: 2023-09-13

## 2023-09-13 MED ORDER — PANTOPRAZOLE SODIUM 40 MG PO TBEC: 40.0000 mg | DELAYED_RELEASE_TABLET | Freq: Every day | ORAL | 0 refills | Status: DC

## 2023-09-13 MED ORDER — PANTOPRAZOLE SODIUM 40 MG PO TBEC: 40.0000 mg | DELAYED_RELEASE_TABLET | Freq: Every day | ORAL | 12 refills | Status: AC

## 2023-09-13 NOTE — Progress Notes (Signed)
 [name] states no medical or surgical changes since previsit or office visit.

## 2023-09-13 NOTE — Progress Notes (Signed)
 Called to room to assist during endoscopic procedure.  Patient ID and intended procedure confirmed with present staff. Received instructions for my participation in the procedure from the performing physician.

## 2023-09-13 NOTE — Telephone Encounter (Signed)
Called patient and had to leave a voicemail stating that he could start the second portion of his prep at 11:00 but it would only give him an hour and a half to complete it and to drink the water behind it. I reiterated that he can no longer have anything in his mouth after 12:30 pm. I advised patient to call back if he had any further questions.

## 2023-09-13 NOTE — Progress Notes (Signed)
1518 Robinul 0.1 mg IV given due large amount of secretions upon assessment.  MD made aware, vss

## 2023-09-13 NOTE — Op Note (Signed)
[facility] Patient Name: Jonathan [name] Procedure Date: 09/13/2023 3:14 PM MRN: [id number] Endoscopist: Corliss Parish , MD, [id number] Age: 68 Referring MD:  Date of Birth: July 31, 1955 Gender: Male Account #: [id number] Procedure:                Upper GI endoscopy Indications:              Heartburn Medicines:                Monitored Anesthesia Care Procedure:                Pre-Anesthesia Assessment:                           - Prior to the procedure, a History and Physical                            was performed, and patient medications and                            allergies were reviewed. The patient's tolerance of                            previous anesthesia was also reviewed. The risks                            and benefits of the procedure and the sedation                            options and risks were discussed with the patient.                            All questions were answered, and informed consent                            was obtained. Prior Anticoagulants: The patient has                            taken no anticoagulant or antiplatelet agents. ASA                            Grade Assessment: II - A patient with mild systemic                            disease. After reviewing the risks and benefits,                            the patient was deemed in satisfactory condition to                            undergo the procedure.                           After obtaining informed consent, the endoscope was  passed under direct vision. Throughout the                            procedure, the patient's blood pressure, pulse, and                            oxygen saturations were monitored continuously. The                            Olympus Scope [id number] was introduced through the                            mouth, and advanced to the second part of duodenum.                            The upper GI endoscopy was  accomplished without                            difficulty. The patient tolerated the procedure. Scope In: Scope Out: Findings:                 No gross lesions were noted in the entire                            esophagus. Biopsies were taken with a cold forceps                            for histology to rule out EOE/LOE.                           The Z-line was regular and was found 39 cm from the                            incisors.                           A 1 cm hiatal hernia was present.                           Patchy mild inflammation characterized by erosions                            and erythema was found in the entire examined                            stomach. Biopsies were taken with a cold forceps                            for histology and Helicobacter pylori testing.                           Segmental mild inflammation characterized by                            congestion (  edema), erythema and granularity was                            found in the duodenal bulb, in the first portion of                            the duodenum and in the second portion of the                            duodenum. Biopsies were taken with a cold forceps                            for histology. Complications:            No immediate complications. Estimated Blood Loss:     Estimated blood loss was minimal. Impression:               - No gross lesions in the entire esophagus.                            Biopsied.                           - Z-line regular, 39 cm from the incisors.                           - 1 cm hiatal hernia.                           - Gastritis. Biopsied.                           - Duodenitis. Biopsied. Recommendation:           - Proceed to scheduled colonoscopy.                           - Continue PPI 40 mg once daily (30/12).                           - Observe patient's clinical course.                           - Await pathology results.                            - The findings and recommendations were discussed                            with the patient.                           - The findings and recommendations were discussed                            with the patient's family. Corliss Parish, MD 09/13/2023 4:10:41 PM

## 2023-09-13 NOTE — Patient Instructions (Signed)

## 2023-09-13 NOTE — Progress Notes (Signed)
 Report given to PACU, vss

## 2023-09-13 NOTE — Telephone Encounter (Signed)
Inbound call from patient requesting to know if he is able to second dose of prep today at 11:00 am. Please advise, thank you.

## 2023-09-13 NOTE — Op Note (Signed)
[facility] Patient Name: Jonathan [name] Procedure Date: 09/13/2023 3:14 PM MRN: [id number] Endoscopist: Corliss Parish , MD, [id number] Age: 68 Referring MD:  Date of Birth: [birth year] Gender: Male Account #: [id number] Procedure:                Colonoscopy Indications:              Surveillance: Personal history of adenomatous                            polyps on last colonoscopy > 5 years ago Medicines:                Monitored Anesthesia Care Procedure:                Pre-Anesthesia Assessment:                           - Prior to the procedure, a History and Physical                            was performed, and patient medications and                            allergies were reviewed. The patient's tolerance of                            previous anesthesia was also reviewed. The risks                            and benefits of the procedure and the sedation                            options and risks were discussed with the patient.                            All questions were answered, and informed consent                            was obtained. Prior Anticoagulants: The patient has                            taken no anticoagulant or antiplatelet agents. ASA                            Grade Assessment: II - A patient with mild systemic                            disease. After reviewing the risks and benefits,                            the patient was deemed in satisfactory condition to                            undergo the procedure.  After obtaining informed consent, the colonoscope                            was passed under direct vision. Throughout the                            procedure, the patient's blood pressure, pulse, and                            oxygen saturations were monitored continuously. The                            CF [id number] #[id number] was introduced through the anus                            and advanced to  the the cecum, identified by                            appendiceal orifice and ileocecal valve. The                            colonoscopy was performed without difficulty. The                            patient tolerated the procedure. The quality of the                            bowel preparation was adequate. Scope In: 3:35:37 PM Scope Out: 4:03:32 PM Scope Withdrawal Time: 0 hours 25 minutes 55 seconds  Total Procedure Duration: 0 hours 27 minutes 55 seconds  Findings:                 The digital rectal exam findings include                            hemorrhoids. Pertinent negatives include no                            palpable rectal lesions.                           A large amount of semi-liquid stool was found in                            the entire colon, interfering with visualization.                            Lavage of the area was performed using copious                            amounts, resulting in clearance with adequate                            visualization.  15, sessile and semi-sessile polyps were found in                            the rectum (1), recto-sigmoid colon (1), sigmoid                            colon (1), descending colon (1), transverse colon                            (2), ascending colon (5) and cecum (4). The polyps                            were 3 to 18 mm in size. These polyps were removed                            with a cold snare. Resection and retrieval were                            complete.                           Many medium-mouthed and small-mouthed diverticula                            were found in the entire colon.                           Non-bleeding non-thrombosed external and internal                            hemorrhoids were found during retroflexion, during                            perianal exam and during digital exam. The                            hemorrhoids were Grade II (internal  hemorrhoids                            that prolapse but reduce spontaneously). Complications:            No immediate complications. Estimated Blood Loss:     Estimated blood loss was minimal. Impression:               - Hemorrhoids found on digital rectal exam.                           - Stool in the entire examined colon. Lavaged with                            adequate visualization                           - 15, 3 to 18 mm polyps in the rectum, at the  recto-sigmoid colon, in the sigmoid colon, in the                            descending colon, in the transverse colon, in the                            ascending colon and in the cecum, removed with a                            cold snare. Resected and retrieved.                           - Diverticulosis in the entire examined colon.                           - Non-bleeding non-thrombosed external and internal                            hemorrhoids. Recommendation:           - The patient will be observed post-procedure,                            until all discharge criteria are met.                           - Discharge patient to home.                           - Patient has a contact number available for                            emergencies. The signs and symptoms of potential                            delayed complications were discussed with the                            patient. Return to normal activities tomorrow.                            Written discharge instructions were provided to the                            patient.                           - High fiber diet.                           - Use FiberCon 1-2 tablets PO daily.                           - Minimize nonsteroidal medications as able for  next 1 week to decrease risk of post interventional                            bleeding.                           - Continue present medications.                            - Await pathology results.                           - Repeat colonoscopy in 1 year for surveillance.                            Recommend 2-day preparation.                           - The findings and recommendations were discussed                            with the patient.                           - The findings and recommendations were discussed                            with the patient's family. Corliss Parish, MD 09/13/2023 4:17:02 PM

## 2023-09-13 NOTE — Progress Notes (Signed)
GASTROENTEROLOGY PROCEDURE H&P NOTE   Primary Care Physician: Dickie La, MD  HPI: Jonathan Flake Sr. is a 68 y.o. male who presents for EGD for evaluation of GERD symptoms and Colonoscopy for surveillance prior adenomas.  Past Medical History:  Diagnosis Date   Allergy    Anxiety    Blood transfusion without reported diagnosis    as baby   Chronic Bil Foot Pain 05/27/2011   Lt > Rt - since age 34 2/2 to post polio syndrome  Corns and callosities on both feet  Reconstruction surgery at age 51. Uses a brace on left leg for many years On prn tylenol for pain  No candidate for opiates due to active substance abuse Podiatry and PT (for brace eval) on 04/23/2013     Chronic Bil Foot Pain 05/27/2011   Lt > Rt - since age 40 2/2 to post polio syndrome  Corns and callosities on both feet  Reconstruction surgery at age 58. Uses a brace on left leg for many years On prn tylenol for pain  No candidate for opiates due to active substance abuse Podiatry and PT (for brace eval) on 04/23/2013    Chronic viral hepatitis C (HCC) 06/05/2006   History of blood transfusion in childhood Dx 2010 Reports history of Rx while in [place], unknown yr Elevated LFTs since 2010 and repeat 2014 U/S 2009 - no cirrhosis, repeat in 12/2013 >> normal.  Fibrosure >> pending results  Referred to Hep C clinic 04/23/2013 >> appt in 06/2013 Hep C clinic [phone] Immunity to hep A per labs at Hep C clinic  Started on Hep B series 1 st dose 02/03/2014, 2 dose given 04/23/2014. Last dose about 09/2014.  Follow with Hep C clinic        ERECTILE DYSFUNCTION 06/05/2006   Qualifier: Diagnosis of  By: Janee Morn MD, Daniel     Essential hypertension 06/05/2006   Dx in 2008  Has BP machine at home  Checks erratically  Chronic no show issues       GERD (gastroesophageal reflux disease)    occ s/s   Hypertension    Polysubstance abuse (HCC) 04/23/2013   Cocaine (sniffs powder) and marijuana No IVDU    POST-POLIO SYNDROME 06/05/2006    Hx of polio at age 51 Has spasms in his legs on and off Used Flexeril on and off for spasms 2015 May >> sent to rehab/PT. rec new leg braces/ortho     Preventative health care 05/27/2011   Colonoscopy July 2019 polyps --> rec'd f/u in 5 years   Past Surgical History:  Procedure Laterality Date   COLONOSCOPY     KNEE ARTHROSCOPY Right    Current Outpatient Medications  Medication Sig Dispense Refill   Na Sulfate-K Sulfate-Mg Sulf 17.5-3.13-1.6 GM/177ML SOLN Take as instructed by your colonoscopy prep instructions: 354 mL 0   atorvastatin (LIPITOR) 20 MG tablet Take 1 tablet (20 mg total) by mouth daily. 90 tablet 3   famotidine (PEPCID) 20 MG tablet Take 1 tablet by mouth once daily 30 tablet 2   gabapentin (NEURONTIN) 300 MG capsule Take 1 capsule (300 mg total) by mouth at bedtime. (Patient not taking: Reported on 06/25/2023) 90 capsule 3   Multiple Vitamins-Minerals (CENTRUM MINIS MEN 50+ PO) Take 1 tablet by mouth daily.     Olmesartan-amLODIPine-HCTZ 40-10-25 MG TABS Take 1 tablet by mouth once daily 90 tablet 3   pantoprazole (PROTONIX) 40 MG tablet Take 1 tablet by mouth once daily (Patient not  taking: Reported on 06/25/2023) 90 tablet 0   No current facility-administered medications for this visit.    Current Outpatient Medications:    Na Sulfate-K Sulfate-Mg Sulf 17.5-3.13-1.6 GM/177ML SOLN, Take as instructed by your colonoscopy prep instructions:, Disp: 354 mL, Rfl: 0   atorvastatin (LIPITOR) 20 MG tablet, Take 1 tablet (20 mg total) by mouth daily., Disp: 90 tablet, Rfl: 3   famotidine (PEPCID) 20 MG tablet, Take 1 tablet by mouth once daily, Disp: 30 tablet, Rfl: 2   gabapentin (NEURONTIN) 300 MG capsule, Take 1 capsule (300 mg total) by mouth at bedtime. (Patient not taking: Reported on 06/25/2023), Disp: 90 capsule, Rfl: 3   Multiple Vitamins-Minerals (CENTRUM MINIS MEN 50+ PO), Take 1 tablet by mouth daily., Disp: , Rfl:    Olmesartan-amLODIPine-HCTZ 40-10-25 MG TABS, Take 1  tablet by mouth once daily, Disp: 90 tablet, Rfl: 3   pantoprazole (PROTONIX) 40 MG tablet, Take 1 tablet by mouth once daily (Patient not taking: Reported on 06/25/2023), Disp: 90 tablet, Rfl: 0 No Known Allergies Family History  Problem Relation Age of Onset   Breast cancer Mother    Other Father        hx unknown   Colon cancer Neg Hx    Colon polyps Neg Hx    Social History   Socioeconomic History   Marital status: Single    Spouse name: Not on file   Number of children: 4   Years of education: Not on file   Highest education level: Not on file  Occupational History   Not on file  Tobacco Use   Smoking status: Every Day    Current packs/day: 0.50    Average packs/day: 0.5 packs/day for 50.0 years (25.0 ttl pk-yrs)    Types: Cigarettes   Smokeless tobacco: Never   Tobacco comments:    .5 PPD  Vaping Use   Vaping status: Never Used  Substance and Sexual Activity   Alcohol use: Yes    Alcohol/week: 21.0 standard drinks of alcohol    Types: 21 Standard drinks or equivalent per week    Comment: beer everyday   Drug use: Yes    Types: Marijuana    Comment: marijuana   Sexual activity: Not on file  Other Topics Concern   Not on file  Social History Narrative   Right Handed    Lives in a one story home       Does not know his father   Has 2 daughters and 2 sons.   Social Drivers of Corporate investment banker Strain: Low Risk  (02/04/2023)   Overall Financial Resource Strain (CARDIA)    Difficulty of Paying Living Expenses: Not hard at all  Food Insecurity: Food Insecurity Present (05/06/2023)   Hunger Vital Sign    Worried About Running Out of Food in the Last Year: Sometimes true    Ran Out of Food in the Last Year: Sometimes true  Transportation Needs: No Transportation Needs (05/06/2023)   PRAPARE - Administrator, Civil Service (Medical): No    Lack of Transportation (Non-Medical): No  Physical Activity: Inactive (02/04/2023)   Exercise Vital  Sign    Days of Exercise per Week: 0 days    Minutes of Exercise per Session: 0 min  Stress: No Stress Concern Present (02/04/2023)   [employer] of Occupational Health - Occupational Stress Questionnaire    Feeling of Stress : Not at all  Social Connections: Socially Isolated (02/04/2023)   Social  Connection and Isolation Panel [NHANES]    Frequency of Communication with Friends and Family: More than three times a week    Frequency of Social Gatherings with Friends and Family: Not on file    Attends Religious Services: Never    Active Member of Clubs or Organizations: No    Attends Banker Meetings: Never    Marital Status: Divorced  Catering manager Violence: Not At Risk (02/04/2023)   Humiliation, Afraid, Rape, and Kick questionnaire    Fear of Current or Ex-Partner: No    Emotionally Abused: No    Physically Abused: No    Sexually Abused: No    Physical Exam: There were no vitals filed for this visit. There is no height or weight on file to calculate BMI. GEN: NAD EYE: Sclerae anicteric ENT: MMM CV: Non-tachycardic GI: Soft, NT/ND NEURO:  Alert & Oriented x 3  Lab Results: No results for input(s): "WBC", "HGB", "HCT", "PLT" in the last 72 hours. BMET No results for input(s): "NA", "K", "CL", "CO2", "GLUCOSE", "BUN", "CREATININE", "CALCIUM" in the last 72 hours. LFT No results for input(s): "PROT", "ALBUMIN", "AST", "ALT", "ALKPHOS", "BILITOT", "BILIDIR", "IBILI" in the last 72 hours. PT/INR No results for input(s): "LABPROT", "INR" in the last 72 hours.   Impression / Plan: This is a 68 y.o.male who presents for EGD for evaluation of GERD symptoms and Colonoscopy for surveillance prior adenomas.  The risks and benefits of endoscopic evaluation/treatment were discussed with the patient and/or family; these include but are not limited to the risk of perforation, infection, bleeding, missed lesions, lack of diagnosis, severe illness requiring  hospitalization, as well as anesthesia and sedation related illnesses.  The patient's history has been reviewed, patient examined, no change in status, and deemed stable for procedure.  The patient and/or family is agreeable to proceed.    Corliss Parish, MD [facility] Office # [phone]

## 2023-09-16 ENCOUNTER — Telehealth: Payer: Self-pay

## 2023-09-16 NOTE — Telephone Encounter (Signed)
  Follow up Call-     09/13/2023    2:50 PM  Call back number  Post procedure Call Back phone  # [phone]  Permission to leave phone message Yes     Patient questions:  Do you have a fever, pain , or abdominal swelling? No. Pain Score  0 *  Have you tolerated food without any problems? Yes.    Have you been able to return to your normal activities? Yes.    Do you have any questions about your discharge instructions: Diet   No. Medications  No. Follow up visit  No.  Do you have questions or concerns about your Care? No.  Actions: * If pain score is 4 or above: No action needed, pain <4.

## 2023-09-17 ENCOUNTER — Ambulatory Visit: Payer: Self-pay

## 2023-09-17 NOTE — Patient Instructions (Signed)
 Visit Information  Thank you for taking time to visit with me today. Please don't hesitate to contact me if I can be of assistance to you.   Following are the goals we discussed today:   Goals Addressed             This Visit's Progress    I need to quit smoking       Patient Self Care Activities:  Patient will monitor as a habit during cravings  Patient will commit to reducing tobacco consumption Tobacco abuse of 48 years; currently smoking 1/2 ppd Reports smoking within 30 minutes of waking up Reports triggers to smoke include: N?A Reports motivation to quit smoking includes: health reasons On a scale of 1-10, reports MOTIVATION to quit is 5/10 On a scale of 1-10, reports CONFIDENCE in quitting is 8/10 Provided contact information for [place] [facility] (1-800-QUIT-NOW). Patient will outreach this group for support. Discussed plans with patient for ongoing care management follow up and provided patient with direct contact information for care management team Provided patient with smoking cessation educational materials via mail Reviewed scheduled/upcoming provider appointments including Referred patient to pharmacy team Provided contact information for [place] [facility] (1-800-QUIT-NOW). Objective:  Last practice recorded BP readings:  BP Readings from Last 3 Encounters:  09/13/23 98/74  07/04/23 126/82  06/25/23 120/70   Most recent eGFR/CrCl:  Lab Results  Component Value Date   EGFR 58 (L) 07/04/2023    No components found for: "CRCL" Evaluation of current treatment plan related to hypertension self management and patient's adherence to plan as established by provider. Provided education to patient re: stroke prevention, s/s of heart attack and stroke, DASH diet, complications of uncontrolled blood pressure Advised patient, providing education and rationale, to monitor blood pressure daily and record, calling PCP for findings outside established parameters.  Still continue to  check you blood pressure       COMPLETED: Track and Manage My Blood Pressure-Hypertension       Patient Goals/Self Care Activities: -Patient/Caregiver will take medications as prescribed   -Patient/Caregiver will attend all scheduled provider appointments -Patient/Caregiver will call pharmacy for medication refills 3-7 days in advance of running out of medications -Patient/Caregiver will call provider office for new concerns or questions  -Patient/Caregiver will focus on medication adherence by taking medications as prescribed   Last practice recorded BP readings:  BP Readings from Last 3 Encounters:  09/13/23 98/74  07/04/23 126/82  06/25/23 120/70   Most recent eGFR/CrCl:  Lab Results  Component Value Date   EGFR 58 (L) 07/04/2023    No components found for: "CRCL"  Reviewed medications with patient and discussed importance of compliance Discussed plans with patient for ongoing care management follow up and provided patient with direct contact information for care management team Advised patient, providing education and rationale, to monitor blood pressure daily and record, calling PCP for findings outside established parameters - check blood pressure 3 times per week - write blood pressure results in a log or diary    Why is this important?   You won't feel high blood pressure, but it can still hurt your blood vessels.  High blood pressure can cause heart or kidney problems. It can also cause a stroke.  Making lifestyle changes like losing a little weight or eating less salt will help.  Checking your blood pressure at home and at different times of the day can help to control blood pressure.  If the doctor prescribes medicine remember to take  it the way the doctor ordered.  Call the office if you cannot afford the medicine or if there are questions about it.             If you are experiencing a Mental Health or Behavioral Health Crisis or need someone to talk to, please  call 1-800-273-TALK (toll free, 24 hour hotline)  Patient verbalizes understanding of instructions and care plan provided today and agrees to view in MyChart. Active MyChart status and patient understanding of how to access instructions and care plan via MyChart confirmed with patient.     Juanell Fairly RN, BSN, [facility] [place]  [facility], [facility]  Care Coordinator Phone: [phone]

## 2023-09-17 NOTE — Patient Outreach (Signed)
 Care Coordination   Follow Up Visit Note   09/17/2023 Name: Jonathan KLEINMAN Sr. MRN: [id number] DOB: [birth year]  Jonathan Flake Sr. is a 68 y.o. year old male who sees Dickie La, MD for primary care. I spoke with  Jonathan Flake Sr. by phone today.  What matters to the patients health and wellness today?  I spoke with Jonathan [name], and he reported that his blood pressure has been stable, averaging around 117/79, with no issues. He is consistently taking his medications daily. He recently underwent a colonoscopy, which went well. Jonathan [name] is still working on quitting smoking and is seeking an affordable medication through his insurance. I advised him to contact his insurance provider to determine what they cover and then inform his physician so that a prescription can be written for the medication. He agreed that this is a good approach. Overall, Jonathan [name] blood pressure management is going well, and he no longer requires my assistance at this time. Today will be our last call, but he knows that if he needs help in the future, he can always inform his physician, and I will be happy to assist.    Goals Addressed             This Visit's Progress    Track and Manage My Blood Pressure-Hypertension       Patient Goals/Self Care Activities: -Patient/Caregiver will take medications as prescribed   -Patient/Caregiver will attend all scheduled provider appointments -Patient/Caregiver will call pharmacy for medication refills 3-7 days in advance of running out of medications -Patient/Caregiver will call provider office for new concerns or questions  -Patient/Caregiver will focus on medication adherence by taking medications as prescribed   Last practice recorded BP readings:  BP Readings from Last 3 Encounters:  09/13/23 98/74  07/04/23 126/82  06/25/23 120/70   Most recent eGFR/CrCl:  Lab Results  Component Value Date   EGFR 58 (L) 07/04/2023    No components found for:  "CRCL"  Reviewed medications with patient and discussed importance of compliance Discussed plans with patient for ongoing care management follow up and provided patient with direct contact information for care management team Advised patient, providing education and rationale, to monitor blood pressure daily and record, calling PCP for findings outside established parameters - check blood pressure 3 times per week - write blood pressure results in a log or diary    Why is this important?   You won't feel high blood pressure, but it can still hurt your blood vessels.  High blood pressure can cause heart or kidney problems. It can also cause a stroke.  Making lifestyle changes like losing a little weight or eating less salt will help.  Checking your blood pressure at home and at different times of the day can help to control blood pressure.  If the doctor prescribes medicine remember to take it the way the doctor ordered.  Call the office if you cannot afford the medicine or if there are questions about it.            SDOH assessments and interventions completed:  No     Care Coordination Interventions:  Yes, provided   Interventions Today    Flowsheet Row Most Recent Value  Chronic Disease   Chronic disease during today's visit Hypertension (HTN)  General Interventions   General Interventions Discussed/Reviewed General Interventions Discussed  Exercise Interventions   Exercise Discussed/Reviewed Exercise Discussed  Education Interventions   Education Provided Provided Education  Pharmacy  Interventions   Pharmacy Dicussed/Reviewed Pharmacy Topics Discussed  Safety Interventions   Safety Discussed/Reviewed Safety Discussed        Follow up plan: No further intervention required.   Encounter Outcome:  Patient Visit Completed    Juanell Fairly RN, BSN, [facility]  [facility], [facility]  Care Coordinator Phone: [phone]

## 2023-09-18 LAB — SURGICAL PATHOLOGY

## 2023-09-24 ENCOUNTER — Encounter: Payer: Self-pay | Admitting: Gastroenterology

## 2023-10-23 ENCOUNTER — Other Ambulatory Visit: Payer: Self-pay | Admitting: Internal Medicine

## 2023-10-23 NOTE — Telephone Encounter (Signed)
 Medication sent to pharmacy

## 2023-11-11 ENCOUNTER — Ambulatory Visit: Payer: Medicare Other | Admitting: "Endocrinology

## 2023-11-15 ENCOUNTER — Other Ambulatory Visit: Payer: Self-pay | Admitting: Nurse Practitioner

## 2023-11-26 ENCOUNTER — Other Ambulatory Visit: Payer: Self-pay | Admitting: Internal Medicine

## 2023-11-26 DIAGNOSIS — E785 Hyperlipidemia, unspecified: Secondary | ICD-10-CM

## 2023-11-26 NOTE — Telephone Encounter (Signed)
 Medication sent to pharmacy

## 2024-01-03 ENCOUNTER — Ambulatory Visit: Admitting: "Endocrinology

## 2024-01-03 ENCOUNTER — Encounter: Payer: Self-pay | Admitting: "Endocrinology

## 2024-01-03 VITALS — BP 130/80 | HR 85 | Ht 73.0 in | Wt 171.0 lb

## 2024-01-03 DIAGNOSIS — E278 Other specified disorders of adrenal gland: Secondary | ICD-10-CM

## 2024-01-03 MED ORDER — DEXAMETHASONE 1 MG PO TABS: 1.0000 mg | ORAL_TABLET | Freq: Once | ORAL | 0 refills | Status: AC

## 2024-01-03 NOTE — Patient Instructions (Signed)
 Take one 1 mg dexamethasone  pill at 11 pm any day but must follow with blood work next morning of the pill at 8 am. Timings cannot be changed. I send the medication to pharmacy and ordered blood work. Thanks

## 2024-01-03 NOTE — Progress Notes (Signed)
 Outpatient Endocrinology Note Jonathan Newcomer, MD    Jonathan [name] Sr. 12/11/1955 [id number]  Referring Provider: Bevelyn Bryant, MD Primary Care Provider: Bevelyn Bryant, MD Reason for consultation: Subjective   Assessment & Plan  Diagnoses and all orders for this visit:  Adrenal incidentaloma (HCC) -     Cortisol; Future -     Dexamethasone , blood; Future  Other orders -     dexamethasone  (DECADRON ) 1 MG tablet; Take 1 tablet (1 mg total) by mouth once for 1 dose. Take at 11 pm followed by blood work next morning at 8 am. Timings are specific.  Adrenal incidentaloma found on CT in 2024 Known cushingoid features, not on cortisol, no known history of hypokalemia 04/05/23 CT CHEST WITHOUT CONTRAST: Upper Abdomen: 13 mm left adrenal nodule has low density compatible with adenoma. No followup imaging is recommended. 08/2023 Baseline adrenal labs WNL  Ordered 1 mg dexamethasone  suppression test   Return in about 1 year (around 01/02/2025) for visit in 1 year, 8 am labs next week and labs before next visit in 1 year.   I have reviewed current medications, nurse's notes, allergies, vital signs, past medical and surgical history, family medical history, and social history for this encounter. Counseled patient on symptoms, examination findings, lab findings, imaging results, treatment decisions and monitoring and prognosis. The patient understood the recommendations and agrees with the treatment plan. All questions regarding treatment plan were fully answered.  Jonathan Newcomer, MD  01/03/24   History of Present Illness HPI  Jonathan COZART Sr. is a 68 y.o. male  referred by Dr. Ancil Balzarine for evaluation and management of adrenal incidentaloma.   04/05/23 CT CHEST WITHOUT CONTRAST: Upper Abdomen: 13 mm left adrenal nodule has low density compatible with adenoma. No followup imaging is recommended.  He weight change No moon face No fat pads No increased girth No plethora  No hyperpigmentation No purple striae No proximal muscle weakness No acne No scalp hair loss Yes a history of HTN a history of hypokalemia No paroxysmal episodes of anxiety Yes, some tremors Yes, some lightheadedness Yes, some headache Yes, some palpitation Yes, some sweating No blurry vision Yes, some  Component     Latest Ref Rng 08/30/2023  Metanephrine, Pl     <=57 pg/mL 43   Normetanephrine, Pl     <=148 pg/mL 91   Total Metanephrines-Plasma     <=205 pg/mL 134   ALDOSTERONE      ng/dL 5   Renin Activity     0.25 - 5.82 ng/mL/h 0.62   ALDO / PRA Ratio     0.9 - 28.9 Ratio 8.1   DHEA-SO4     20 - 217 mcg/dL 41   W098 ACTH      6 - 50 pg/mL 14   Cortisol, Plasma     mcg/dL 11.9     Physical Exam  BP 130/80   Pulse 85   Ht 6' 1 (1.854 m)   Wt 171 lb (77.6 kg)   SpO2 98%   BMI 22.56 kg/m    Constitutional: well developed, well nourished Head: normocephalic, atraumatic Eyes: sclera anicteric, no redness Neck: supple Lungs: normal respiratory effort Neurology: alert and oriented Skin: dry, no appreciable rashes Musculoskeletal: no appreciable defects Psychiatric: normal mood and affect   Current Medications Patient's Medications  New Prescriptions   DEXAMETHASONE  (DECADRON ) 1 MG TABLET    Take 1 tablet (1 mg total) by mouth once for 1 dose. Take at 11 pm followed  by blood work next morning at 8 am. Timings are specific.  Previous Medications   ATORVASTATIN  (LIPITOR) 20 MG TABLET    Take 1 tablet by mouth once daily   FAMOTIDINE  (PEPCID ) 20 MG TABLET    Take 1 tablet by mouth once daily   FLUZONE HIGH-DOSE 0.5 ML INJECTION       GABAPENTIN  (NEURONTIN ) 300 MG CAPSULE    Take 1 capsule (300 mg total) by mouth at bedtime.   MULTIPLE VITAMINS-MINERALS (CENTRUM MINIS MEN 50+ PO)    Take 1 tablet by mouth daily.   OLMESARTAN -AMLODIPINE -HCTZ 40-10-25 MG TABS    Take 1 tablet by mouth once daily   PANTOPRAZOLE  (PROTONIX ) 40 MG TABLET    Take 1 tablet (40 mg  total) by mouth daily.   PANTOPRAZOLE  (PROTONIX ) 40 MG TABLET    Take 1 tablet (40 mg total) by mouth daily.  Modified Medications   No medications on file  Discontinued Medications   No medications on file    Allergies No Known Allergies  Past Medical History Past Medical History:  Diagnosis Date   Allergy    Anxiety    Blood transfusion without reported diagnosis    as baby   Chronic Bil Foot Pain 05/27/2011   Lt > Rt - since age 70 2/2 to post polio syndrome  Corns and callosities on both feet  Reconstruction surgery at age 80. Uses a brace on left leg for many years On prn tylenol  for pain  No candidate for opiates due to active substance abuse Podiatry and PT (for brace eval) on 04/23/2013     Chronic Bil Foot Pain 05/27/2011   Lt > Rt - since age 50 2/2 to post polio syndrome  Corns and callosities on both feet  Reconstruction surgery at age 89. Uses a brace on left leg for many years On prn tylenol  for pain  No candidate for opiates due to active substance abuse Podiatry and PT (for brace eval) on 04/23/2013    Chronic viral hepatitis C (HCC) 06/05/2006   History of blood transfusion in childhood Dx 2010 Reports history of Rx while in [place], unknown yr Elevated LFTs since 2010 and repeat 2014 U/S 2009 - no cirrhosis, repeat in 12/2013 >> normal.  Fibrosure >> pending results  Referred to Hep C clinic 04/23/2013 >> appt in 06/2013 Hep C clinic [phone] Immunity to hep A per labs at Hep C clinic  Started on Hep B series 1 st dose 02/03/2014, 2 dose given 04/23/2014. Last dose about 09/2014.  Follow with Hep C clinic        ERECTILE DYSFUNCTION 06/05/2006   Qualifier: Diagnosis of  By: Hildy Lowers MD, Daniel     Essential hypertension 06/05/2006   Dx in 2008  Has BP machine at home  Checks erratically  Chronic no show issues       GERD (gastroesophageal reflux disease)    occ s/s   Hypertension    Polysubstance abuse (HCC) 04/23/2013   Cocaine  (sniffs powder) and marijuana No IVDU     POST-POLIO SYNDROME 06/05/2006   Hx of polio at age 28 Has spasms in his legs on and off Used Flexeril  on and off for spasms 2015 May >> sent to rehab/PT. rec new leg braces/ortho     Preventative health care 05/27/2011   Colonoscopy July 2019 polyps --> rec'd f/u in 5 years    Past Surgical History Past Surgical History:  Procedure Laterality Date   COLONOSCOPY  KNEE ARTHROSCOPY Right     Family History family history includes Breast cancer in his mother; Other in his father.  Social History Social History   Socioeconomic History   Marital status: Single    Spouse name: Not on file   Number of children: 4   Years of education: Not on file   Highest education level: Not on file  Occupational History   Not on file  Tobacco Use   Smoking status: Every Day    Current packs/day: 0.50    Average packs/day: 0.5 packs/day for 50.0 years (25.0 ttl pk-yrs)    Types: Cigarettes   Smokeless tobacco: Never   Tobacco comments:    .5 PPD  Vaping Use   Vaping status: Never Used  Substance and Sexual Activity   Alcohol use: Yes    Alcohol/week: 21.0 standard drinks of alcohol    Types: 21 Standard drinks or equivalent per week    Comment: beer everyday   Drug use: Yes    Types: Marijuana    Comment: marijuana   Sexual activity: Not on file  Other Topics Concern   Not on file  Social History Narrative   Right Handed    Lives in a one story home       Does not know his father   Has 2 daughters and 2 sons.   Social Drivers of Corporate investment banker Strain: Low Risk  (02/04/2023)   Overall Financial Resource Strain (CARDIA)    Difficulty of Paying Living Expenses: Not hard at all  Food Insecurity: Food Insecurity Present (05/06/2023)   Hunger Vital Sign    Worried About Running Out of Food in the Last Year: Sometimes true    Ran Out of Food in the Last Year: Sometimes true  Transportation Needs: No Transportation Needs (05/06/2023)   PRAPARE - Therapist, art (Medical): No    Lack of Transportation (Non-Medical): No  Physical Activity: Inactive (02/04/2023)   Exercise Vital Sign    Days of Exercise per Week: 0 days    Minutes of Exercise per Session: 0 min  Stress: No Stress Concern Present (02/04/2023)   [employer] of Occupational Health - Occupational Stress Questionnaire    Feeling of Stress : Not at all  Social Connections: Socially Isolated (02/04/2023)   Social Connection and Isolation Panel    Frequency of Communication with Friends and Family: More than three times a week    Frequency of Social Gatherings with Friends and Family: Not on file    Attends Religious Services: Never    Active Member of Clubs or Organizations: No    Attends Banker Meetings: Never    Marital Status: Divorced  Catering manager Violence: Not At Risk (02/04/2023)   Humiliation, Afraid, Rape, and Kick questionnaire    Fear of Current or Ex-Partner: No    Emotionally Abused: No    Physically Abused: No    Sexually Abused: No    Lab Results  Component Value Date   CHOL 119 10/08/2022   Lab Results  Component Value Date   HDL 53 10/08/2022   Lab Results  Component Value Date   LDLCALC 43 10/08/2022   Lab Results  Component Value Date   TRIG 130 10/08/2022   Lab Results  Component Value Date   CHOLHDL 2.2 10/08/2022   Lab Results  Component Value Date   CREATININE 1.36 (H) 07/04/2023   Lab Results  Component Value Date  GFR 41.15 (L) 06/25/2023      Component Value Date/Time   NA 145 (H) 07/04/2023 1418   K 4.0 07/04/2023 1418   CL 108 (H) 07/04/2023 1418   CO2 22 07/04/2023 1418   GLUCOSE 91 07/04/2023 1418   GLUCOSE 93 06/25/2023 1228   BUN 23 07/04/2023 1418   CREATININE 1.36 (H) 07/04/2023 1418   CREATININE 1.37 (H) 05/14/2016 1002   CALCIUM  9.6 07/04/2023 1418   PROT 7.4 06/25/2023 1228   PROT 6.7 03/26/2023 1045   ALBUMIN 4.3 06/25/2023 1228   ALBUMIN 4.3 03/26/2023 1045   AST 28  06/25/2023 1228   ALT 27 06/25/2023 1228   ALT 63 (H) 01/25/2016 1552   ALKPHOS 60 06/25/2023 1228   BILITOT 0.4 06/25/2023 1228   BILITOT 0.4 03/26/2023 1045   GFRNONAA 50 (L) 06/22/2020 1443   GFRNONAA 56 (L) 05/14/2016 1002   GFRAA 57 (L) 06/22/2020 1443   GFRAA 65 05/14/2016 1002      Latest Ref Rng & Units 07/04/2023    2:18 PM 06/25/2023   12:28 PM 03/26/2023   10:45 AM  BMP  Glucose 70 - 99 mg/dL 91  93  161   BUN 8 - 27 mg/dL 23  37  16   Creatinine 0.76 - 1.27 mg/dL 0.96  0.45  4.09   BUN/Creat Ratio 10 - 24 17   12    Sodium 134 - 144 mmol/L 145  144  146   Potassium 3.5 - 5.2 mmol/L 4.0  3.8  3.6   Chloride 96 - 106 mmol/L 108  109  109   CO2 20 - 29 mmol/L 22  27  21    Calcium  8.6 - 10.2 mg/dL 9.6  81.1  9.5        Component Value Date/Time   WBC 7.5 06/25/2023 1228   RBC 4.26 06/25/2023 1228   HGB 12.9 (L) 06/25/2023 1228   HGB 13.3 04/19/2021 1006   HCT 39.6 06/25/2023 1228   HCT 38.9 04/19/2021 1006   PLT 325.0 06/25/2023 1228   PLT 303 04/19/2021 1006   MCV 92.9 06/25/2023 1228   MCV 87 04/19/2021 1006   MCH 29.9 04/19/2021 1006   MCH 30.6 03/24/2011 1330   MCHC 32.5 06/25/2023 1228   RDW 14.2 06/25/2023 1228   RDW 12.8 04/19/2021 1006   LYMPHSABS 1.8 12/16/2015 1419   MONOABS 0.5 03/24/2011 1330   EOSABS 0.1 12/16/2015 1419   BASOSABS 0.0 12/16/2015 1419   Lab Results  Component Value Date   TSH 0.902 11/03/2020   TSH 1.717 11/02/2010   TSH 2.063 06/29/2008         Parts of this note may have been dictated using voice recognition software. There may be variances in spelling and vocabulary which are unintentional. Not all errors are proofread. Please notify the [facility] if any discrepancies are noted or if the meaning of any statement is not clear.

## 2024-01-07 ENCOUNTER — Ambulatory Visit: Payer: Self-pay

## 2024-01-07 NOTE — Telephone Encounter (Signed)
  FYI Only or Action Required?: FYI only for provider  Patient was last seen in primary care on 07/04/2023 by Jose Ngo, MD. Called Nurse Triage reporting Exposure to STD. Symptoms began several days ago. Interventions attempted: Nothing. Symptoms are: Asymptomatic.  Triage Disposition: See PCP When Office is Open (Within 3 Days)  Patient/caregiver understands and will follow disposition?: Yes                     Reason for Disposition  Sex partner of someone who was diagnosed with an STI  (Exception: Male exposed to bacterial vaginosis or vaginal yeast infection.)  Answer Assessment - Initial Assessment Questions 1. MAIN CONCERN: What were you exposed to?  What sexually transmitted infection (STI) does your sex partner have? (e.g., gonorrhea, herpes, HIV, pubic lice)     Unknown of STD/STI partner was diagnosed with   2. ROUTE of EXPOSURE: How were you exposed to the STI? (e.g., oral, vaginal, or rectal intercourse)     Vaginal   3. DATE of EXPOSURE: When did the exposure occur? (e.g., days)     Last Sunday   4. SYMPTOMS: Do you have any symptoms? (e.g., pain with urination, rash, sores)    Asymptomatic   Pt. Called in wanting the same Rx. Written as his partner.His partner was prescribed Metronidazole 500mg . He is unaware of her diagnosis. Pt. Was offered an appointment however the soonest available is a few days away. Pt. Agrees to be seen in UC for immediate testing/treatment.  Protocols used: STI Exposure-A-AH

## 2024-01-08 NOTE — Telephone Encounter (Signed)
 I called and spoke to the patient he stated he went to the UC to get tested yesterday and his results were negative.

## 2024-01-11 ENCOUNTER — Other Ambulatory Visit: Payer: Self-pay

## 2024-01-11 ENCOUNTER — Emergency Department (HOSPITAL_COMMUNITY)

## 2024-01-11 ENCOUNTER — Encounter (HOSPITAL_COMMUNITY): Payer: Self-pay

## 2024-01-11 ENCOUNTER — Emergency Department (HOSPITAL_COMMUNITY)
Admission: EM | Admit: 2024-01-11 | Discharge: 2024-01-12 | Disposition: A | Discharge status: Home / Self Care | Attending: Emergency Medicine | Admitting: Emergency Medicine

## 2024-01-11 DIAGNOSIS — M79662 Pain in left lower leg: Secondary | ICD-10-CM | POA: Diagnosis not present

## 2024-01-11 DIAGNOSIS — R0789 Other chest pain: Secondary | ICD-10-CM | POA: Diagnosis not present

## 2024-01-11 DIAGNOSIS — N2889 Other specified disorders of kidney and ureter: Secondary | ICD-10-CM

## 2024-01-11 DIAGNOSIS — I6523 Occlusion and stenosis of bilateral carotid arteries: Secondary | ICD-10-CM | POA: Diagnosis not present

## 2024-01-11 DIAGNOSIS — S161XXA Strain of muscle, fascia and tendon at neck level, initial encounter: Secondary | ICD-10-CM

## 2024-01-11 DIAGNOSIS — S0001XA Abrasion of scalp, initial encounter: Secondary | ICD-10-CM | POA: Diagnosis not present

## 2024-01-11 DIAGNOSIS — R519 Headache, unspecified: Secondary | ICD-10-CM | POA: Insufficient documentation

## 2024-01-11 DIAGNOSIS — S301XXA Contusion of abdominal wall, initial encounter: Secondary | ICD-10-CM | POA: Diagnosis not present

## 2024-01-11 DIAGNOSIS — Y92410 Unspecified street and highway as the place of occurrence of the external cause: Secondary | ICD-10-CM | POA: Insufficient documentation

## 2024-01-11 DIAGNOSIS — I700 Atherosclerosis of aorta: Secondary | ICD-10-CM | POA: Diagnosis not present

## 2024-01-11 DIAGNOSIS — E279 Disorder of adrenal gland, unspecified: Secondary | ICD-10-CM | POA: Diagnosis not present

## 2024-01-11 DIAGNOSIS — R079 Chest pain, unspecified: Secondary | ICD-10-CM | POA: Diagnosis not present

## 2024-01-11 DIAGNOSIS — M542 Cervicalgia: Secondary | ICD-10-CM | POA: Diagnosis not present

## 2024-01-11 DIAGNOSIS — I499 Cardiac arrhythmia, unspecified: Secondary | ICD-10-CM | POA: Diagnosis not present

## 2024-01-11 DIAGNOSIS — F17200 Nicotine dependence, unspecified, uncomplicated: Secondary | ICD-10-CM | POA: Diagnosis not present

## 2024-01-11 DIAGNOSIS — N183 Chronic kidney disease, stage 3 unspecified: Secondary | ICD-10-CM | POA: Insufficient documentation

## 2024-01-11 DIAGNOSIS — M7918 Myalgia, other site: Secondary | ICD-10-CM

## 2024-01-11 DIAGNOSIS — Z041 Encounter for examination and observation following transport accident: Secondary | ICD-10-CM | POA: Diagnosis not present

## 2024-01-11 DIAGNOSIS — M791 Myalgia, unspecified site: Secondary | ICD-10-CM | POA: Diagnosis not present

## 2024-01-11 DIAGNOSIS — M79604 Pain in right leg: Secondary | ICD-10-CM | POA: Diagnosis not present

## 2024-01-11 DIAGNOSIS — R6889 Other general symptoms and signs: Secondary | ICD-10-CM | POA: Diagnosis not present

## 2024-01-11 DIAGNOSIS — S3991XA Unspecified injury of abdomen, initial encounter: Secondary | ICD-10-CM | POA: Diagnosis present

## 2024-01-11 DIAGNOSIS — S80811A Abrasion, right lower leg, initial encounter: Secondary | ICD-10-CM | POA: Insufficient documentation

## 2024-01-11 DIAGNOSIS — E278 Other specified disorders of adrenal gland: Secondary | ICD-10-CM

## 2024-01-11 LAB — I-STAT CHEM 8, ED
BUN: 30 mg/dL — ABNORMAL HIGH (ref 8–23)
Calcium, Ion: 1.21 mmol/L (ref 1.15–1.40)
Chloride: 111 mmol/L (ref 98–111)
Creatinine, Ser: 1.5 mg/dL — ABNORMAL HIGH (ref 0.61–1.24)
Glucose, Bld: 91 mg/dL (ref 70–99)
HCT: 36 % — ABNORMAL LOW (ref 39.0–52.0)
Hemoglobin: 12.2 g/dL — ABNORMAL LOW (ref 13.0–17.0)
Potassium: 3.2 mmol/L — ABNORMAL LOW (ref 3.5–5.1)
Sodium: 142 mmol/L (ref 135–145)
TCO2: 18 mmol/L — ABNORMAL LOW (ref 22–32)

## 2024-01-11 LAB — BASIC METABOLIC PANEL WITH GFR
Anion gap: 11 (ref 5–15)
BUN: 30 mg/dL — ABNORMAL HIGH (ref 8–23)
CO2: 20 mmol/L — ABNORMAL LOW (ref 22–32)
Calcium: 9.8 mg/dL (ref 8.9–10.3)
Chloride: 109 mmol/L (ref 98–111)
Creatinine, Ser: 1.74 mg/dL — ABNORMAL HIGH (ref 0.61–1.24)
GFR, Estimated: 43 mL/min — ABNORMAL LOW (ref >60)
Glucose, Bld: 104 mg/dL — ABNORMAL HIGH (ref 70–99)
Potassium: 3.3 mmol/L — ABNORMAL LOW (ref 3.5–5.1)
Sodium: 140 mmol/L (ref 135–145)

## 2024-01-11 LAB — CBC
HCT: 37.4 % — ABNORMAL LOW (ref 39.0–52.0)
Hemoglobin: 12.4 g/dL — ABNORMAL LOW (ref 13.0–17.0)
MCH: 30.2 pg (ref 26.0–34.0)
MCHC: 33.2 g/dL (ref 30.0–36.0)
MCV: 91.2 fL (ref 80.0–100.0)
Platelets: 261 10*3/uL (ref 150–400)
RBC: 4.1 MIL/uL — ABNORMAL LOW (ref 4.22–5.81)
RDW: 14 % (ref 11.5–15.5)
WBC: 7.7 10*3/uL (ref 4.0–10.5)
nRBC: 0 % (ref 0.0–0.2)

## 2024-01-11 LAB — TROPONIN I (HIGH SENSITIVITY): Troponin I (High Sensitivity): 4 ng/L (ref <18)

## 2024-01-11 MED ORDER — SODIUM CHLORIDE 0.9 % IV BOLUS
1000.0000 mL | Freq: Once | INTRAVENOUS | Status: AC
  Administered 2024-01-11: 1000 mL via INTRAVENOUS
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
## 2024-01-11 NOTE — ED Notes (Signed)
 C-collar applied to pt.
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
## 2024-01-11 NOTE — ED Notes (Signed)
 Patient transported to X-ray
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
## 2024-01-12 ENCOUNTER — Emergency Department (HOSPITAL_COMMUNITY)

## 2024-01-12 ENCOUNTER — Other Ambulatory Visit (HOSPITAL_COMMUNITY): Payer: Self-pay

## 2024-01-12 ENCOUNTER — Other Ambulatory Visit (HOSPITAL_COMMUNITY)

## 2024-01-12 DIAGNOSIS — I6523 Occlusion and stenosis of bilateral carotid arteries: Secondary | ICD-10-CM | POA: Diagnosis not present

## 2024-01-12 DIAGNOSIS — N2889 Other specified disorders of kidney and ureter: Secondary | ICD-10-CM | POA: Diagnosis not present

## 2024-01-12 DIAGNOSIS — Z041 Encounter for examination and observation following transport accident: Secondary | ICD-10-CM | POA: Diagnosis not present

## 2024-01-12 DIAGNOSIS — I700 Atherosclerosis of aorta: Secondary | ICD-10-CM | POA: Diagnosis not present

## 2024-01-12 LAB — LIPASE, BLOOD: Lipase: 54 U/L — ABNORMAL HIGH (ref 11–51)

## 2024-01-12 LAB — HEPATIC FUNCTION PANEL
ALT: 28 U/L (ref 0–44)
AST: 37 U/L (ref 15–41)
Albumin: 4.1 g/dL (ref 3.5–5.0)
Alkaline Phosphatase: 53 U/L (ref 38–126)
Bilirubin, Direct: 0.2 mg/dL (ref 0.0–0.2)
Indirect Bilirubin: 0.8 mg/dL (ref 0.3–0.9)
Total Bilirubin: 1 mg/dL (ref 0.0–1.2)
Total Protein: 7.2 g/dL (ref 6.5–8.1)

## 2024-01-12 MED ORDER — OXYCODONE-ACETAMINOPHEN 5-325 MG PO TABS
1.0000 | ORAL_TABLET | Freq: Once | ORAL | Status: AC
  Administered 2024-01-12: 1 via ORAL
  Filled 2024-01-12: qty 1

## 2024-01-12 MED ORDER — IOHEXOL 300 MG/ML  SOLN
75.0000 mL | Freq: Once | INTRAMUSCULAR | Status: AC | PRN
  Administered 2024-01-12: 75 mL via INTRAVENOUS

## 2024-01-12 MED ORDER — HYDROCODONE-ACETAMINOPHEN 5-325 MG PO TABS: 1.0000 | ORAL_TABLET | ORAL | 0 refills | Status: DC | PRN

## 2024-01-12 NOTE — Discharge Instructions (Signed)
 Norco as needed as prescribed for pain not controlled with Motrin  and Tylenol . Return to the ER for worsening or concerning symptoms. Warm compresses to sore muscles for 20 minutes at a time, follow with gentle range of motion exercises.   Follow up with your primary care as scheduled on Tuesday to review today's results and discuss referral to physical therapy.

## 2024-01-12 NOTE — ED Notes (Signed)
 Patient transported to CT

## 2024-01-14 ENCOUNTER — Other Ambulatory Visit (HOSPITAL_COMMUNITY)
Admission: RE | Admit: 2024-01-14 | Discharge: 2024-01-14 | Disposition: A | Discharge status: Home / Self Care | Source: Ambulatory Visit

## 2024-01-14 ENCOUNTER — Ambulatory Visit: Admitting: Internal Medicine

## 2024-01-14 ENCOUNTER — Encounter: Payer: Self-pay | Admitting: Internal Medicine

## 2024-01-14 ENCOUNTER — Ambulatory Visit (HOSPITAL_COMMUNITY)
Admission: RE | Admit: 2024-01-14 | Discharge: 2024-01-14 | Disposition: A | Discharge status: Home / Self Care | Source: Ambulatory Visit | Attending: Internal Medicine | Admitting: Internal Medicine

## 2024-01-14 ENCOUNTER — Telehealth: Payer: Self-pay | Admitting: *Deleted

## 2024-01-14 VITALS — BP 107/77 | HR 89 | Temp 98.2°F | Ht 73.0 in | Wt 171.6 lb

## 2024-01-14 DIAGNOSIS — D369 Benign neoplasm, unspecified site: Secondary | ICD-10-CM

## 2024-01-14 DIAGNOSIS — R1900 Intra-abdominal and pelvic swelling, mass and lump, unspecified site: Secondary | ICD-10-CM

## 2024-01-14 DIAGNOSIS — I129 Hypertensive chronic kidney disease with stage 1 through stage 4 chronic kidney disease, or unspecified chronic kidney disease: Secondary | ICD-10-CM

## 2024-01-14 DIAGNOSIS — Z202 Contact with and (suspected) exposure to infections with a predominantly sexual mode of transmission: Secondary | ICD-10-CM | POA: Insufficient documentation

## 2024-01-14 DIAGNOSIS — I700 Atherosclerosis of aorta: Secondary | ICD-10-CM

## 2024-01-14 DIAGNOSIS — R0781 Pleurodynia: Secondary | ICD-10-CM | POA: Diagnosis not present

## 2024-01-14 DIAGNOSIS — E278 Other specified disorders of adrenal gland: Secondary | ICD-10-CM | POA: Diagnosis not present

## 2024-01-14 DIAGNOSIS — N2889 Other specified disorders of kidney and ureter: Secondary | ICD-10-CM | POA: Insufficient documentation

## 2024-01-14 DIAGNOSIS — N1831 Chronic kidney disease, stage 3a: Secondary | ICD-10-CM

## 2024-01-14 DIAGNOSIS — I10 Essential (primary) hypertension: Secondary | ICD-10-CM

## 2024-01-14 MED ORDER — LIDOCAINE 5 % EX PTCH
1.0000 | MEDICATED_PATCH | CUTANEOUS | 0 refills | Status: AC
Start: 2024-01-14 — End: 2024-01-24

## 2024-01-14 MED ORDER — DICLOFENAC SODIUM 1 % EX GEL: 4.0000 g | Freq: Four times a day (QID) | CUTANEOUS | 1 refills | Status: DC
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
## 2024-01-14 NOTE — Assessment & Plan Note (Signed)
 Assessment: Patient's long term partner tested positive for trichomonas and received treatment. He went to an UC and tested negative for trich, gonorrhea, and chlamydia. He would be interested in repeating this testing and doing broader STI testing.   Plan: Urine sample for trich/g/c and serum testing for HIV and RPR

## 2024-01-14 NOTE — Assessment & Plan Note (Signed)
 Seen on CT chest 03/2023. Remains on statin therapy.
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
## 2024-01-14 NOTE — Assessment & Plan Note (Addendum)
 Most recent Cr level  was 1.74 01/11/24 during ED encounter, slightly above recent baseline. He is taking his BP medication regularly.   Will repeat BMP with GFR today.

## 2024-01-14 NOTE — Progress Notes (Signed)
 Complex Care Management Note  Care Guide Note 01/14/2024 Name: KODEN HUNZEKER Sr. MRN: [id number] DOB: May 22, 1956  [name] is a 68 y.o. year old male who sees [name], MD for primary care. I reached out to [employer] Sr. by phone today to offer complex care management services.  Mr. Weedon was given information about Complex Care Management services today including:   The Complex Care Management services include support from the care team which includes your Nurse Care Manager, Clinical Social Worker, or Pharmacist.  The Complex Care Management team is here to help remove barriers to the health concerns and goals most important to you. Complex Care Management services are voluntary, and the patient may decline or stop services at any time by request to their care team member.   Complex Care Management Consent Status: Patient agreed to services and verbal consent obtained.   Follow up plan:  Telephone appointment with complex care management team member scheduled for:  01/15/24  Encounter Outcome:  Patient Scheduled  Harlene Satterfield  [facility]  [facility], [facility] Guide  Direct Dial: [phone]  Fax [phone]

## 2024-01-14 NOTE — Assessment & Plan Note (Signed)
Chronic and stable. Well managed with amlodipine-hctz-olmesartan 05-17-39 mg daily. Continue current regimen.  Plan -BMP today -Continue combination pill

## 2024-01-14 NOTE — Progress Notes (Signed)
 Complex Care Management Note Care Guide Note  01/14/2024 Name: Jonathan SMALLS Sr. MRN: [id number] DOB: [birth year]   Complex Care Management Outreach Attempts: An unsuccessful telephone outreach was attempted today to offer the patient information about available complex care management services.  Follow Up Plan:  Additional outreach attempts will be made to offer the patient complex care management information and services.   Encounter Outcome:  No Answer  Harlene Satterfield  [facility]  [facility], [facility] Guide  Direct Dial: [phone]  Fax [phone]

## 2024-01-14 NOTE — Patient Instructions (Addendum)
 Hello Jonathan [name],   Thank you for letting us  provide your care today. We will be calling you with your results for your labwork and xray results. Additionally, someone should be calling you regarding both physical therapy and counseling. Additionally, you will be receiving a call for scheduling your MRI.   In regard to medications for pain, you may take 1000 mg of tylenol  every 8 hours and you may also use the lidocaine  patches and Voltaren  gel that we have sent in for you.   We would like to see you back in 1 month.   Thank you!

## 2024-01-14 NOTE — Assessment & Plan Note (Deleted)
 Assessment: Patient's long term partner tested positive for trichomonas and received treatment. He went to an UC and tested negative for trich, gonorrhea, and chlamydia. He would be interested in repeating this testing and doing broader STI testing.   Plan: Urine sample for trich/g/c and serum testing for HIV and RPR
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
## 2024-01-14 NOTE — Assessment & Plan Note (Signed)
 Completed CSY 09/2023, combination of polyps seen including tubular adenomas. Recommend repeat in 1 year.
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
## 2024-01-15 ENCOUNTER — Ambulatory Visit: Payer: Self-pay | Admitting: Licensed Clinical Social Worker

## 2024-01-15 ENCOUNTER — Telehealth: Admitting: Licensed Clinical Social Worker

## 2024-01-15 DIAGNOSIS — F430 Acute stress reaction: Secondary | ICD-10-CM

## 2024-01-15 LAB — BASIC METABOLIC PANEL WITH GFR
BUN/Creatinine Ratio: 18 (ref 10–24)
BUN: 28 mg/dL — ABNORMAL HIGH (ref 8–27)
CO2: 21 mmol/L (ref 20–29)
Calcium: 10.3 mg/dL — ABNORMAL HIGH (ref 8.6–10.2)
Chloride: 106 mmol/L (ref 96–106)
Creatinine, Ser: 1.52 mg/dL — ABNORMAL HIGH (ref 0.76–1.27)
Glucose: 84 mg/dL (ref 70–99)
Potassium: 4.1 mmol/L (ref 3.5–5.2)
Sodium: 141 mmol/L (ref 134–144)
eGFR: 50 mL/min/{1.73_m2} — ABNORMAL LOW (ref >59)

## 2024-01-15 LAB — URINE CYTOLOGY ANCILLARY ONLY
Chlamydia: NEGATIVE
Comment: NEGATIVE
Comment: NEGATIVE
Comment: NORMAL
Neisseria Gonorrhea: NEGATIVE
Trichomonas: NEGATIVE

## 2024-01-15 LAB — HIV ANTIBODY (ROUTINE TESTING W REFLEX): HIV Screen 4th Generation wRfx: NONREACTIVE

## 2024-01-15 LAB — RPR: RPR Ser Ql: NONREACTIVE
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
## 2024-01-16 ENCOUNTER — Ambulatory Visit: Payer: Self-pay | Admitting: Internal Medicine

## 2024-01-16 DIAGNOSIS — C641 Malignant neoplasm of right kidney, except renal pelvis: Secondary | ICD-10-CM

## 2024-01-16 DIAGNOSIS — C642 Malignant neoplasm of left kidney, except renal pelvis: Secondary | ICD-10-CM

## 2024-01-16 NOTE — Progress Notes (Signed)
 Labs reviewed with Jonathan [name] 6/26. Kidney function stable. Mild increase in calcium . Monitor at f/u in one month.

## 2024-01-16 NOTE — Progress Notes (Signed)
 Reviewed with Mr. Fermin 6/26.

## 2024-01-16 NOTE — Progress Notes (Signed)
 Reviewed with Jonathan [name] 6/26. RPR, HIV, GC/chlamydia, trichomonas negative.

## 2024-01-30 ENCOUNTER — Ambulatory Visit: Payer: Self-pay | Admitting: Licensed Clinical Social Worker

## 2024-01-30 DIAGNOSIS — G4701 Insomnia due to medical condition: Secondary | ICD-10-CM

## 2024-01-30 DIAGNOSIS — F4310 Post-traumatic stress disorder, unspecified: Secondary | ICD-10-CM

## 2024-01-30 DIAGNOSIS — F5102 Adjustment insomnia: Secondary | ICD-10-CM | POA: Insufficient documentation
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
## 2024-01-31 ENCOUNTER — Ambulatory Visit

## 2024-02-04 ENCOUNTER — Ambulatory Visit: Attending: Internal Medicine | Admitting: Physical Therapy

## 2024-02-04 ENCOUNTER — Other Ambulatory Visit: Payer: Self-pay

## 2024-02-04 ENCOUNTER — Encounter: Payer: Self-pay | Admitting: Physical Therapy

## 2024-02-04 DIAGNOSIS — M79605 Pain in left leg: Secondary | ICD-10-CM | POA: Diagnosis not present

## 2024-02-04 DIAGNOSIS — M542 Cervicalgia: Secondary | ICD-10-CM | POA: Insufficient documentation

## 2024-02-04 DIAGNOSIS — M6281 Muscle weakness (generalized): Secondary | ICD-10-CM | POA: Insufficient documentation

## 2024-02-04 DIAGNOSIS — R269 Unspecified abnormalities of gait and mobility: Secondary | ICD-10-CM | POA: Diagnosis not present

## 2024-02-04 DIAGNOSIS — R2689 Other abnormalities of gait and mobility: Secondary | ICD-10-CM | POA: Insufficient documentation
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
## 2024-02-05 ENCOUNTER — Ambulatory Visit: Admitting: Student

## 2024-02-05 ENCOUNTER — Other Ambulatory Visit: Payer: Self-pay

## 2024-02-05 VITALS — BP 115/73 | HR 80 | Temp 98.0°F | Ht 73.0 in | Wt 172.7 lb

## 2024-02-05 DIAGNOSIS — R251 Tremor, unspecified: Secondary | ICD-10-CM

## 2024-02-05 DIAGNOSIS — G479 Sleep disorder, unspecified: Secondary | ICD-10-CM | POA: Diagnosis not present

## 2024-02-05 MED ORDER — GABAPENTIN 600 MG PO TABS: 600.0000 mg | ORAL_TABLET | Freq: Every day | ORAL | 3 refills | Status: AC
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
## 2024-02-05 NOTE — Patient Instructions (Signed)
 Thank you so much for coming to the clinic today!   I am increasing your gabapentin  to 600mg , please take at night I have sent in a referral to neurology  If you have any questions please feel free to the call the clinic at anytime at [phone]. It was a pleasure seeing you!  Best, Dr. Muaz Shorey
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
## 2024-02-10 ENCOUNTER — Ambulatory Visit (INDEPENDENT_AMBULATORY_CARE_PROVIDER_SITE_OTHER): Payer: Self-pay | Admitting: Licensed Clinical Social Worker

## 2024-02-10 DIAGNOSIS — F430 Acute stress reaction: Secondary | ICD-10-CM
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
## 2024-02-12 NOTE — Addendum Note (Signed)
 Addended by: [name] on: 02/12/2024 09:09 AM   Modules accepted: Level of Service
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
## 2024-02-13 ENCOUNTER — Encounter: Payer: Self-pay | Admitting: Physical Therapy

## 2024-02-13 ENCOUNTER — Ambulatory Visit: Admitting: Physical Therapy

## 2024-02-13 ENCOUNTER — Other Ambulatory Visit: Payer: Self-pay | Admitting: Internal Medicine

## 2024-02-13 DIAGNOSIS — R2689 Other abnormalities of gait and mobility: Secondary | ICD-10-CM

## 2024-02-13 DIAGNOSIS — M79605 Pain in left leg: Secondary | ICD-10-CM | POA: Diagnosis not present

## 2024-02-13 DIAGNOSIS — M542 Cervicalgia: Secondary | ICD-10-CM

## 2024-02-13 DIAGNOSIS — M6281 Muscle weakness (generalized): Secondary | ICD-10-CM

## 2024-02-13 DIAGNOSIS — R269 Unspecified abnormalities of gait and mobility: Secondary | ICD-10-CM | POA: Diagnosis not present

## 2024-02-13 NOTE — Telephone Encounter (Signed)
 Medication sent to pharmacy
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
## 2024-02-14 ENCOUNTER — Ambulatory Visit
Admission: RE | Admit: 2024-02-14 | Discharge: 2024-02-14 | Disposition: A | Discharge status: Home / Self Care | Source: Ambulatory Visit | Attending: Internal Medicine | Admitting: Internal Medicine

## 2024-02-14 DIAGNOSIS — N2889 Other specified disorders of kidney and ureter: Secondary | ICD-10-CM | POA: Diagnosis not present

## 2024-02-14 DIAGNOSIS — R1900 Intra-abdominal and pelvic swelling, mass and lump, unspecified site: Secondary | ICD-10-CM

## 2024-02-14 DIAGNOSIS — K5730 Diverticulosis of large intestine without perforation or abscess without bleeding: Secondary | ICD-10-CM | POA: Diagnosis not present

## 2024-02-14 DIAGNOSIS — E278 Other specified disorders of adrenal gland: Secondary | ICD-10-CM | POA: Diagnosis not present

## 2024-02-14 MED ORDER — GADOPICLENOL 0.5 MMOL/ML IV SOLN
7.0000 mL | Freq: Once | INTRAVENOUS | Status: AC | PRN
  Administered 2024-02-14: 7 mL via INTRAVENOUS

## 2024-02-17 ENCOUNTER — Encounter: Payer: Self-pay | Admitting: Physical Therapy

## 2024-02-17 ENCOUNTER — Ambulatory Visit: Admitting: Physical Therapy

## 2024-02-17 ENCOUNTER — Telehealth: Payer: Self-pay | Admitting: *Deleted

## 2024-02-17 DIAGNOSIS — R269 Unspecified abnormalities of gait and mobility: Secondary | ICD-10-CM | POA: Diagnosis not present

## 2024-02-17 DIAGNOSIS — M79605 Pain in left leg: Secondary | ICD-10-CM | POA: Diagnosis not present

## 2024-02-17 DIAGNOSIS — M6281 Muscle weakness (generalized): Secondary | ICD-10-CM | POA: Diagnosis not present

## 2024-02-17 DIAGNOSIS — M542 Cervicalgia: Secondary | ICD-10-CM | POA: Diagnosis not present

## 2024-02-17 DIAGNOSIS — R2689 Other abnormalities of gait and mobility: Secondary | ICD-10-CM | POA: Diagnosis not present

## 2024-02-17 NOTE — Progress Notes (Signed)
 Reviewed MRI findings with [name] 7/28.   Adrenal nodularity/thickening appears benign and requires no further f/u.  Right kidney mass, 3.7 x 3.0 cm, consistent with renal cell carcinoma and left kidney mass, 1.1 x 0.8 cm, also consistent with RCC. No evidence of LAD or metastatic disease.   Discussed referral to urology for further evaluation and management. [name] understands the plan and had no further questions at this time.

## 2024-02-17 NOTE — Telephone Encounter (Signed)
 Will forward to Dr. Karna. Copied from CRM [id number]. Topic: Clinical - Lab/Test Results >> Feb 17, 2024  2:47 PM Adrianna P wrote: Reason for CRM: Patient like Dr. Karna to call him regarding lab results. Please call [phone]
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
## 2024-02-17 NOTE — Telephone Encounter (Signed)
 Returned patient's call. We had spoken about MRI findings of b/l renal cell carcinoma this morning. He just needed a reminder of what cancer we had discussed.

## 2024-02-18 ENCOUNTER — Encounter: Payer: Self-pay | Admitting: *Deleted

## 2024-02-20 ENCOUNTER — Telehealth: Payer: Self-pay | Admitting: *Deleted

## 2024-02-20 NOTE — Telephone Encounter (Signed)
 Copied from CRM [id number]. Topic: Clinical - Lab/Test Results >> Feb 20, 2024 12:36 PM Cherylann RAMAN wrote: Reason for CRM: Patient returning Glady's call regarding lab results. Please contact patient at  [phone].  Return call to patient .  Message left that the Clinics had returned his call.  RTC from patient stated he had talked with Doctor  Karna and a referral to Urology had been made.  Patient was given the name of the practice [facility] -address and phone number. Plans to call to follow up on appointment

## 2024-02-21 ENCOUNTER — Ambulatory Visit

## 2024-02-24 ENCOUNTER — Encounter: Payer: Self-pay | Admitting: Physical Therapy

## 2024-02-24 ENCOUNTER — Ambulatory Visit: Attending: Internal Medicine | Admitting: Physical Therapy

## 2024-02-24 DIAGNOSIS — M25571 Pain in right ankle and joints of right foot: Secondary | ICD-10-CM | POA: Insufficient documentation

## 2024-02-24 DIAGNOSIS — M6281 Muscle weakness (generalized): Secondary | ICD-10-CM | POA: Insufficient documentation

## 2024-02-24 DIAGNOSIS — M25671 Stiffness of right ankle, not elsewhere classified: Secondary | ICD-10-CM | POA: Diagnosis not present

## 2024-02-24 DIAGNOSIS — M79605 Pain in left leg: Secondary | ICD-10-CM | POA: Diagnosis not present

## 2024-02-24 DIAGNOSIS — R2689 Other abnormalities of gait and mobility: Secondary | ICD-10-CM | POA: Insufficient documentation

## 2024-02-24 DIAGNOSIS — M542 Cervicalgia: Secondary | ICD-10-CM | POA: Diagnosis not present
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
## 2024-02-26 ENCOUNTER — Ambulatory Visit: Admitting: Physical Therapy

## 2024-02-26 ENCOUNTER — Encounter: Payer: Self-pay | Admitting: Physical Therapy

## 2024-02-26 DIAGNOSIS — D49511 Neoplasm of unspecified behavior of right kidney: Secondary | ICD-10-CM | POA: Diagnosis not present

## 2024-02-26 DIAGNOSIS — M542 Cervicalgia: Secondary | ICD-10-CM

## 2024-02-26 DIAGNOSIS — M6281 Muscle weakness (generalized): Secondary | ICD-10-CM

## 2024-02-26 DIAGNOSIS — R2689 Other abnormalities of gait and mobility: Secondary | ICD-10-CM

## 2024-02-26 DIAGNOSIS — M79605 Pain in left leg: Secondary | ICD-10-CM

## 2024-02-26 DIAGNOSIS — D49512 Neoplasm of unspecified behavior of left kidney: Secondary | ICD-10-CM | POA: Diagnosis not present

## 2024-02-26 DIAGNOSIS — M25671 Stiffness of right ankle, not elsewhere classified: Secondary | ICD-10-CM | POA: Diagnosis not present

## 2024-02-26 DIAGNOSIS — M25571 Pain in right ankle and joints of right foot: Secondary | ICD-10-CM | POA: Diagnosis not present
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
## 2024-02-27 ENCOUNTER — Ambulatory Visit: Admitting: Licensed Clinical Social Worker

## 2024-02-27 ENCOUNTER — Other Ambulatory Visit: Payer: Self-pay | Admitting: Internal Medicine

## 2024-02-27 DIAGNOSIS — E785 Hyperlipidemia, unspecified: Secondary | ICD-10-CM

## 2024-02-27 DIAGNOSIS — Z7189 Other specified counseling: Secondary | ICD-10-CM

## 2024-02-27 NOTE — Telephone Encounter (Signed)
 Medication sent to pharmacy
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
## 2024-03-02 ENCOUNTER — Telehealth: Payer: Self-pay | Admitting: Physical Therapy

## 2024-03-02 ENCOUNTER — Ambulatory Visit: Admitting: Physical Therapy

## 2024-03-02 NOTE — Telephone Encounter (Signed)
Called and informed patient of missed visit and provided reminder of next appt and attendance policy.  

## 2024-03-03 ENCOUNTER — Ambulatory Visit: Admitting: Student

## 2024-03-03 VITALS — BP 132/88 | HR 69 | Ht 73.0 in | Wt 168.8 lb

## 2024-03-03 DIAGNOSIS — D4102 Neoplasm of uncertain behavior of left kidney: Secondary | ICD-10-CM | POA: Diagnosis not present

## 2024-03-03 DIAGNOSIS — D4101 Neoplasm of uncertain behavior of right kidney: Secondary | ICD-10-CM

## 2024-03-03 DIAGNOSIS — F17210 Nicotine dependence, cigarettes, uncomplicated: Secondary | ICD-10-CM

## 2024-03-03 DIAGNOSIS — F4310 Post-traumatic stress disorder, unspecified: Secondary | ICD-10-CM | POA: Diagnosis not present

## 2024-03-03 DIAGNOSIS — I10 Essential (primary) hypertension: Secondary | ICD-10-CM

## 2024-03-03 DIAGNOSIS — F17200 Nicotine dependence, unspecified, uncomplicated: Secondary | ICD-10-CM

## 2024-03-03 NOTE — Assessment & Plan Note (Addendum)
 Prior quit attempts: yes, very brief periods, ready to quit now Triggers and coping strategies: stress, boredom, habit, doesn't even really enjoy smoking Planned quit date: April 22 2024, before planned kidney surgery Pharmacotherapy: declined today, but talked briefly about Chantix  and Wellbutrin 3-10 minutes spent on counseling. Referral to [facility] pharmacy resource for tobacco cessation placed.
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
## 2024-03-03 NOTE — Patient Instructions (Addendum)
 Return in about 3 months (around 06/03/2024) for preventive care visit.  Remember to bring all of the medications that you take (including over the counter medications and supplements) with you to every clinic visit.  This after visit summary is an important review of tests, referrals, and medication changes that were discussed during your visit. If you have questions or concerns, call [phone]. Outside of clinic business hours, call the main hospital at [phone] and ask the operator for the on-call internal medicine resident.   [name] MD 03/03/2024, 10:29 AM
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
## 2024-03-03 NOTE — Assessment & Plan Note (Signed)
 Seeing urology, tentative plan is for surgery, probably partial nephrectomy in October.  Will see if we can get those records from [facility] urology.  Today does not have any systemic signs or symptoms.  Alkaline phosphatase in June was normal.  Continue following up with urology for this.

## 2024-03-03 NOTE — Assessment & Plan Note (Signed)
   Plan per above

## 2024-03-03 NOTE — Assessment & Plan Note (Signed)
 BP Readings from Last 3 Encounters:  03/03/24 132/88  02/05/24 115/73  01/14/24 107/77   Appropriate to continue olmesartan -amlodipine -hydrochlorothiazide  40-10-25 mg

## 2024-03-04 ENCOUNTER — Ambulatory Visit: Admitting: Physical Therapy

## 2024-03-04 ENCOUNTER — Encounter: Payer: Self-pay | Admitting: Physical Therapy

## 2024-03-04 DIAGNOSIS — M542 Cervicalgia: Secondary | ICD-10-CM | POA: Diagnosis not present

## 2024-03-04 DIAGNOSIS — M6281 Muscle weakness (generalized): Secondary | ICD-10-CM

## 2024-03-04 DIAGNOSIS — M25671 Stiffness of right ankle, not elsewhere classified: Secondary | ICD-10-CM | POA: Diagnosis not present

## 2024-03-04 DIAGNOSIS — R2689 Other abnormalities of gait and mobility: Secondary | ICD-10-CM | POA: Diagnosis not present

## 2024-03-04 DIAGNOSIS — M79605 Pain in left leg: Secondary | ICD-10-CM | POA: Diagnosis not present

## 2024-03-04 DIAGNOSIS — M25571 Pain in right ankle and joints of right foot: Secondary | ICD-10-CM | POA: Diagnosis not present

## 2024-03-04 NOTE — Progress Notes (Signed)
 Internal Medicine Clinic Attending  Case discussed with the resident at the time of the visit.  We reviewed the resident's history and exam and pertinent patient test results.  I agree with the assessment, diagnosis, and plan of care documented in the resident's note.
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
## 2024-03-05 ENCOUNTER — Other Ambulatory Visit: Payer: Self-pay | Admitting: Nurse Practitioner

## 2024-03-10 ENCOUNTER — Ambulatory Visit: Payer: Self-pay | Admitting: Physical Therapy

## 2024-03-10 ENCOUNTER — Encounter: Payer: Self-pay | Admitting: Physical Therapy

## 2024-03-10 DIAGNOSIS — M25671 Stiffness of right ankle, not elsewhere classified: Secondary | ICD-10-CM | POA: Diagnosis not present

## 2024-03-10 DIAGNOSIS — R2689 Other abnormalities of gait and mobility: Secondary | ICD-10-CM | POA: Diagnosis not present

## 2024-03-10 DIAGNOSIS — M6281 Muscle weakness (generalized): Secondary | ICD-10-CM

## 2024-03-10 DIAGNOSIS — M542 Cervicalgia: Secondary | ICD-10-CM

## 2024-03-10 DIAGNOSIS — M79605 Pain in left leg: Secondary | ICD-10-CM

## 2024-03-10 DIAGNOSIS — M25571 Pain in right ankle and joints of right foot: Secondary | ICD-10-CM | POA: Diagnosis not present
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
## 2024-03-11 ENCOUNTER — Ambulatory Visit: Admitting: Physical Therapy

## 2024-03-11 DIAGNOSIS — M542 Cervicalgia: Secondary | ICD-10-CM | POA: Diagnosis not present

## 2024-03-11 DIAGNOSIS — M6281 Muscle weakness (generalized): Secondary | ICD-10-CM | POA: Diagnosis not present

## 2024-03-11 DIAGNOSIS — M25671 Stiffness of right ankle, not elsewhere classified: Secondary | ICD-10-CM | POA: Diagnosis not present

## 2024-03-11 DIAGNOSIS — M25571 Pain in right ankle and joints of right foot: Secondary | ICD-10-CM | POA: Diagnosis not present

## 2024-03-11 DIAGNOSIS — M79605 Pain in left leg: Secondary | ICD-10-CM

## 2024-03-11 DIAGNOSIS — R2689 Other abnormalities of gait and mobility: Secondary | ICD-10-CM | POA: Diagnosis not present
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
## 2024-03-16 ENCOUNTER — Ambulatory Visit: Payer: Self-pay | Admitting: Physical Therapy

## 2024-03-16 ENCOUNTER — Encounter: Payer: Self-pay | Admitting: Physical Therapy

## 2024-03-16 DIAGNOSIS — M25671 Stiffness of right ankle, not elsewhere classified: Secondary | ICD-10-CM | POA: Diagnosis not present

## 2024-03-16 DIAGNOSIS — M6281 Muscle weakness (generalized): Secondary | ICD-10-CM | POA: Diagnosis not present

## 2024-03-16 DIAGNOSIS — M79605 Pain in left leg: Secondary | ICD-10-CM

## 2024-03-16 DIAGNOSIS — R2689 Other abnormalities of gait and mobility: Secondary | ICD-10-CM

## 2024-03-16 DIAGNOSIS — M25571 Pain in right ankle and joints of right foot: Secondary | ICD-10-CM | POA: Diagnosis not present

## 2024-03-16 DIAGNOSIS — M542 Cervicalgia: Secondary | ICD-10-CM | POA: Diagnosis not present
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
## 2024-03-18 ENCOUNTER — Encounter: Payer: Self-pay | Admitting: Physical Therapy

## 2024-03-18 ENCOUNTER — Ambulatory Visit: Payer: Self-pay | Admitting: Physical Therapy

## 2024-03-18 DIAGNOSIS — M542 Cervicalgia: Secondary | ICD-10-CM

## 2024-03-18 DIAGNOSIS — M6281 Muscle weakness (generalized): Secondary | ICD-10-CM

## 2024-03-18 DIAGNOSIS — M25671 Stiffness of right ankle, not elsewhere classified: Secondary | ICD-10-CM

## 2024-03-18 DIAGNOSIS — M25571 Pain in right ankle and joints of right foot: Secondary | ICD-10-CM | POA: Diagnosis not present

## 2024-03-18 DIAGNOSIS — R2689 Other abnormalities of gait and mobility: Secondary | ICD-10-CM

## 2024-03-18 DIAGNOSIS — M79605 Pain in left leg: Secondary | ICD-10-CM | POA: Diagnosis not present
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
## 2024-03-19 ENCOUNTER — Encounter: Payer: Self-pay | Admitting: Physical Therapy

## 2024-03-25 ENCOUNTER — Ambulatory Visit: Payer: Self-pay | Attending: Internal Medicine | Admitting: Physical Therapy

## 2024-03-25 ENCOUNTER — Encounter: Payer: Self-pay | Admitting: Physical Therapy

## 2024-03-25 DIAGNOSIS — M79605 Pain in left leg: Secondary | ICD-10-CM | POA: Diagnosis not present

## 2024-03-25 DIAGNOSIS — M6281 Muscle weakness (generalized): Secondary | ICD-10-CM | POA: Diagnosis not present

## 2024-03-25 DIAGNOSIS — M542 Cervicalgia: Secondary | ICD-10-CM | POA: Insufficient documentation
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
## 2024-03-26 ENCOUNTER — Ambulatory Visit: Admitting: Licensed Clinical Social Worker

## 2024-03-27 ENCOUNTER — Ambulatory Visit: Payer: Self-pay | Admitting: Physical Therapy

## 2024-03-27 ENCOUNTER — Telehealth: Payer: Self-pay | Admitting: Physical Therapy

## 2024-03-27 NOTE — Telephone Encounter (Signed)
Called and informed patient of missed visit and provided reminder of next appt and attendance policy.  

## 2024-03-30 ENCOUNTER — Telehealth: Payer: Self-pay | Admitting: *Deleted

## 2024-03-30 NOTE — Progress Notes (Signed)
 Care Guide Pharmacy Note  03/30/2024 Name: [name]. MRN: [id number] DOB: Aug 19, 1955  Referred By: [name], MD Reason for referral: Complex Care Management (Initial outreach to schedule PharmD referral )   [name] is a 68 y.o. year old male who is a primary care patient of [name], MD.  [name] was referred to the pharmacist for assistance related to: tobacco cessation   An unsuccessful telephone outreach was attempted today to contact the patient who was referred to the pharmacy team for assistance with Smoking Cessation Consultation. Additional attempts will be made to contact the patient.  Harlene Satterfield  [facility]  [facility], [facility] Guide  Direct Dial: [phone]  Fax [phone]

## 2024-03-30 NOTE — Telephone Encounter (Signed)
 Rtn pt's call to resch his missed appt from 03/26/2024. PT VM not Ava. Unable to leave a message.  Copied from CRM [phone]. Topic: Appointments - Scheduling Inquiry for Clinic >> Mar 30, 2024 10:24 AM Graeme ORN wrote: Reason for CRM: Patient called. Missed appt with Renda. Would like to reschedule. Thank You

## 2024-03-31 ENCOUNTER — Telehealth: Payer: Self-pay | Admitting: *Deleted

## 2024-03-31 NOTE — Progress Notes (Signed)
 Care Guide Pharmacy Note  03/31/2024 Name: Jonathan PESTA Sr. MRN: [id number] DOB: 02-09-1956  Referred By: [name], MD Reason for referral: Complex Care Management (Initial outreach to schedule referral with PharmS) and Call Attempt #2   Jonathan LITTIE Specking Sr. is a 68 y.o. year old male who is a primary care patient of [name], MD.  Jonathan LITTIE Specking Sr. was referred to the pharmacist for assistance related to: Tobacco Cessation   Successful contact was made with the patient to discuss pharmacy services including being ready for the pharmacist to call at least 5 minutes before the scheduled appointment time and to have medication bottles and any blood pressure readings ready for review. The patient agreed to meet with the pharmacist via in office  on (date/time).04/02/24 at 10:00 AM  Harlene [facility]  [facility], [facility] Guide  Direct Dial: [phone]  Fax [phone]

## 2024-04-02 ENCOUNTER — Other Ambulatory Visit (HOSPITAL_COMMUNITY): Payer: Self-pay

## 2024-04-02 ENCOUNTER — Ambulatory Visit

## 2024-04-02 DIAGNOSIS — F17200 Nicotine dependence, unspecified, uncomplicated: Secondary | ICD-10-CM

## 2024-04-02 DIAGNOSIS — F17210 Nicotine dependence, cigarettes, uncomplicated: Secondary | ICD-10-CM | POA: Diagnosis not present

## 2024-04-02 MED ORDER — VARENICLINE TARTRATE 1 MG PO TABS: 1.0000 mg | ORAL_TABLET | Freq: Two times a day (BID) | ORAL | 1 refills | Status: DC

## 2024-04-02 MED ORDER — VARENICLINE TARTRATE (STARTER) 0.5 MG X 11 & 1 MG X 42 PO TBPK: ORAL_TABLET | ORAL | 0 refills | Status: DC
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
## 2024-04-02 NOTE — Patient Instructions (Addendum)
 It was nice to see you today!  Medication Changes: START varenicline  0.5 mg daily for 3 days, then 0.5 mg twice daily for 4 days, then 1 mg twice daily thereafter as directed in the starter pack.  After this you can pick up the prescription for 1 mg twice daily.  Take with food.  Let us  know if you notice a change in your mood or bothersome dreams outside of the usual.  After you start the medication, try to start slowly reducing your cigarettes per day.   Call [facility] Get free tobacco cessation help by calling1-800-QUIT-NOW 807-743-0752); They will provide you free nicotine  patches (for a 2 week supply)  Continue all other medication the same.   I will call you in a couple weeks to check in.   Lorain Baseman, PharmD [facility] [phone]

## 2024-04-04 ENCOUNTER — Other Ambulatory Visit: Payer: Self-pay | Admitting: Nurse Practitioner

## 2024-04-06 ENCOUNTER — Ambulatory Visit (INDEPENDENT_AMBULATORY_CARE_PROVIDER_SITE_OTHER): Admitting: Licensed Clinical Social Worker

## 2024-04-06 DIAGNOSIS — F4310 Post-traumatic stress disorder, unspecified: Secondary | ICD-10-CM
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
## 2024-04-13 ENCOUNTER — Encounter: Payer: Self-pay | Admitting: Physical Therapy

## 2024-04-13 ENCOUNTER — Telehealth: Payer: Self-pay | Admitting: *Deleted

## 2024-04-13 ENCOUNTER — Ambulatory Visit: Admitting: Physical Therapy

## 2024-04-13 DIAGNOSIS — M79605 Pain in left leg: Secondary | ICD-10-CM | POA: Diagnosis not present

## 2024-04-13 DIAGNOSIS — M6281 Muscle weakness (generalized): Secondary | ICD-10-CM | POA: Diagnosis not present

## 2024-04-13 DIAGNOSIS — M542 Cervicalgia: Secondary | ICD-10-CM | POA: Diagnosis not present
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
## 2024-04-13 NOTE — Telephone Encounter (Signed)
 Will froward to [facility].  Referral coordinator. Copied from CRM #[id number]. Topic: Referral - Question >> Apr 10, 2024  2:56 PM Jonathan [name] wrote: Reason for CRM: Ambulatory referral to Urology (Order # [id number]) on 02/17/2024-Patient needs call back on Address and phon# to location to schedule app. [phone]

## 2024-04-16 ENCOUNTER — Other Ambulatory Visit (HOSPITAL_COMMUNITY): Payer: Self-pay

## 2024-04-16 ENCOUNTER — Other Ambulatory Visit: Payer: Self-pay

## 2024-04-16 ENCOUNTER — Other Ambulatory Visit (INDEPENDENT_AMBULATORY_CARE_PROVIDER_SITE_OTHER)

## 2024-04-16 DIAGNOSIS — F17200 Nicotine dependence, unspecified, uncomplicated: Secondary | ICD-10-CM

## 2024-04-16 DIAGNOSIS — F17210 Nicotine dependence, cigarettes, uncomplicated: Secondary | ICD-10-CM

## 2024-04-16 MED ORDER — VARENICLINE TARTRATE 1 MG PO TABS
1.0000 mg | ORAL_TABLET | Freq: Two times a day (BID) | ORAL | 1 refills | Status: DC
  Filled 2024-04-16: qty 180, 90d supply, fill #0

## 2024-04-16 MED ORDER — VARENICLINE TARTRATE (STARTER) 0.5 MG X 11 & 1 MG X 42 PO TBPK
ORAL_TABLET | ORAL | 0 refills | Status: DC
  Filled 2024-04-16: qty 53, 30d supply, fill #0
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
## 2024-04-29 ENCOUNTER — Ambulatory Visit (INDEPENDENT_AMBULATORY_CARE_PROVIDER_SITE_OTHER): Payer: Self-pay | Admitting: Student

## 2024-04-29 VITALS — BP 105/67 | HR 80 | Temp 97.4°F | Ht 73.0 in | Wt 173.4 lb

## 2024-04-29 DIAGNOSIS — M25512 Pain in left shoulder: Secondary | ICD-10-CM

## 2024-04-29 DIAGNOSIS — S56312A Strain of extensor or abductor muscles, fascia and tendons of left thumb at forearm level, initial encounter: Secondary | ICD-10-CM | POA: Diagnosis not present

## 2024-04-29 DIAGNOSIS — T148XXA Other injury of unspecified body region, initial encounter: Secondary | ICD-10-CM

## 2024-04-29 MED ORDER — ACETAMINOPHEN 500 MG PO TABS: 1000.0000 mg | ORAL_TABLET | Freq: Four times a day (QID) | ORAL | 2 refills | Status: DC | PRN

## 2024-04-29 MED ORDER — WRIST/THUMB SPLINT/LEFT LARGE MISC: 0 refills | Status: AC

## 2024-04-29 MED ORDER — LIDOCAINE 5 % EX PTCH: 1.0000 | MEDICATED_PATCH | CUTANEOUS | 0 refills | Status: AC

## 2024-04-29 MED ORDER — DICLOFENAC SODIUM 1 % EX GEL: 4.0000 g | Freq: Four times a day (QID) | CUTANEOUS | 1 refills | Status: DC
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
## 2024-04-29 NOTE — Patient Instructions (Signed)
 Thank you, Mr.Judd L Jerrell Hoops., for allowing us  to provide your care today. Today we discussed . . .  > Pain       - I think the pain in your wrist is due to a strained ligament that helps control your thumb and I think the pain in your neck is due to some tight muscles.  I have a very low suspicion for any fractures that we would need to get an x-ray for.  I would like you to take Tylenol  1000 mg every 8 hours as needed, use a heating pad over your shoulder and neck as needed, Voltaren  gel 4 times daily over both areas, and you can also use lidocaine  patches if you feel these worked better than the Voltaren  gel.  I would also like you to get a thumb spica splint which you can get online or at [facility] for around $10.  If this is not getting better in the next 4 to 6 weeks we will discuss further options such as referral to sports medicine, repeat physical therapy, or other medications.  Please let us  know if any of these pains are getting worse or if you have new symptoms.   Follow up: 4-6 weeks   Remember:  Should you have any questions or concerns please call the internal medicine clinic at [phone].     Fairy Pool, DO [facility]

## 2024-04-30 ENCOUNTER — Ambulatory Visit (INDEPENDENT_AMBULATORY_CARE_PROVIDER_SITE_OTHER)

## 2024-04-30 DIAGNOSIS — F17200 Nicotine dependence, unspecified, uncomplicated: Secondary | ICD-10-CM

## 2024-04-30 DIAGNOSIS — Z79899 Other long term (current) drug therapy: Secondary | ICD-10-CM

## 2024-04-30 DIAGNOSIS — F17210 Nicotine dependence, cigarettes, uncomplicated: Secondary | ICD-10-CM | POA: Diagnosis not present

## 2024-04-30 NOTE — Patient Instructions (Addendum)
 It was nice to see you today!  Call [phone]-NOW ([phone]) if you have not received patches or gum by 05/14/24.  Medication Changes: START using Nicotine  14 mg patch every day (change every 24 hours) for 4 weeks, then decrease to the 7 mg patch for the remaining time period until you have quit  Use nicotine  4 mg gum every 1-2 hours as needed to reduce cravings.   To use nicotine  gum, chew it slowly until you taste a peppery sensation, then park it between your cheek and gum to allow for nicotine  absorption through the mouth's lining. Repeat this chewing and parking process for about 30 minutes until the taste fades, and avoid eating or drinking acidic beverages for 15 minutes before and during use.  Continue all other medication the same.   Monitor blood sugars at home and keep a log (glucometer or piece of paper) to bring with you to your next visit.  Keep up the good work with diet and exercise. Aim for a diet full of vegetables, fruit and lean meats (chicken, [place], fish). Try to limit salt intake by eating fresh or frozen vegetables (instead of canned), rinse canned vegetables prior to cooking and do not add any additional salt to meals.   Lorain Baseman, PharmD [facility] [phone]
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
## 2024-05-08 ENCOUNTER — Ambulatory Visit: Attending: Internal Medicine | Admitting: Physical Therapy

## 2024-05-08 ENCOUNTER — Other Ambulatory Visit: Payer: Self-pay | Admitting: Internal Medicine

## 2024-05-08 ENCOUNTER — Encounter: Payer: Self-pay | Admitting: Physical Therapy

## 2024-05-08 DIAGNOSIS — M6281 Muscle weakness (generalized): Secondary | ICD-10-CM | POA: Insufficient documentation

## 2024-05-08 DIAGNOSIS — M79605 Pain in left leg: Secondary | ICD-10-CM | POA: Insufficient documentation

## 2024-05-08 DIAGNOSIS — M542 Cervicalgia: Secondary | ICD-10-CM | POA: Diagnosis present
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
## 2024-05-08 NOTE — Telephone Encounter (Signed)
 Medication sent to pharmacy

## 2024-05-12 NOTE — Progress Notes (Signed)
 Internal Medicine Clinic Attending  Case discussed with the resident at the time of the visit.  We reviewed the resident's history and exam and pertinent patient test results.  I agree with the assessment, diagnosis, and plan of care documented in the resident's note.

## 2024-05-13 ENCOUNTER — Ambulatory Visit: Admitting: Physical Therapy

## 2024-05-13 DIAGNOSIS — M6281 Muscle weakness (generalized): Secondary | ICD-10-CM

## 2024-05-13 DIAGNOSIS — M542 Cervicalgia: Secondary | ICD-10-CM

## 2024-05-13 DIAGNOSIS — M79605 Pain in left leg: Secondary | ICD-10-CM
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
## 2024-05-15 NOTE — Telephone Encounter (Signed)
 RTN call to pt. NO Hand Referrals have bee placed. LMOM for the patient to call back.  Copied from CRM #[id number]. Topic: Referral - Question >> May 12, 2024  7:56 AM Jonathan [name] wrote: Reason for CRM: Jonathan [name] [id number], needing to speak to someone about a referral for his hand, he states that the brace he was given is not working, he is currently in rehabilitation and wasn't sure if he needed to request a referral or mention the issue with his hand.

## 2024-05-18 ENCOUNTER — Encounter: Payer: Self-pay | Admitting: Physical Therapy

## 2024-05-18 ENCOUNTER — Ambulatory Visit: Admitting: Physical Therapy

## 2024-05-18 DIAGNOSIS — M79605 Pain in left leg: Secondary | ICD-10-CM

## 2024-05-18 DIAGNOSIS — M6281 Muscle weakness (generalized): Secondary | ICD-10-CM

## 2024-05-18 DIAGNOSIS — M542 Cervicalgia: Secondary | ICD-10-CM
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
## 2024-05-21 ENCOUNTER — Other Ambulatory Visit (HOSPITAL_COMMUNITY): Payer: Self-pay

## 2024-05-21 ENCOUNTER — Other Ambulatory Visit

## 2024-05-21 DIAGNOSIS — F17200 Nicotine dependence, unspecified, uncomplicated: Secondary | ICD-10-CM

## 2024-05-21 DIAGNOSIS — E785 Hyperlipidemia, unspecified: Secondary | ICD-10-CM

## 2024-05-21 MED ORDER — ATORVASTATIN CALCIUM 20 MG PO TABS: 20.0000 mg | ORAL_TABLET | Freq: Every day | ORAL | 3 refills | Status: AC
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
## 2024-05-25 ENCOUNTER — Ambulatory Visit: Attending: Internal Medicine | Admitting: Physical Therapy

## 2024-05-25 ENCOUNTER — Encounter: Payer: Self-pay | Admitting: Physical Therapy

## 2024-05-25 DIAGNOSIS — M6281 Muscle weakness (generalized): Secondary | ICD-10-CM | POA: Diagnosis present

## 2024-05-25 DIAGNOSIS — R2689 Other abnormalities of gait and mobility: Secondary | ICD-10-CM | POA: Insufficient documentation

## 2024-05-25 DIAGNOSIS — M79605 Pain in left leg: Secondary | ICD-10-CM | POA: Diagnosis present

## 2024-05-25 DIAGNOSIS — M542 Cervicalgia: Secondary | ICD-10-CM | POA: Insufficient documentation
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
## 2024-05-27 ENCOUNTER — Ambulatory Visit: Admitting: Physical Therapy

## 2024-06-01 ENCOUNTER — Ambulatory Visit: Admitting: Physical Therapy

## 2024-06-03 ENCOUNTER — Ambulatory Visit: Admitting: Physical Therapy

## 2024-06-08 ENCOUNTER — Ambulatory Visit: Admitting: Physical Therapy

## 2024-06-08 ENCOUNTER — Ambulatory Visit: Admitting: Neurology

## 2024-06-25 ENCOUNTER — Telehealth: Payer: Self-pay

## 2024-06-25 ENCOUNTER — Other Ambulatory Visit
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
## 2024-06-25 NOTE — Telephone Encounter (Signed)
 Attempted to contact patient for scheduled appointment for medication management. Left HIPAA compliant message for patient to return my call at their convenience.   Will tentatively reschedule in open slot for telephone call in 2-3 weeks.  Lorain Baseman, PharmD [facility] [phone]

## 2024-06-26 ENCOUNTER — Ambulatory Visit: Admitting: Neurology

## 2024-06-29 ENCOUNTER — Other Ambulatory Visit

## 2024-06-29 DIAGNOSIS — F17200 Nicotine dependence, unspecified, uncomplicated: Secondary | ICD-10-CM
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
## 2024-07-03 ENCOUNTER — Ambulatory Visit: Admitting: Neurology

## 2024-07-06 ENCOUNTER — Other Ambulatory Visit: Payer: Self-pay | Admitting: Nurse Practitioner

## 2024-07-06 ENCOUNTER — Encounter: Payer: Self-pay | Admitting: Neurology

## 2024-07-06 ENCOUNTER — Ambulatory Visit: Admitting: Neurology

## 2024-07-06 VITALS — BP 109/73 | HR 61 | Ht 73.2 in | Wt 177.2 lb

## 2024-07-06 DIAGNOSIS — M25532 Pain in left wrist: Secondary | ICD-10-CM

## 2024-07-06 DIAGNOSIS — R251 Tremor, unspecified: Secondary | ICD-10-CM

## 2024-07-06 NOTE — Patient Instructions (Signed)
 It was nice to meet you today. Thankfully, your hand tremors have improved, I did not see any significant tremor today. For your pain in the wrist, I recommend you talk to your primary care about a referral to orthopedics. Please avoid drinking alcohol altogether currently as you are on gabapentin  and alcohol can interfere with the medication.   Please continue to work on smoking cessation. I do not see a pressing reason to proceed with a brain MRI, you also had a CT scan of the head too long ago. I would be happy to see you back as needed in this clinic.
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
## 2024-07-09 ENCOUNTER — Ambulatory Visit

## 2024-07-09 ENCOUNTER — Ambulatory Visit
Admission: RE | Admit: 2024-07-09 | Discharge: 2024-07-09 | Disposition: A | Source: Ambulatory Visit | Attending: Family Medicine | Admitting: Family Medicine

## 2024-07-09 ENCOUNTER — Other Ambulatory Visit: Payer: Self-pay

## 2024-07-09 VITALS — BP 116/80 | HR 78 | Temp 97.9°F | Ht 73.5 in | Wt 175.4 lb

## 2024-07-09 DIAGNOSIS — M25532 Pain in left wrist: Secondary | ICD-10-CM | POA: Diagnosis not present

## 2024-07-09 DIAGNOSIS — G8929 Other chronic pain: Secondary | ICD-10-CM

## 2024-07-09 MED ORDER — TRAMADOL HCL 50 MG PO TABS: 50.0000 mg | ORAL_TABLET | Freq: Two times a day (BID) | ORAL | 0 refills | Status: AC | PRN
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
## 2024-07-20 ENCOUNTER — Ambulatory Visit (INDEPENDENT_AMBULATORY_CARE_PROVIDER_SITE_OTHER): Payer: Self-pay | Admitting: Student

## 2024-07-20 ENCOUNTER — Other Ambulatory Visit: Payer: Self-pay

## 2024-07-20 VITALS — BP 111/72 | HR 85 | Temp 98.1°F | Ht 73.0 in | Wt 174.6 lb

## 2024-07-20 DIAGNOSIS — S63512A Sprain of carpal joint of left wrist, initial encounter: Secondary | ICD-10-CM | POA: Diagnosis not present

## 2024-07-20 DIAGNOSIS — M25532 Pain in left wrist: Secondary | ICD-10-CM

## 2024-07-20 DIAGNOSIS — M25332 Other instability, left wrist: Secondary | ICD-10-CM

## 2024-07-20 MED ORDER — TRAMADOL HCL 50 MG PO TABS: 50.0000 mg | ORAL_TABLET | Freq: Two times a day (BID) | ORAL | 0 refills | Status: AC | PRN

## 2024-07-20 MED ORDER — ACETAMINOPHEN 500 MG PO TABS: 1000.0000 mg | ORAL_TABLET | Freq: Three times a day (TID) | ORAL | 2 refills | Status: AC | PRN

## 2024-07-20 MED ORDER — DICLOFENAC SODIUM 1 % EX GEL: 4.0000 g | Freq: Four times a day (QID) | CUTANEOUS | 1 refills | Status: AC
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
## 2024-07-20 NOTE — Progress Notes (Signed)
 Internal Medicine Clinic Attending  Case discussed with the resident at the time of the visit.  We reviewed the resident's history and exam and pertinent patient test results.  I agree with the assessment, diagnosis, and plan of care documented in the resident's note.

## 2024-07-20 NOTE — Patient Instructions (Signed)
 Thank you, [name]., for allowing us  to provide your care today. Today we discussed . . .  > Scapholunate dissociation       - The pain in your left wrist is caused by an injury to the scapholunate ligament.  We were able to see this on the x-ray of your wrist but the x-ray does not show enough detail of the ligament to show if it is partially torn or completely torn or any other extent of the injury but it does show evidence that this ligament is injured.  Today we are going to refer you to a hand surgeon and they should call you about scheduling your visit.  Please continue to take Tylenol  1000 mg every 8 hours as needed, Voltaren  gel every 6 hours as needed, and I have refilled your tramadol  which you can use 50 mg every 12 hours as needed especially if it helps you be able to sleep through the pain.  Please let us  know if you have any new or worsening symptoms or you have any issue getting an appointment with the hand surgeon.    Referrals ordered today:    Referral Orders         Ambulatory referral to Hand Surgery       Follow up: 3 months    Remember:  Should you have any questions or concerns please call the internal medicine clinic at [phone].     Fairy Pool, DO [facility]

## 2024-07-30 NOTE — Progress Notes (Signed)
 Internal Medicine Clinic Attending  I was physically present during the key portions of the resident provided service and participated in the medical decision making of patient's management care. I reviewed pertinent patient test results.  The assessment, diagnosis, and plan were formulated together and I agree with the documentation in the resident's note.  Jeanelle Layman CROME, MD

## 2024-08-02 ENCOUNTER — Other Ambulatory Visit: Payer: Self-pay | Admitting: Internal Medicine
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
## 2024-08-03 ENCOUNTER — Ambulatory Visit: Admitting: Orthopedic Surgery

## 2024-08-03 DIAGNOSIS — M25332 Other instability, left wrist: Secondary | ICD-10-CM | POA: Diagnosis not present

## 2024-08-03 DIAGNOSIS — M25532 Pain in left wrist: Secondary | ICD-10-CM | POA: Diagnosis not present

## 2024-08-03 NOTE — Telephone Encounter (Signed)
 Medication sent to pharmacy

## 2024-08-24 ENCOUNTER — Inpatient Hospital Stay: Admission: RE | Admit: 2024-08-24 | Source: Ambulatory Visit

## 2024-08-24 ENCOUNTER — Other Ambulatory Visit

## 2024-09-16 ENCOUNTER — Other Ambulatory Visit

## 2025-01-04 ENCOUNTER — Ambulatory Visit: Admitting: "Endocrinology

## 2025-02-19 ENCOUNTER — Other Ambulatory Visit
